# Patient Record
Sex: Female | Born: 1945 | Race: White | Hispanic: No | Marital: Married | Smoking: Former smoker
Health system: Southern US, Community
[De-identification: ages and names within clinical notes are randomized; demographics above are authoritative.]

## PROBLEM LIST (undated history)

## (undated) DIAGNOSIS — F419 Anxiety disorder, unspecified: Secondary | ICD-10-CM

## (undated) DIAGNOSIS — N289 Disorder of kidney and ureter, unspecified: Secondary | ICD-10-CM

## (undated) DIAGNOSIS — M5137 Other intervertebral disc degeneration, lumbosacral region: Secondary | ICD-10-CM

## (undated) DIAGNOSIS — I10 Essential (primary) hypertension: Secondary | ICD-10-CM

## (undated) DIAGNOSIS — K219 Gastro-esophageal reflux disease without esophagitis: Secondary | ICD-10-CM

## (undated) DIAGNOSIS — Z961 Presence of intraocular lens: Secondary | ICD-10-CM

## (undated) DIAGNOSIS — F17200 Nicotine dependence, unspecified, uncomplicated: Secondary | ICD-10-CM

## (undated) DIAGNOSIS — E669 Obesity, unspecified: Secondary | ICD-10-CM

## (undated) DIAGNOSIS — E785 Hyperlipidemia, unspecified: Secondary | ICD-10-CM

## (undated) DIAGNOSIS — C4491 Basal cell carcinoma of skin, unspecified: Secondary | ICD-10-CM

## (undated) DIAGNOSIS — R5381 Other malaise: Secondary | ICD-10-CM

## (undated) DIAGNOSIS — R7309 Other abnormal glucose: Secondary | ICD-10-CM

## (undated) DIAGNOSIS — K831 Obstruction of bile duct: Secondary | ICD-10-CM

## (undated) DIAGNOSIS — Z860100 Personal history of colon polyps, unspecified: Secondary | ICD-10-CM

## (undated) DIAGNOSIS — I708 Atherosclerosis of other arteries: Secondary | ICD-10-CM

## (undated) DIAGNOSIS — L219 Seborrheic dermatitis, unspecified: Secondary | ICD-10-CM

## (undated) DIAGNOSIS — R0989 Other specified symptoms and signs involving the circulatory and respiratory systems: Secondary | ICD-10-CM

## (undated) DIAGNOSIS — I700 Atherosclerosis of aorta: Secondary | ICD-10-CM

## (undated) DIAGNOSIS — F32A Depression, unspecified: Secondary | ICD-10-CM

## (undated) DIAGNOSIS — F329 Major depressive disorder, single episode, unspecified: Secondary | ICD-10-CM

## (undated) DIAGNOSIS — R197 Diarrhea, unspecified: Secondary | ICD-10-CM

## (undated) DIAGNOSIS — M1990 Unspecified osteoarthritis, unspecified site: Secondary | ICD-10-CM

## (undated) DIAGNOSIS — K8591 Acute pancreatitis with uninfected necrosis, unspecified: Secondary | ICD-10-CM

## (undated) DIAGNOSIS — Z8489 Family history of other specified conditions: Secondary | ICD-10-CM

## (undated) DIAGNOSIS — I2510 Atherosclerotic heart disease of native coronary artery without angina pectoris: Secondary | ICD-10-CM

## (undated) DIAGNOSIS — K859 Acute pancreatitis without necrosis or infection, unspecified: Secondary | ICD-10-CM

## (undated) DIAGNOSIS — K5730 Diverticulosis of large intestine without perforation or abscess without bleeding: Secondary | ICD-10-CM

## (undated) DIAGNOSIS — H3530 Unspecified macular degeneration: Secondary | ICD-10-CM

## (undated) DIAGNOSIS — Z8719 Personal history of other diseases of the digestive system: Secondary | ICD-10-CM

## (undated) DIAGNOSIS — K648 Other hemorrhoids: Secondary | ICD-10-CM

## (undated) DIAGNOSIS — N1831 Chronic kidney disease, stage 3a: Secondary | ICD-10-CM

## (undated) DIAGNOSIS — R0609 Other forms of dyspnea: Secondary | ICD-10-CM

## (undated) DIAGNOSIS — D126 Benign neoplasm of colon, unspecified: Secondary | ICD-10-CM

## (undated) DIAGNOSIS — R5383 Other fatigue: Secondary | ICD-10-CM

## (undated) DIAGNOSIS — M51379 Other intervertebral disc degeneration, lumbosacral region without mention of lumbar back pain or lower extremity pain: Secondary | ICD-10-CM

## (undated) DIAGNOSIS — E119 Type 2 diabetes mellitus without complications: Secondary | ICD-10-CM

## (undated) HISTORY — DX: Anxiety disorder, unspecified: F41.9

## (undated) HISTORY — PX: EYE SURGERY: SHX253

## (undated) HISTORY — DX: Unspecified macular degeneration: H35.30

## (undated) HISTORY — DX: Nicotine dependence, unspecified, uncomplicated: F17.200

## (undated) HISTORY — DX: Basal cell carcinoma of skin, unspecified: C44.91

## (undated) HISTORY — PX: KNEE SURGERY: SHX244

## (undated) HISTORY — DX: Other intervertebral disc degeneration, lumbosacral region without mention of lumbar back pain or lower extremity pain: M51.379

## (undated) HISTORY — DX: Essential (primary) hypertension: I10

## (undated) HISTORY — DX: Other intervertebral disc degeneration, lumbosacral region: M51.37

## (undated) HISTORY — DX: Other hemorrhoids: K64.8

## (undated) HISTORY — PX: DILATION AND CURETTAGE OF UTERUS: SHX78

## (undated) HISTORY — PX: CHOLECYSTECTOMY: SHX55

## (undated) HISTORY — DX: Hyperlipidemia, unspecified: E78.5

## (undated) HISTORY — DX: Atherosclerotic heart disease of native coronary artery without angina pectoris: I25.10

## (undated) HISTORY — PX: OTHER SURGICAL HISTORY: SHX169

## (undated) HISTORY — PX: JOINT REPLACEMENT: SHX530

## (undated) HISTORY — DX: Other forms of dyspnea: R06.09

## (undated) HISTORY — DX: Obesity, unspecified: E66.9

## (undated) HISTORY — DX: Diverticulosis of large intestine without perforation or abscess without bleeding: K57.30

## (undated) HISTORY — DX: Other malaise: R53.81

## (undated) HISTORY — DX: Depression, unspecified: F32.A

## (undated) HISTORY — DX: Seborrheic dermatitis, unspecified: L21.9

## (undated) HISTORY — DX: Benign neoplasm of colon, unspecified: D12.6

## (undated) HISTORY — DX: Obstruction of bile duct: K83.1

## (undated) HISTORY — DX: Disorder of kidney and ureter, unspecified: N28.9

## (undated) HISTORY — DX: Other fatigue: R53.83

## (undated) HISTORY — DX: Other specified symptoms and signs involving the circulatory and respiratory systems: R09.89

## (undated) HISTORY — DX: Other abnormal glucose: R73.09

## (undated) HISTORY — DX: Acute pancreatitis without necrosis or infection, unspecified: K85.90

## (undated) HISTORY — DX: Gastro-esophageal reflux disease without esophagitis: K21.9

## (undated) HISTORY — DX: Major depressive disorder, single episode, unspecified: F32.9

---

## 1980-04-07 HISTORY — PX: TUBAL LIGATION: SHX77

## 2004-04-07 HISTORY — PX: CORONARY ANGIOPLASTY: SHX604

## 2004-04-07 HISTORY — PX: CARDIAC CATHETERIZATION: SHX172

## 2006-03-04 ENCOUNTER — Other Ambulatory Visit: Payer: Self-pay

## 2006-03-06 ENCOUNTER — Ambulatory Visit: Payer: Self-pay | Admitting: Specialist

## 2006-07-17 ENCOUNTER — Other Ambulatory Visit: Payer: Self-pay

## 2006-07-18 ENCOUNTER — Inpatient Hospital Stay: Payer: Self-pay | Admitting: Internal Medicine

## 2006-12-29 ENCOUNTER — Ambulatory Visit: Payer: Self-pay | Admitting: Specialist

## 2008-04-07 HISTORY — PX: KNEE SURGERY: SHX244

## 2009-01-15 ENCOUNTER — Ambulatory Visit: Payer: Self-pay | Admitting: Gastroenterology

## 2009-01-15 LAB — HM COLONOSCOPY

## 2009-07-26 ENCOUNTER — Ambulatory Visit: Payer: Self-pay | Admitting: Family Medicine

## 2010-04-07 HISTORY — PX: OTHER SURGICAL HISTORY: SHX169

## 2011-02-06 ENCOUNTER — Ambulatory Visit: Payer: Self-pay | Admitting: Family Medicine

## 2011-02-06 LAB — HM MAMMOGRAPHY: HM Mammogram: NEGATIVE

## 2011-05-01 DIAGNOSIS — L219 Seborrheic dermatitis, unspecified: Secondary | ICD-10-CM | POA: Diagnosis not present

## 2011-05-01 DIAGNOSIS — L819 Disorder of pigmentation, unspecified: Secondary | ICD-10-CM | POA: Diagnosis not present

## 2011-05-01 DIAGNOSIS — L570 Actinic keratosis: Secondary | ICD-10-CM | POA: Diagnosis not present

## 2011-05-30 DIAGNOSIS — H353290 Exudative age-related macular degeneration, unspecified eye, stage unspecified: Secondary | ICD-10-CM | POA: Diagnosis not present

## 2011-06-16 DIAGNOSIS — H353290 Exudative age-related macular degeneration, unspecified eye, stage unspecified: Secondary | ICD-10-CM | POA: Diagnosis not present

## 2011-07-07 DIAGNOSIS — I209 Angina pectoris, unspecified: Secondary | ICD-10-CM | POA: Diagnosis not present

## 2011-07-07 DIAGNOSIS — E782 Mixed hyperlipidemia: Secondary | ICD-10-CM | POA: Diagnosis not present

## 2011-07-07 DIAGNOSIS — I119 Hypertensive heart disease without heart failure: Secondary | ICD-10-CM | POA: Diagnosis not present

## 2011-07-07 DIAGNOSIS — I495 Sick sinus syndrome: Secondary | ICD-10-CM | POA: Diagnosis not present

## 2011-07-16 DIAGNOSIS — E782 Mixed hyperlipidemia: Secondary | ICD-10-CM | POA: Diagnosis not present

## 2011-07-16 DIAGNOSIS — I209 Angina pectoris, unspecified: Secondary | ICD-10-CM | POA: Diagnosis not present

## 2011-07-16 DIAGNOSIS — R0602 Shortness of breath: Secondary | ICD-10-CM | POA: Diagnosis not present

## 2011-07-16 DIAGNOSIS — I119 Hypertensive heart disease without heart failure: Secondary | ICD-10-CM | POA: Diagnosis not present

## 2011-07-16 DIAGNOSIS — I495 Sick sinus syndrome: Secondary | ICD-10-CM | POA: Diagnosis not present

## 2011-07-21 DIAGNOSIS — H35019 Changes in retinal vascular appearance, unspecified eye: Secondary | ICD-10-CM | POA: Diagnosis not present

## 2011-07-21 DIAGNOSIS — H353290 Exudative age-related macular degeneration, unspecified eye, stage unspecified: Secondary | ICD-10-CM | POA: Diagnosis not present

## 2011-08-05 DIAGNOSIS — F329 Major depressive disorder, single episode, unspecified: Secondary | ICD-10-CM | POA: Diagnosis not present

## 2011-08-05 DIAGNOSIS — R7309 Other abnormal glucose: Secondary | ICD-10-CM | POA: Diagnosis not present

## 2011-08-05 DIAGNOSIS — I10 Essential (primary) hypertension: Secondary | ICD-10-CM | POA: Diagnosis not present

## 2011-08-05 DIAGNOSIS — E7800 Pure hypercholesterolemia, unspecified: Secondary | ICD-10-CM | POA: Diagnosis not present

## 2011-08-05 DIAGNOSIS — F3289 Other specified depressive episodes: Secondary | ICD-10-CM | POA: Diagnosis not present

## 2011-08-20 DIAGNOSIS — E782 Mixed hyperlipidemia: Secondary | ICD-10-CM | POA: Diagnosis not present

## 2011-08-20 DIAGNOSIS — R7309 Other abnormal glucose: Secondary | ICD-10-CM | POA: Diagnosis not present

## 2011-09-08 DIAGNOSIS — H353290 Exudative age-related macular degeneration, unspecified eye, stage unspecified: Secondary | ICD-10-CM | POA: Diagnosis not present

## 2011-09-23 DIAGNOSIS — I10 Essential (primary) hypertension: Secondary | ICD-10-CM | POA: Diagnosis not present

## 2011-09-23 DIAGNOSIS — I059 Rheumatic mitral valve disease, unspecified: Secondary | ICD-10-CM | POA: Diagnosis not present

## 2011-09-23 DIAGNOSIS — E782 Mixed hyperlipidemia: Secondary | ICD-10-CM | POA: Diagnosis not present

## 2011-09-23 DIAGNOSIS — K3189 Other diseases of stomach and duodenum: Secondary | ICD-10-CM | POA: Diagnosis not present

## 2011-09-23 DIAGNOSIS — R1013 Epigastric pain: Secondary | ICD-10-CM | POA: Diagnosis not present

## 2011-10-13 DIAGNOSIS — R7309 Other abnormal glucose: Secondary | ICD-10-CM | POA: Diagnosis not present

## 2011-10-13 DIAGNOSIS — F3289 Other specified depressive episodes: Secondary | ICD-10-CM | POA: Diagnosis not present

## 2011-10-13 DIAGNOSIS — M5430 Sciatica, unspecified side: Secondary | ICD-10-CM | POA: Diagnosis not present

## 2011-10-13 DIAGNOSIS — M5137 Other intervertebral disc degeneration, lumbosacral region: Secondary | ICD-10-CM | POA: Diagnosis not present

## 2011-10-13 DIAGNOSIS — H353290 Exudative age-related macular degeneration, unspecified eye, stage unspecified: Secondary | ICD-10-CM | POA: Diagnosis not present

## 2011-10-13 DIAGNOSIS — F329 Major depressive disorder, single episode, unspecified: Secondary | ICD-10-CM | POA: Diagnosis not present

## 2011-10-20 ENCOUNTER — Ambulatory Visit: Payer: Self-pay | Admitting: Family Medicine

## 2011-10-20 DIAGNOSIS — M5137 Other intervertebral disc degeneration, lumbosacral region: Secondary | ICD-10-CM | POA: Diagnosis not present

## 2011-10-20 DIAGNOSIS — M5430 Sciatica, unspecified side: Secondary | ICD-10-CM | POA: Diagnosis not present

## 2011-11-24 DIAGNOSIS — H353290 Exudative age-related macular degeneration, unspecified eye, stage unspecified: Secondary | ICD-10-CM | POA: Diagnosis not present

## 2012-01-02 ENCOUNTER — Telehealth: Payer: Self-pay

## 2012-01-02 NOTE — Telephone Encounter (Signed)
Call --- the next step is to see an orthopedist.  Does she have an orthopedist or has she recently seen an orthopedist for her symptoms?  KMS

## 2012-01-02 NOTE — Telephone Encounter (Signed)
Dr. Katrinka Blazing: The last time she saw you in [place], she was having a sciatic nerve problem with foot tingling and it has not gone away. She may need an MRI on her back. Please call her at [phone]. She is not a patient of [facility] at this time.

## 2012-01-03 NOTE — Telephone Encounter (Signed)
Pt saw Dr Jeral Fruit in Nov of 2011.

## 2012-01-05 NOTE — Telephone Encounter (Signed)
Dr Katrinka Blazing, Dr Jeral Fruit is a NS, do we need to refer her to an ortho specialist?

## 2012-01-06 NOTE — Telephone Encounter (Signed)
Call ---patient will need referral to ortho to start with or she can try to contact [facility]'s office for an appointment. I cannot make referral because I do not have her records at [facility] and she has not established as a patient at this office yet.   She can either: 1.  call [facility] for referral to ortho for persistent sciatica pain.  2. See me for appointment or at Urgent Care in upcoming 1-2 weeks.  KMS

## 2012-01-08 NOTE — Telephone Encounter (Signed)
Spoke w/pt and gave her options from Dr Katrinka Blazing. Pt stated she will prob start by calling Dr Cassandria Santee office and see if she can get an appt there. She will CB for Dr Michaelle Copas hours/appts if needed.

## 2012-01-09 DIAGNOSIS — H353290 Exudative age-related macular degeneration, unspecified eye, stage unspecified: Secondary | ICD-10-CM | POA: Diagnosis not present

## 2012-01-28 DIAGNOSIS — L821 Other seborrheic keratosis: Secondary | ICD-10-CM | POA: Diagnosis not present

## 2012-01-28 DIAGNOSIS — L570 Actinic keratosis: Secondary | ICD-10-CM | POA: Diagnosis not present

## 2012-01-28 DIAGNOSIS — L219 Seborrheic dermatitis, unspecified: Secondary | ICD-10-CM | POA: Diagnosis not present

## 2012-02-09 DIAGNOSIS — M545 Low back pain, unspecified: Secondary | ICD-10-CM | POA: Diagnosis not present

## 2012-02-09 DIAGNOSIS — IMO0002 Reserved for concepts with insufficient information to code with codable children: Secondary | ICD-10-CM | POA: Diagnosis not present

## 2012-02-11 ENCOUNTER — Other Ambulatory Visit: Payer: Self-pay | Admitting: Neurosurgery

## 2012-02-11 DIAGNOSIS — M5410 Radiculopathy, site unspecified: Secondary | ICD-10-CM

## 2012-02-11 DIAGNOSIS — M549 Dorsalgia, unspecified: Secondary | ICD-10-CM

## 2012-02-17 ENCOUNTER — Ambulatory Visit
Admission: RE | Admit: 2012-02-17 | Discharge: 2012-02-17 | Disposition: A | Discharge status: Home / Self Care | Payer: Medicare Other | Source: Ambulatory Visit | Attending: Neurosurgery | Admitting: Neurosurgery

## 2012-02-17 DIAGNOSIS — M5410 Radiculopathy, site unspecified: Secondary | ICD-10-CM

## 2012-02-17 DIAGNOSIS — M549 Dorsalgia, unspecified: Secondary | ICD-10-CM

## 2012-02-19 ENCOUNTER — Ambulatory Visit
Admission: RE | Admit: 2012-02-19 | Discharge: 2012-02-19 | Disposition: A | Discharge status: Home / Self Care | Payer: Medicare Other | Source: Ambulatory Visit | Attending: Neurosurgery | Admitting: Neurosurgery

## 2012-02-19 VITALS — BP 117/54 | HR 58 | Ht 68.0 in | Wt 240.0 lb

## 2012-02-19 DIAGNOSIS — M545 Low back pain, unspecified: Secondary | ICD-10-CM | POA: Diagnosis not present

## 2012-02-19 DIAGNOSIS — M5126 Other intervertebral disc displacement, lumbar region: Secondary | ICD-10-CM | POA: Diagnosis not present

## 2012-02-19 DIAGNOSIS — M549 Dorsalgia, unspecified: Secondary | ICD-10-CM

## 2012-02-19 MED ORDER — IOHEXOL 180 MG/ML  SOLN
15.0000 mL | Freq: Once | INTRAMUSCULAR | Status: AC | PRN
  Administered 2012-02-19: 15 mL via INTRATHECAL

## 2012-02-19 MED ORDER — DIAZEPAM 5 MG PO TABS
10.0000 mg | ORAL_TABLET | Freq: Once | ORAL | Status: AC
  Administered 2012-02-19: 5 mg via ORAL

## 2012-02-19 NOTE — Progress Notes (Signed)
Pt states she has been off of lexapro for the past 2 days. dd

## 2012-02-23 DIAGNOSIS — H353290 Exudative age-related macular degeneration, unspecified eye, stage unspecified: Secondary | ICD-10-CM | POA: Diagnosis not present

## 2012-03-10 DIAGNOSIS — K2100 Gastro-esophageal reflux disease with esophagitis, without bleeding: Secondary | ICD-10-CM | POA: Diagnosis not present

## 2012-03-10 DIAGNOSIS — I10 Essential (primary) hypertension: Secondary | ICD-10-CM | POA: Diagnosis not present

## 2012-03-10 DIAGNOSIS — E785 Hyperlipidemia, unspecified: Secondary | ICD-10-CM | POA: Diagnosis not present

## 2012-03-10 DIAGNOSIS — I2510 Atherosclerotic heart disease of native coronary artery without angina pectoris: Secondary | ICD-10-CM | POA: Diagnosis not present

## 2012-03-15 DIAGNOSIS — M545 Low back pain, unspecified: Secondary | ICD-10-CM | POA: Diagnosis not present

## 2012-03-15 DIAGNOSIS — M47817 Spondylosis without myelopathy or radiculopathy, lumbosacral region: Secondary | ICD-10-CM | POA: Diagnosis not present

## 2012-03-15 DIAGNOSIS — IMO0002 Reserved for concepts with insufficient information to code with codable children: Secondary | ICD-10-CM | POA: Diagnosis not present

## 2012-03-16 ENCOUNTER — Encounter: Payer: Self-pay | Admitting: Family Medicine

## 2012-03-16 ENCOUNTER — Ambulatory Visit (INDEPENDENT_AMBULATORY_CARE_PROVIDER_SITE_OTHER): Payer: Medicare Other | Admitting: Family Medicine

## 2012-03-16 VITALS — BP 140/84 | HR 62 | Temp 99.1°F | Resp 18 | Ht 68.5 in | Wt 240.0 lb

## 2012-03-16 DIAGNOSIS — Z23 Encounter for immunization: Secondary | ICD-10-CM | POA: Diagnosis not present

## 2012-03-16 DIAGNOSIS — R7309 Other abnormal glucose: Secondary | ICD-10-CM

## 2012-03-16 DIAGNOSIS — F329 Major depressive disorder, single episode, unspecified: Secondary | ICD-10-CM | POA: Diagnosis not present

## 2012-03-16 DIAGNOSIS — F3289 Other specified depressive episodes: Secondary | ICD-10-CM | POA: Diagnosis not present

## 2012-03-16 DIAGNOSIS — E7800 Pure hypercholesterolemia, unspecified: Secondary | ICD-10-CM | POA: Diagnosis not present

## 2012-03-16 DIAGNOSIS — Z1239 Encounter for other screening for malignant neoplasm of breast: Secondary | ICD-10-CM

## 2012-03-16 DIAGNOSIS — F32A Depression, unspecified: Secondary | ICD-10-CM

## 2012-03-16 DIAGNOSIS — I2510 Atherosclerotic heart disease of native coronary artery without angina pectoris: Secondary | ICD-10-CM | POA: Insufficient documentation

## 2012-03-16 DIAGNOSIS — I10 Essential (primary) hypertension: Secondary | ICD-10-CM

## 2012-03-16 LAB — CBC WITH DIFFERENTIAL/PLATELET
Eosinophils Relative: 3 % (ref 0–5)
HCT: 37.6 % (ref 36.0–46.0)
Lymphocytes Relative: 26 % (ref 12–46)
Lymphs Abs: 1.7 10*3/uL (ref 0.7–4.0)
MCV: 88.1 fL (ref 78.0–100.0)
Monocytes Absolute: 0.6 10*3/uL (ref 0.1–1.0)
Monocytes Relative: 10 % (ref 3–12)
RBC: 4.27 MIL/uL (ref 3.87–5.11)
WBC: 6.4 10*3/uL (ref 4.0–10.5)

## 2012-03-16 LAB — LIPID PANEL
Cholesterol: 209 mg/dL — ABNORMAL HIGH (ref 0–200)
HDL: 51 mg/dL (ref >39)
Total CHOL/HDL Ratio: 4.1 Ratio
Triglycerides: 138 mg/dL (ref <150)
VLDL: 28 mg/dL (ref 0–40)

## 2012-03-16 LAB — COMPREHENSIVE METABOLIC PANEL
Albumin: 4.5 g/dL (ref 3.5–5.2)
Alkaline Phosphatase: 61 U/L (ref 39–117)
CO2: 28 mEq/L (ref 19–32)
Chloride: 100 mEq/L (ref 96–112)
Glucose, Bld: 102 mg/dL — ABNORMAL HIGH (ref 70–99)
Potassium: 5.4 mEq/L — ABNORMAL HIGH (ref 3.5–5.3)
Sodium: 136 mEq/L (ref 135–145)
Total Protein: 6.9 g/dL (ref 6.0–8.3)

## 2012-03-16 NOTE — Assessment & Plan Note (Signed)
Administered in office.

## 2012-03-16 NOTE — Assessment & Plan Note (Signed)
Stable; s/p recent cardiology follow-up last week.  Asymptomatic.

## 2012-03-16 NOTE — Assessment & Plan Note (Signed)
Stable despite recent marital stressors; counseling provided in office; no changes in medication; good family support from children.  Consider further psychotherapy if emotionally declines.

## 2012-03-16 NOTE — Assessment & Plan Note (Signed)
Moderately controlled; Gail Burns added HCTZ this week; obtain labs.  Continue current medications.

## 2012-03-16 NOTE — Assessment & Plan Note (Signed)
Controlled; no changes to management; obtain labs.

## 2012-03-16 NOTE — Assessment & Plan Note (Signed)
Refer for mammogram.  

## 2012-03-16 NOTE — Patient Instructions (Addendum)
1. Pure hypercholesterolemia  CBC with Differential, Comprehensive metabolic panel, Lipid panel, CK  2. Other abnormal glucose  Hemoglobin A1c, Comprehensive metabolic panel  3. Essential hypertension, benign    4. Coronary artery disease    5. Depression    6. Need for prophylactic vaccination and inoculation against influenza  Flu vaccine greater than or equal to 68yo preservative free IM  7. Screening for breast cancer  MM Digital Screening

## 2012-03-16 NOTE — Progress Notes (Signed)
[address]   [place], [place]  [address]   [phone]  Subjective:    Patient ID: Gail Burns, female    DOB: [birth year], 66 y.o.   MRN: [id number]  HPIThis 66 y.o. female presents to establish care and for six month follow-up:  1. Sciatica:  Saw Dr. Phineas Semen; ordered myelogram and CT scan; follow-up yesterday.  +DDD lumbar; recommended surgery.  Discussed steroid injections x 3; has undergone two injections in past; RLE; R calf feels really tight.  Considering surgery.  Recommended weight loss.    2.  HTN:  Six month follow-up; no changes to management made at last visit ;recent BP at cardiology/[facility]alski's office of 145/80s.  Started diuretic today for elevated blood pressure per Dr. Gwen Pounds.  Denies CP/palp/SOB/leg swelling. Denies HA/dizziness/focal weakness/paresthesias.  3. Hyperlipidemia:  Six month follow-up; no changes to management made at last visit. Reports compliance with medication; good tolerance to medication; good symptom control.   4.  Glucose Intolerance: continues with attempts of dietary modification; no formal exercise for past two months.  Denies polydipsia or polyphagia or polyuria.  5. Depression:  Stable despite major marital stressors.  Husband charged for two attempts of picking up prostitute in past six months.  Husband told patient of charges.  Does not trust husband at all now. Not sexually active with husband due to atrophic vaginitis.  Very angry with husband.  Kicked husband out with first charges but had him return home after a few weeks.  Second charge, kicked him out of the bed for one month.  Married x 43+years; cannot live without him; loves him. Has not told siblings.  Has told children.    6 CAD:  S/p cardiology consultation last week; started diuretic.  Gwen Pounds.  No cardiovascular studies.  7. Breast cancer screening: due for mammogram.  8.  Flu vaccine: agreeable.   Review of Systems  Constitutional: Negative for fever, chills, diaphoresis  and fatigue.  Respiratory: Positive for shortness of breath.        No worsening DOE.  Cardiovascular: Negative for chest pain, palpitations and leg swelling.  Gastrointestinal: Negative for vomiting and diarrhea.  Musculoskeletal: Positive for back pain.  Neurological: Positive for numbness. Negative for dizziness, tremors, seizures, syncope, facial asymmetry, speech difficulty, weakness, light-headedness and headaches.  Psychiatric/Behavioral: Positive for dysphoric mood. Negative for suicidal ideas, sleep disturbance and self-injury. The patient is nervous/anxious.         Past Medical History  Diagnosis Date  . Anxiety   . Depression   . Hyperlipidemia   . Hypertension     Past Surgical History  Procedure Date  . Cardiac catheterization 2006    80% blockage    Prior to Admission medications   Medication Sig Start Date End Date Taking? Authorizing Provider  aspirin 81 MG tablet Take 81 mg by mouth daily.   Yes Historical Provider, MD  calcium carbonate (OS-CAL) 600 MG TABS Take 600 mg by mouth 2 (two) times daily with a meal.   Yes Historical Provider, MD  chlorthalidone (HYGROTON) 25 MG tablet Take 25 mg by mouth daily.   Yes Historical Provider, MD  escitalopram (LEXAPRO) 20 MG tablet Take 20 mg by mouth daily.   Yes Historical Provider, MD  esomeprazole (NEXIUM) 40 MG capsule Take 40 mg by mouth daily before breakfast.   Yes Historical Provider, MD  fenofibrate (TRICOR) 145 MG tablet Take 145 mg by mouth daily.   Yes Historical Provider, MD  KRILL OIL 1000 MG CAPS Take  by mouth.   Yes Historical Provider, MD  lisinopril (PRINIVIL,ZESTRIL) 20 MG tablet Take 20 mg by mouth daily.   Yes Historical Provider, MD  meloxicam (MOBIC) 15 MG tablet Take 15 mg by mouth daily.   Yes Historical Provider, MD  rosuvastatin (CRESTOR) 40 MG tablet Take 40 mg by mouth daily.   Yes Historical Provider, MD    Allergies  Allergen Reactions  . Augmentin (Amoxicillin-Pot Clavulanate) Hives      History   Social History  . Marital Status: Married    Spouse Name: N/A    Number of Children: N/A  . Years of Education: N/A   Occupational History  . Not on file.   Social History Main Topics  . Smoking status: Former Smoker -- 0.5 packs/day for 30 years    Types: Cigarettes    Quit date: 02/18/1990  . Smokeless tobacco: Not on file  . Alcohol Use: Yes  . Drug Use: No  . Sexually Active: Not Currently     Comment: due to atrophic vaginitis   Other Topics Concern  . Not on file   Social History Narrative   Marital status: married   Children: live in [place]       Family History  Problem Relation Age of Onset  . Cancer Mother   . Heart disease Mother   . Heart disease Father   . Heart disease Sister   . Heart disease Brother   . Heart disease Maternal Grandfather     Objective:   Physical Exam  Nursing note and vitals reviewed. Constitutional: She is oriented to person, place, and time. She appears well-developed and well-nourished. No distress.  Eyes: Conjunctivae normal are normal. Pupils are equal, round, and reactive to light.  Neck: Normal range of motion. Neck supple. No thyromegaly present.  Cardiovascular: Normal rate, regular rhythm, normal heart sounds and intact distal pulses.  Exam reveals no gallop and no friction rub.   No murmur heard. Pulmonary/Chest: Effort normal and breath sounds normal. She has no wheezes. She has no rales.  Abdominal: Soft. Bowel sounds are normal.  Lymphadenopathy:    She has no cervical adenopathy.  Neurological: She is alert and oriented to person, place, and time. No cranial nerve deficit. She exhibits normal muscle tone. Coordination normal.  Skin: She is not diaphoretic.  Psychiatric: She has a normal mood and affect. Her behavior is normal. Thought content normal.     INFLUENZA VACCINE ADMINISTERED.     Assessment & Plan:   1. Pure hypercholesterolemia  CBC with Differential, Comprehensive metabolic panel,  Lipid panel, CK  2. Other abnormal glucose  Hemoglobin A1c, Comprehensive metabolic panel  3. Essential hypertension, benign    4. Coronary artery disease    5. Depression    6. Need for prophylactic vaccination and inoculation against influenza  Flu vaccine greater than or equal to 3yo preservative free IM  7. Screening for breast cancer  MM Digital Screening

## 2012-03-16 NOTE — Assessment & Plan Note (Signed)
Stable; obtain labs; continue with dietary modification.

## 2012-03-21 ENCOUNTER — Encounter: Payer: Self-pay | Admitting: Radiology

## 2012-03-24 DIAGNOSIS — L819 Disorder of pigmentation, unspecified: Secondary | ICD-10-CM | POA: Diagnosis not present

## 2012-03-24 DIAGNOSIS — L570 Actinic keratosis: Secondary | ICD-10-CM | POA: Diagnosis not present

## 2012-03-24 DIAGNOSIS — L988 Other specified disorders of the skin and subcutaneous tissue: Secondary | ICD-10-CM | POA: Diagnosis not present

## 2012-04-05 ENCOUNTER — Inpatient Hospital Stay: Payer: Self-pay | Admitting: Internal Medicine

## 2012-04-05 DIAGNOSIS — F3289 Other specified depressive episodes: Secondary | ICD-10-CM | POA: Diagnosis present

## 2012-04-05 DIAGNOSIS — H3530 Unspecified macular degeneration: Secondary | ICD-10-CM | POA: Diagnosis present

## 2012-04-05 DIAGNOSIS — Z7982 Long term (current) use of aspirin: Secondary | ICD-10-CM | POA: Diagnosis not present

## 2012-04-05 DIAGNOSIS — K8020 Calculus of gallbladder without cholecystitis without obstruction: Secondary | ICD-10-CM | POA: Diagnosis not present

## 2012-04-05 DIAGNOSIS — Z881 Allergy status to other antibiotic agents status: Secondary | ICD-10-CM | POA: Diagnosis not present

## 2012-04-05 DIAGNOSIS — Z87891 Personal history of nicotine dependence: Secondary | ICD-10-CM | POA: Diagnosis not present

## 2012-04-05 DIAGNOSIS — Z79899 Other long term (current) drug therapy: Secondary | ICD-10-CM | POA: Diagnosis not present

## 2012-04-05 DIAGNOSIS — F329 Major depressive disorder, single episode, unspecified: Secondary | ICD-10-CM | POA: Diagnosis present

## 2012-04-05 DIAGNOSIS — I2510 Atherosclerotic heart disease of native coronary artery without angina pectoris: Secondary | ICD-10-CM | POA: Diagnosis not present

## 2012-04-05 DIAGNOSIS — M549 Dorsalgia, unspecified: Secondary | ICD-10-CM | POA: Diagnosis present

## 2012-04-05 DIAGNOSIS — R9431 Abnormal electrocardiogram [ECG] [EKG]: Secondary | ICD-10-CM | POA: Diagnosis not present

## 2012-04-05 DIAGNOSIS — R112 Nausea with vomiting, unspecified: Secondary | ICD-10-CM | POA: Diagnosis not present

## 2012-04-05 DIAGNOSIS — E781 Pure hyperglyceridemia: Secondary | ICD-10-CM | POA: Diagnosis present

## 2012-04-05 DIAGNOSIS — Z9861 Coronary angioplasty status: Secondary | ICD-10-CM | POA: Diagnosis not present

## 2012-04-05 DIAGNOSIS — E669 Obesity, unspecified: Secondary | ICD-10-CM | POA: Diagnosis present

## 2012-04-05 DIAGNOSIS — M129 Arthropathy, unspecified: Secondary | ICD-10-CM | POA: Diagnosis present

## 2012-04-05 DIAGNOSIS — K5730 Diverticulosis of large intestine without perforation or abscess without bleeding: Secondary | ICD-10-CM | POA: Diagnosis present

## 2012-04-05 DIAGNOSIS — K859 Acute pancreatitis without necrosis or infection, unspecified: Secondary | ICD-10-CM | POA: Diagnosis not present

## 2012-04-05 DIAGNOSIS — I10 Essential (primary) hypertension: Secondary | ICD-10-CM | POA: Diagnosis not present

## 2012-04-05 DIAGNOSIS — R1013 Epigastric pain: Secondary | ICD-10-CM | POA: Diagnosis not present

## 2012-04-05 DIAGNOSIS — Z880 Allergy status to penicillin: Secondary | ICD-10-CM | POA: Diagnosis not present

## 2012-04-05 DIAGNOSIS — F411 Generalized anxiety disorder: Secondary | ICD-10-CM | POA: Diagnosis present

## 2012-04-05 DIAGNOSIS — Z8249 Family history of ischemic heart disease and other diseases of the circulatory system: Secondary | ICD-10-CM | POA: Diagnosis not present

## 2012-04-05 DIAGNOSIS — K648 Other hemorrhoids: Secondary | ICD-10-CM | POA: Diagnosis present

## 2012-04-05 DIAGNOSIS — K8689 Other specified diseases of pancreas: Secondary | ICD-10-CM | POA: Diagnosis not present

## 2012-04-05 DIAGNOSIS — E785 Hyperlipidemia, unspecified: Secondary | ICD-10-CM | POA: Diagnosis not present

## 2012-04-05 LAB — CBC WITH DIFFERENTIAL/PLATELET
Basophil #: 0.1 10*3/uL (ref 0.0–0.1)
Eosinophil #: 0.1 10*3/uL (ref 0.0–0.7)
HGB: 12.7 g/dL (ref 12.0–16.0)
Lymphocyte #: 1 10*3/uL (ref 1.0–3.6)
Lymphocyte %: 7.1 %
MCHC: 33.1 g/dL (ref 32.0–36.0)
Monocyte #: 1.2 x10 3/mm — ABNORMAL HIGH (ref 0.2–0.9)
Neutrophil %: 83.5 %
RDW: 13.3 % (ref 11.5–14.5)

## 2012-04-05 LAB — COMPREHENSIVE METABOLIC PANEL
Anion Gap: 9 (ref 7–16)
BUN: 33 mg/dL — ABNORMAL HIGH (ref 7–18)
Bilirubin,Total: 0.5 mg/dL (ref 0.2–1.0)
Chloride: 105 mmol/L (ref 98–107)
Co2: 26 mmol/L (ref 21–32)
EGFR (African American): 45 — ABNORMAL LOW
EGFR (Non-African Amer.): 39 — ABNORMAL LOW
Glucose: 118 mg/dL — ABNORMAL HIGH (ref 65–99)
Osmolality: 288 (ref 275–301)
Potassium: 4 mmol/L (ref 3.5–5.1)
SGOT(AST): 342 U/L — ABNORMAL HIGH (ref 15–37)
Sodium: 140 mmol/L (ref 136–145)
Total Protein: 7.6 g/dL (ref 6.4–8.2)

## 2012-04-05 LAB — LIPASE, BLOOD: Lipase: >3000 U/L (ref 73–393)

## 2012-04-05 LAB — CK-MB: CK-MB: <0.5 ng/mL — ABNORMAL LOW (ref 0.5–3.6)

## 2012-04-06 DIAGNOSIS — E785 Hyperlipidemia, unspecified: Secondary | ICD-10-CM | POA: Diagnosis not present

## 2012-04-06 DIAGNOSIS — K859 Acute pancreatitis without necrosis or infection, unspecified: Secondary | ICD-10-CM | POA: Diagnosis not present

## 2012-04-06 DIAGNOSIS — I2510 Atherosclerotic heart disease of native coronary artery without angina pectoris: Secondary | ICD-10-CM | POA: Diagnosis not present

## 2012-04-06 DIAGNOSIS — I10 Essential (primary) hypertension: Secondary | ICD-10-CM | POA: Diagnosis not present

## 2012-04-06 LAB — CBC WITH DIFFERENTIAL/PLATELET
Basophil %: 0.4 %
HCT: 34.4 % — ABNORMAL LOW (ref 35.0–47.0)
Lymphocyte %: 27.1 %
MCHC: 33.1 g/dL (ref 32.0–36.0)
MCV: 89 fL (ref 80–100)
Monocyte #: 0.7 x10 3/mm (ref 0.2–0.9)
Monocyte %: 10.1 %
Neutrophil #: 4.2 10*3/uL (ref 1.4–6.5)
Neutrophil %: 60.9 %
RBC: 3.85 10*6/uL (ref 3.80–5.20)
RDW: 13.5 % (ref 11.5–14.5)
WBC: 7 10*3/uL (ref 3.6–11.0)

## 2012-04-06 LAB — COMPREHENSIVE METABOLIC PANEL
Alkaline Phosphatase: 78 U/L (ref 50–136)
Anion Gap: 5 — ABNORMAL LOW (ref 7–16)
Calcium, Total: 8.7 mg/dL (ref 8.5–10.1)
Co2: 28 mmol/L (ref 21–32)
EGFR (African American): 54 — ABNORMAL LOW
Glucose: 93 mg/dL (ref 65–99)
Potassium: 4.6 mmol/L (ref 3.5–5.1)
SGOT(AST): 105 U/L — ABNORMAL HIGH (ref 15–37)
SGPT (ALT): 124 U/L — ABNORMAL HIGH (ref 12–78)
Sodium: 144 mmol/L (ref 136–145)

## 2012-04-06 LAB — LIPID PANEL
Cholesterol: 157 mg/dL (ref 0–200)
HDL Cholesterol: 44 mg/dL (ref 40–60)
Ldl Cholesterol, Calc: 75 mg/dL (ref 0–100)
Triglycerides: 192 mg/dL (ref 0–200)
VLDL Cholesterol, Calc: 38 mg/dL (ref 5–40)

## 2012-04-06 LAB — TROPONIN I: Troponin-I: <0.02 ng/mL

## 2012-04-07 DIAGNOSIS — E785 Hyperlipidemia, unspecified: Secondary | ICD-10-CM | POA: Diagnosis not present

## 2012-04-07 DIAGNOSIS — K8020 Calculus of gallbladder without cholecystitis without obstruction: Secondary | ICD-10-CM | POA: Diagnosis not present

## 2012-04-07 DIAGNOSIS — K859 Acute pancreatitis without necrosis or infection, unspecified: Secondary | ICD-10-CM | POA: Diagnosis not present

## 2012-04-07 DIAGNOSIS — I2510 Atherosclerotic heart disease of native coronary artery without angina pectoris: Secondary | ICD-10-CM | POA: Diagnosis not present

## 2012-04-07 DIAGNOSIS — I10 Essential (primary) hypertension: Secondary | ICD-10-CM | POA: Diagnosis not present

## 2012-04-07 HISTORY — PX: CHOLECYSTECTOMY: SHX55

## 2012-04-07 LAB — CBC WITH DIFFERENTIAL/PLATELET
Basophil #: 0 10*3/uL (ref 0.0–0.1)
Basophil %: 0.6 %
Eosinophil #: 0.2 10*3/uL (ref 0.0–0.7)
Eosinophil %: 2.8 %
Lymphocyte #: 1.6 10*3/uL (ref 1.0–3.6)
MCH: 30.1 pg (ref 26.0–34.0)
MCV: 89 fL (ref 80–100)
Monocyte #: 0.7 x10 3/mm (ref 0.2–0.9)
Neutrophil %: 57.2 %
Platelet: 267 10*3/uL (ref 150–440)
RBC: 3.62 10*6/uL — ABNORMAL LOW (ref 3.80–5.20)

## 2012-04-07 LAB — BASIC METABOLIC PANEL
BUN: 17 mg/dL (ref 7–18)
Calcium, Total: 8.4 mg/dL — ABNORMAL LOW (ref 8.5–10.1)
Chloride: 111 mmol/L — ABNORMAL HIGH (ref 98–107)
Co2: 24 mmol/L (ref 21–32)
Creatinine: 0.93 mg/dL (ref 0.60–1.30)
Glucose: 85 mg/dL (ref 65–99)
Sodium: 143 mmol/L (ref 136–145)

## 2012-04-08 DIAGNOSIS — I2510 Atherosclerotic heart disease of native coronary artery without angina pectoris: Secondary | ICD-10-CM | POA: Diagnosis not present

## 2012-04-08 DIAGNOSIS — I10 Essential (primary) hypertension: Secondary | ICD-10-CM | POA: Diagnosis not present

## 2012-04-08 DIAGNOSIS — E785 Hyperlipidemia, unspecified: Secondary | ICD-10-CM | POA: Diagnosis not present

## 2012-04-08 DIAGNOSIS — K8020 Calculus of gallbladder without cholecystitis without obstruction: Secondary | ICD-10-CM | POA: Diagnosis not present

## 2012-04-08 DIAGNOSIS — K859 Acute pancreatitis without necrosis or infection, unspecified: Secondary | ICD-10-CM | POA: Diagnosis not present

## 2012-04-09 DIAGNOSIS — E785 Hyperlipidemia, unspecified: Secondary | ICD-10-CM | POA: Diagnosis not present

## 2012-04-09 DIAGNOSIS — I2510 Atherosclerotic heart disease of native coronary artery without angina pectoris: Secondary | ICD-10-CM | POA: Diagnosis not present

## 2012-04-09 DIAGNOSIS — K859 Acute pancreatitis without necrosis or infection, unspecified: Secondary | ICD-10-CM | POA: Diagnosis not present

## 2012-04-09 DIAGNOSIS — I10 Essential (primary) hypertension: Secondary | ICD-10-CM | POA: Diagnosis not present

## 2012-04-09 LAB — CBC WITH DIFFERENTIAL/PLATELET
Basophil #: 0 10*3/uL (ref 0.0–0.1)
Eosinophil #: 0.2 10*3/uL (ref 0.0–0.7)
Eosinophil %: 3.7 %
HCT: 33.5 % — ABNORMAL LOW (ref 35.0–47.0)
HGB: 11.6 g/dL — ABNORMAL LOW (ref 12.0–16.0)
Lymphocyte %: 23 %
MCH: 30.5 pg (ref 26.0–34.0)
MCV: 89 fL (ref 80–100)
Monocyte #: 0.8 x10 3/mm (ref 0.2–0.9)
Monocyte %: 12.4 %
Platelet: 296 10*3/uL (ref 150–440)
RDW: 13.2 % (ref 11.5–14.5)
WBC: 6.6 10*3/uL (ref 3.6–11.0)

## 2012-04-09 LAB — BASIC METABOLIC PANEL
Anion Gap: 6 — ABNORMAL LOW (ref 7–16)
BUN: 14 mg/dL (ref 7–18)
Calcium, Total: 9.2 mg/dL (ref 8.5–10.1)
Chloride: 111 mmol/L — ABNORMAL HIGH (ref 98–107)
Co2: 25 mmol/L (ref 21–32)
Creatinine: 1 mg/dL (ref 0.60–1.30)
EGFR (African American): >60
EGFR (Non-African Amer.): 58 — ABNORMAL LOW
Osmolality: 284 (ref 275–301)

## 2012-04-12 DIAGNOSIS — H269 Unspecified cataract: Secondary | ICD-10-CM | POA: Diagnosis not present

## 2012-04-12 DIAGNOSIS — H2510 Age-related nuclear cataract, unspecified eye: Secondary | ICD-10-CM | POA: Diagnosis not present

## 2012-04-12 DIAGNOSIS — H353290 Exudative age-related macular degeneration, unspecified eye, stage unspecified: Secondary | ICD-10-CM | POA: Diagnosis not present

## 2012-04-15 ENCOUNTER — Encounter: Payer: Self-pay | Admitting: *Deleted

## 2012-04-16 DIAGNOSIS — K8020 Calculus of gallbladder without cholecystitis without obstruction: Secondary | ICD-10-CM | POA: Diagnosis not present

## 2012-04-16 DIAGNOSIS — K859 Acute pancreatitis without necrosis or infection, unspecified: Secondary | ICD-10-CM | POA: Diagnosis not present

## 2012-04-19 ENCOUNTER — Ambulatory Visit: Payer: Self-pay | Admitting: Surgery

## 2012-04-19 DIAGNOSIS — K5730 Diverticulosis of large intestine without perforation or abscess without bleeding: Secondary | ICD-10-CM | POA: Diagnosis not present

## 2012-04-19 DIAGNOSIS — K8020 Calculus of gallbladder without cholecystitis without obstruction: Secondary | ICD-10-CM | POA: Diagnosis not present

## 2012-04-19 DIAGNOSIS — E785 Hyperlipidemia, unspecified: Secondary | ICD-10-CM | POA: Diagnosis not present

## 2012-04-19 DIAGNOSIS — Z01812 Encounter for preprocedural laboratory examination: Secondary | ICD-10-CM | POA: Diagnosis not present

## 2012-04-19 DIAGNOSIS — K859 Acute pancreatitis without necrosis or infection, unspecified: Secondary | ICD-10-CM | POA: Diagnosis not present

## 2012-04-19 LAB — HEPATIC FUNCTION PANEL A (ARMC)
Albumin: 3.9 g/dL (ref 3.4–5.0)
Alkaline Phosphatase: 79 U/L (ref 50–136)
Bilirubin, Direct: 0.1 mg/dL (ref 0.00–0.20)
Bilirubin,Total: 0.4 mg/dL (ref 0.2–1.0)
SGPT (ALT): 22 U/L (ref 12–78)

## 2012-04-23 ENCOUNTER — Ambulatory Visit: Payer: Self-pay | Admitting: Surgery

## 2012-04-23 DIAGNOSIS — Z881 Allergy status to other antibiotic agents status: Secondary | ICD-10-CM | POA: Diagnosis not present

## 2012-04-23 DIAGNOSIS — E785 Hyperlipidemia, unspecified: Secondary | ICD-10-CM | POA: Diagnosis not present

## 2012-04-23 DIAGNOSIS — I2510 Atherosclerotic heart disease of native coronary artery without angina pectoris: Secondary | ICD-10-CM | POA: Diagnosis not present

## 2012-04-23 DIAGNOSIS — K8010 Calculus of gallbladder with chronic cholecystitis without obstruction: Secondary | ICD-10-CM | POA: Diagnosis not present

## 2012-04-23 DIAGNOSIS — I10 Essential (primary) hypertension: Secondary | ICD-10-CM | POA: Diagnosis not present

## 2012-04-23 DIAGNOSIS — Z87891 Personal history of nicotine dependence: Secondary | ICD-10-CM | POA: Diagnosis not present

## 2012-04-23 DIAGNOSIS — M129 Arthropathy, unspecified: Secondary | ICD-10-CM | POA: Diagnosis not present

## 2012-04-23 DIAGNOSIS — H3530 Unspecified macular degeneration: Secondary | ICD-10-CM | POA: Diagnosis not present

## 2012-04-23 DIAGNOSIS — IMO0002 Reserved for concepts with insufficient information to code with codable children: Secondary | ICD-10-CM | POA: Diagnosis not present

## 2012-04-23 DIAGNOSIS — R002 Palpitations: Secondary | ICD-10-CM | POA: Diagnosis not present

## 2012-04-23 DIAGNOSIS — K8020 Calculus of gallbladder without cholecystitis without obstruction: Secondary | ICD-10-CM | POA: Diagnosis not present

## 2012-04-23 DIAGNOSIS — K859 Acute pancreatitis without necrosis or infection, unspecified: Secondary | ICD-10-CM | POA: Diagnosis not present

## 2012-04-23 DIAGNOSIS — Z7982 Long term (current) use of aspirin: Secondary | ICD-10-CM | POA: Diagnosis not present

## 2012-04-23 DIAGNOSIS — Z79899 Other long term (current) drug therapy: Secondary | ICD-10-CM | POA: Diagnosis not present

## 2012-04-26 LAB — PATHOLOGY REPORT

## 2012-04-27 ENCOUNTER — Telehealth: Payer: Self-pay

## 2012-04-27 MED ORDER — LISINOPRIL 20 MG PO TABS: 20.0000 mg | ORAL_TABLET | Freq: Every day | ORAL | Status: DC

## 2012-04-27 NOTE — Telephone Encounter (Signed)
Patient advised this is called in.

## 2012-04-27 NOTE — Telephone Encounter (Signed)
PT STATES SHE USES [facility] AND IT WILL BE ANOTHER WEEK BEFORE THEY SEND HER LISINOPRIL. WOULD LIKE TO HAVE ENOUGH CALLED IN FOR AT LEAST A WEEK PLEASE CALL [phone]   [facility] ON [address] IN [place]

## 2012-05-18 ENCOUNTER — Encounter: Payer: Self-pay | Admitting: Family Medicine

## 2012-05-18 DIAGNOSIS — E785 Hyperlipidemia, unspecified: Secondary | ICD-10-CM | POA: Diagnosis not present

## 2012-05-18 DIAGNOSIS — I10 Essential (primary) hypertension: Secondary | ICD-10-CM | POA: Diagnosis not present

## 2012-05-18 DIAGNOSIS — K2100 Gastro-esophageal reflux disease with esophagitis, without bleeding: Secondary | ICD-10-CM | POA: Diagnosis not present

## 2012-05-18 DIAGNOSIS — I2510 Atherosclerotic heart disease of native coronary artery without angina pectoris: Secondary | ICD-10-CM | POA: Diagnosis not present

## 2012-05-24 DIAGNOSIS — H353290 Exudative age-related macular degeneration, unspecified eye, stage unspecified: Secondary | ICD-10-CM | POA: Diagnosis not present

## 2012-07-06 ENCOUNTER — Telehealth: Payer: Self-pay

## 2012-07-06 MED ORDER — MELOXICAM 15 MG PO TABS: 15.0000 mg | ORAL_TABLET | Freq: Every day | ORAL | Status: DC

## 2012-07-06 MED ORDER — ROSUVASTATIN CALCIUM 40 MG PO TABS: 40.0000 mg | ORAL_TABLET | Freq: Every day | ORAL | Status: DC

## 2012-07-06 MED ORDER — FENOFIBRATE 145 MG PO TABS: 145.0000 mg | ORAL_TABLET | Freq: Every day | ORAL | Status: DC

## 2012-07-06 MED ORDER — ESOMEPRAZOLE MAGNESIUM 40 MG PO CPDR: 40.0000 mg | DELAYED_RELEASE_CAPSULE | Freq: Every day | ORAL | Status: DC

## 2012-07-06 MED ORDER — ESCITALOPRAM OXALATE 20 MG PO TABS: 20.0000 mg | ORAL_TABLET | Freq: Every day | ORAL | Status: DC

## 2012-07-06 NOTE — Telephone Encounter (Signed)
[facility].

## 2012-07-06 NOTE — Telephone Encounter (Signed)
[facility] requesting a refill on Fenofibrate 145mg , Escitalopram 20mg , Meloxicam 15mg , Nexium 40mg , and Crestor 40mg . She needs 90 day supply.

## 2012-07-12 DIAGNOSIS — H353290 Exudative age-related macular degeneration, unspecified eye, stage unspecified: Secondary | ICD-10-CM | POA: Diagnosis not present

## 2012-07-12 DIAGNOSIS — H269 Unspecified cataract: Secondary | ICD-10-CM | POA: Diagnosis not present

## 2012-08-05 DIAGNOSIS — K859 Acute pancreatitis without necrosis or infection, unspecified: Secondary | ICD-10-CM

## 2012-08-05 DIAGNOSIS — K831 Obstruction of bile duct: Secondary | ICD-10-CM

## 2012-08-05 HISTORY — PX: OTHER SURGICAL HISTORY: SHX169

## 2012-08-05 HISTORY — DX: Obstruction of bile duct: K83.1

## 2012-08-05 HISTORY — DX: Obstruction of bile duct: K85.90

## 2012-08-08 DIAGNOSIS — R918 Other nonspecific abnormal finding of lung field: Secondary | ICD-10-CM | POA: Diagnosis not present

## 2012-08-08 DIAGNOSIS — Z9889 Other specified postprocedural states: Secondary | ICD-10-CM | POA: Diagnosis not present

## 2012-08-08 DIAGNOSIS — I959 Hypotension, unspecified: Secondary | ICD-10-CM | POA: Diagnosis not present

## 2012-08-08 DIAGNOSIS — E8809 Other disorders of plasma-protein metabolism, not elsewhere classified: Secondary | ICD-10-CM | POA: Diagnosis present

## 2012-08-08 DIAGNOSIS — K859 Acute pancreatitis without necrosis or infection, unspecified: Secondary | ICD-10-CM | POA: Diagnosis not present

## 2012-08-08 DIAGNOSIS — R945 Abnormal results of liver function studies: Secondary | ICD-10-CM | POA: Diagnosis not present

## 2012-08-08 DIAGNOSIS — J9819 Other pulmonary collapse: Secondary | ICD-10-CM | POA: Diagnosis not present

## 2012-08-08 DIAGNOSIS — R7989 Other specified abnormal findings of blood chemistry: Secondary | ICD-10-CM | POA: Diagnosis not present

## 2012-08-08 DIAGNOSIS — R1011 Right upper quadrant pain: Secondary | ICD-10-CM | POA: Diagnosis not present

## 2012-08-08 DIAGNOSIS — I2510 Atherosclerotic heart disease of native coronary artery without angina pectoris: Secondary | ICD-10-CM | POA: Diagnosis not present

## 2012-08-08 DIAGNOSIS — R0602 Shortness of breath: Secondary | ICD-10-CM | POA: Diagnosis not present

## 2012-08-08 DIAGNOSIS — R112 Nausea with vomiting, unspecified: Secondary | ICD-10-CM | POA: Diagnosis not present

## 2012-08-08 DIAGNOSIS — K560 Paralytic ileus: Secondary | ICD-10-CM | POA: Diagnosis not present

## 2012-08-08 DIAGNOSIS — R82998 Other abnormal findings in urine: Secondary | ICD-10-CM | POA: Diagnosis present

## 2012-08-08 DIAGNOSIS — E871 Hypo-osmolality and hyponatremia: Secondary | ICD-10-CM | POA: Diagnosis not present

## 2012-08-08 DIAGNOSIS — R9389 Abnormal findings on diagnostic imaging of other specified body structures: Secondary | ICD-10-CM | POA: Diagnosis not present

## 2012-08-08 DIAGNOSIS — K838 Other specified diseases of biliary tract: Secondary | ICD-10-CM | POA: Diagnosis present

## 2012-08-08 DIAGNOSIS — F329 Major depressive disorder, single episode, unspecified: Secondary | ICD-10-CM | POA: Diagnosis present

## 2012-08-08 DIAGNOSIS — N179 Acute kidney failure, unspecified: Secondary | ICD-10-CM | POA: Diagnosis not present

## 2012-08-08 DIAGNOSIS — H3530 Unspecified macular degeneration: Secondary | ICD-10-CM | POA: Diagnosis present

## 2012-08-08 DIAGNOSIS — Z452 Encounter for adjustment and management of vascular access device: Secondary | ICD-10-CM | POA: Diagnosis not present

## 2012-08-08 DIAGNOSIS — Z9049 Acquired absence of other specified parts of digestive tract: Secondary | ICD-10-CM | POA: Insufficient documentation

## 2012-08-08 DIAGNOSIS — K219 Gastro-esophageal reflux disease without esophagitis: Secondary | ICD-10-CM | POA: Diagnosis not present

## 2012-08-08 DIAGNOSIS — Z4682 Encounter for fitting and adjustment of non-vascular catheter: Secondary | ICD-10-CM | POA: Diagnosis not present

## 2012-08-08 DIAGNOSIS — E876 Hypokalemia: Secondary | ICD-10-CM | POA: Diagnosis present

## 2012-08-08 DIAGNOSIS — D638 Anemia in other chronic diseases classified elsewhere: Secondary | ICD-10-CM | POA: Diagnosis present

## 2012-08-08 DIAGNOSIS — I10 Essential (primary) hypertension: Secondary | ICD-10-CM | POA: Diagnosis not present

## 2012-08-08 DIAGNOSIS — E860 Dehydration: Secondary | ICD-10-CM | POA: Diagnosis not present

## 2012-08-08 DIAGNOSIS — E785 Hyperlipidemia, unspecified: Secondary | ICD-10-CM | POA: Diagnosis present

## 2012-08-08 DIAGNOSIS — F3289 Other specified depressive episodes: Secondary | ICD-10-CM | POA: Diagnosis present

## 2012-08-08 DIAGNOSIS — I498 Other specified cardiac arrhythmias: Secondary | ICD-10-CM | POA: Diagnosis not present

## 2012-08-08 DIAGNOSIS — D72829 Elevated white blood cell count, unspecified: Secondary | ICD-10-CM | POA: Diagnosis present

## 2012-08-08 DIAGNOSIS — K9089 Other intestinal malabsorption: Secondary | ICD-10-CM | POA: Diagnosis not present

## 2012-08-08 DIAGNOSIS — R1013 Epigastric pain: Secondary | ICD-10-CM | POA: Diagnosis not present

## 2012-08-08 DIAGNOSIS — I509 Heart failure, unspecified: Secondary | ICD-10-CM | POA: Diagnosis present

## 2012-08-08 DIAGNOSIS — I5032 Chronic diastolic (congestive) heart failure: Secondary | ICD-10-CM | POA: Diagnosis not present

## 2012-08-09 DIAGNOSIS — Z8719 Personal history of other diseases of the digestive system: Secondary | ICD-10-CM | POA: Insufficient documentation

## 2012-08-09 DIAGNOSIS — E876 Hypokalemia: Secondary | ICD-10-CM | POA: Insufficient documentation

## 2012-08-09 DIAGNOSIS — K219 Gastro-esophageal reflux disease without esophagitis: Secondary | ICD-10-CM | POA: Insufficient documentation

## 2012-08-09 DIAGNOSIS — N289 Disorder of kidney and ureter, unspecified: Secondary | ICD-10-CM | POA: Insufficient documentation

## 2012-08-09 DIAGNOSIS — I10 Essential (primary) hypertension: Secondary | ICD-10-CM | POA: Insufficient documentation

## 2012-08-09 DIAGNOSIS — D72829 Elevated white blood cell count, unspecified: Secondary | ICD-10-CM | POA: Insufficient documentation

## 2012-08-09 DIAGNOSIS — H3530 Unspecified macular degeneration: Secondary | ICD-10-CM | POA: Insufficient documentation

## 2012-08-16 DIAGNOSIS — D649 Anemia, unspecified: Secondary | ICD-10-CM | POA: Insufficient documentation

## 2012-08-17 DIAGNOSIS — R0902 Hypoxemia: Secondary | ICD-10-CM | POA: Insufficient documentation

## 2012-08-18 DIAGNOSIS — E871 Hypo-osmolality and hyponatremia: Secondary | ICD-10-CM | POA: Insufficient documentation

## 2012-08-26 DIAGNOSIS — K859 Acute pancreatitis without necrosis or infection, unspecified: Secondary | ICD-10-CM | POA: Diagnosis not present

## 2012-08-26 DIAGNOSIS — K219 Gastro-esophageal reflux disease without esophagitis: Secondary | ICD-10-CM | POA: Diagnosis not present

## 2012-08-26 DIAGNOSIS — R197 Diarrhea, unspecified: Secondary | ICD-10-CM | POA: Diagnosis not present

## 2012-09-06 DIAGNOSIS — H353290 Exudative age-related macular degeneration, unspecified eye, stage unspecified: Secondary | ICD-10-CM | POA: Diagnosis not present

## 2012-09-20 ENCOUNTER — Other Ambulatory Visit: Payer: Self-pay | Admitting: Family Medicine

## 2012-09-20 ENCOUNTER — Ambulatory Visit (INDEPENDENT_AMBULATORY_CARE_PROVIDER_SITE_OTHER): Payer: Medicare Other | Admitting: Family Medicine

## 2012-09-20 ENCOUNTER — Encounter: Payer: Self-pay | Admitting: Family Medicine

## 2012-09-20 VITALS — BP 120/56 | HR 69 | Temp 98.5°F | Resp 16 | Ht 67.0 in | Wt 228.0 lb

## 2012-09-20 DIAGNOSIS — Z01419 Encounter for gynecological examination (general) (routine) without abnormal findings: Secondary | ICD-10-CM | POA: Diagnosis not present

## 2012-09-20 DIAGNOSIS — I10 Essential (primary) hypertension: Secondary | ICD-10-CM

## 2012-09-20 DIAGNOSIS — R7309 Other abnormal glucose: Secondary | ICD-10-CM | POA: Diagnosis not present

## 2012-09-20 DIAGNOSIS — R0600 Dyspnea, unspecified: Secondary | ICD-10-CM | POA: Insufficient documentation

## 2012-09-20 DIAGNOSIS — K859 Acute pancreatitis without necrosis or infection, unspecified: Secondary | ICD-10-CM | POA: Insufficient documentation

## 2012-09-20 DIAGNOSIS — R0989 Other specified symptoms and signs involving the circulatory and respiratory systems: Secondary | ICD-10-CM | POA: Diagnosis not present

## 2012-09-20 DIAGNOSIS — E7800 Pure hypercholesterolemia, unspecified: Secondary | ICD-10-CM | POA: Diagnosis not present

## 2012-09-20 DIAGNOSIS — R0609 Other forms of dyspnea: Secondary | ICD-10-CM | POA: Diagnosis not present

## 2012-09-20 DIAGNOSIS — Z0000 Encounter for general adult medical examination without abnormal findings: Secondary | ICD-10-CM | POA: Diagnosis not present

## 2012-09-20 LAB — POCT UA - MICROSCOPIC ONLY: Crystals, Ur, HPF, POC: NEGATIVE

## 2012-09-20 LAB — CBC WITH DIFFERENTIAL/PLATELET
Basophils Relative: 1 % (ref 0–1)
Eosinophils Absolute: 0.7 10*3/uL (ref 0.0–0.7)
MCH: 27.3 pg (ref 26.0–34.0)
MCHC: 32.3 g/dL (ref 30.0–36.0)
Neutrophils Relative %: 64 % (ref 43–77)
Platelets: 468 10*3/uL — ABNORMAL HIGH (ref 150–400)
RBC: 3.92 MIL/uL (ref 3.87–5.11)

## 2012-09-20 LAB — HEMOGLOBIN A1C: Hgb A1c MFr Bld: 6 % — ABNORMAL HIGH (ref <5.7)

## 2012-09-20 LAB — POCT URINALYSIS DIPSTICK
Bilirubin, UA: NEGATIVE
Nitrite, UA: POSITIVE
pH, UA: 5.5

## 2012-09-20 LAB — COMPREHENSIVE METABOLIC PANEL
ALT: 8 U/L (ref 0–35)
AST: 15 U/L (ref 0–37)
Creat: 1.13 mg/dL — ABNORMAL HIGH (ref 0.50–1.10)
Total Bilirubin: 0.4 mg/dL (ref 0.3–1.2)

## 2012-09-20 LAB — LIPID PANEL
HDL: 33 mg/dL — ABNORMAL LOW (ref >39)
LDL Cholesterol: 63 mg/dL (ref 0–99)
Triglycerides: 221 mg/dL — ABNORMAL HIGH (ref <150)
VLDL: 44 mg/dL — ABNORMAL HIGH (ref 0–40)

## 2012-09-20 LAB — CK: Total CK: 19 U/L (ref 7–177)

## 2012-09-20 MED ORDER — CHOLESTYRAMINE 4 G PO PACK: 1.0000 | PACK | Freq: Every day | ORAL | Status: DC

## 2012-09-20 MED ORDER — METOPROLOL TARTRATE 25 MG PO TABS: 25.0000 mg | ORAL_TABLET | Freq: Two times a day (BID) | ORAL | Status: DC

## 2012-09-20 NOTE — Progress Notes (Signed)
[address]   [place], [place]  [address]   [phone]  Subjective:    Patient ID: Gail Burns, female    DOB: 22-Aug-1945, 67 y.o.   MRN: [id number]  HPI This 67 y.o. female presents for Annual Wellness Exam.  Last physical 01/2011. Pap smear . Mammogram 2012.  [place].   Colonoscopy 2010.   Pneumovax 2011. TDAP 2009. Flu vaccine 03/16/12. Zostavax 2012.  BFP. Eye exam macular degeneration; +cataract.  Cousins/DUMF. Dental exam Dr. Hipolito Bayley.  6 months ago; due again. Bone density scan 2006.    Pancreatitis:  Admitted for two weeks; family thought patient was going to die. Stepdown unit.  Admitted 04/2012 for pancreatitis; s/p cholecystectomy by Dr. Colette Ribas.  Must have a procedure on ducts from pancreas; needs surgical procedure.  Could not have procedure performed during admission because of pancreatitis.  Needs GI.  To call GI in [place] for name of doctor at [facility].  Has lost 12 pounds since last visit. Cannot eat much due to early satiety.  Last night, could hear a little wheezing.   Was maintained on Lovenox injections during admission.     SOB:     Review of Systems  Constitutional: Positive for appetite change and fatigue. Negative for fever, chills, diaphoresis, activity change and unexpected weight change.  HENT: Negative for hearing loss, ear pain, nosebleeds, congestion, sore throat, facial swelling, rhinorrhea, sneezing, drooling, mouth sores, trouble swallowing, neck pain, neck stiffness, dental problem, voice change, postnasal drip, sinus pressure, tinnitus and ear discharge.   Eyes: Positive for visual disturbance. Negative for photophobia, pain, discharge, redness and itching.  Respiratory: Negative for apnea, cough, choking, chest tightness, shortness of breath, wheezing and stridor.   Cardiovascular: Negative for chest pain, palpitations and leg swelling.  Gastrointestinal: Positive for nausea and abdominal pain. Negative for vomiting, diarrhea, constipation, blood  in stool, abdominal distention, anal bleeding and rectal pain.  Endocrine: Negative for cold intolerance, heat intolerance, polydipsia, polyphagia and polyuria.  Genitourinary: Negative for dysuria, urgency, frequency, hematuria, flank pain, decreased urine volume, vaginal bleeding, vaginal discharge, enuresis, difficulty urinating, genital sores, pelvic pain and dyspareunia.  Musculoskeletal: Positive for back pain. Negative for myalgias, joint swelling, arthralgias and gait problem.  Skin: Negative for color change, pallor and rash.  Allergic/Immunologic: Negative for environmental allergies, food allergies and immunocompromised state.  Neurological: Negative for dizziness, tremors, seizures, syncope, facial asymmetry, speech difficulty, weakness, light-headedness, numbness and headaches.  Hematological: Negative for adenopathy. Does not bruise/bleed easily.  Psychiatric/Behavioral: Positive for sleep disturbance. Negative for suicidal ideas, hallucinations, behavioral problems, confusion, self-injury, dysphoric mood, decreased concentration and agitation. The patient is not nervous/anxious and is not hyperactive.     Past Medical History  Diagnosis Date  . Anxiety   . Depression   . Hyperlipidemia   . Hypertension   . Degeneration of lumbar or lumbosacral intervertebral disc   . Other dyspnea and respiratory abnormality   . Other malaise and fatigue   . Benign neoplasm of colon   . Basal cell carcinoma   . Seborrheic dermatitis, unspecified   . Coronary atherosclerosis of native coronary artery   . Obesity, unspecified   . Tobacco use disorder   . Esophageal reflux   . Impaired glucose tolerance test   . Internal hemorrhoids without mention of complication   . Diverticulosis of colon (without mention of hemorrhage)   . Unspecified disorder of kidney and ureter   . Macular degeneration   . Pancreatitis due to biliary obstruction 08/05/2012  two week admission in [place].  Step  down unit.    Past Surgical History  Procedure Laterality Date  . Cardiac catheterization  2006    80% blockage  . Dilatation and curettage  x 2  . Tubal ligation  1982  . Knee surgery      Right  . Eye lid surgery  2012  . Laser therapy and injections for macular degeneration    . Cholecystectomy    . Admission  08/05/2012    Acute pancreatitis. [place]. Two week admission; step down unit.    Prior to Admission medications   Medication Sig Start Date End Date Taking? Authorizing Provider  aspirin 81 MG tablet Take 81 mg by mouth daily.   Yes Historical Provider, MD  calcium carbonate (OS-CAL) 600 MG TABS Take 600 mg by mouth 2 (two) times daily with a meal.   Yes Historical Provider, MD  chlorthalidone (HYGROTON) 25 MG tablet Take 25 mg by mouth daily.   Yes Historical Provider, MD  escitalopram (LEXAPRO) 20 MG tablet Take 1 tablet (20 mg total) by mouth daily. 07/06/12  Yes Godfrey Pick, PA-C  esomeprazole (NEXIUM) 40 MG capsule Take 1 capsule (40 mg total) by mouth daily before breakfast. 07/06/12  Yes Eleanore E Egan, PA-C  fenofibrate (TRICOR) 145 MG tablet Take 1 tablet (145 mg total) by mouth daily. 07/06/12  Yes Eleanore E Debbra Riding, PA-C  ibuprofen (ADVIL,MOTRIN) 800 MG tablet Take 800 mg by mouth every 8 (eight) hours as needed.   Yes Historical Provider, MD  KRILL OIL 1000 MG CAPS Take by mouth.   Yes Historical Provider, MD  lisinopril (PRINIVIL,ZESTRIL) 20 MG tablet Take 1 tablet (20 mg total) by mouth daily. 04/27/12  Yes Heather M Marte, PA-C  meloxicam (MOBIC) 15 MG tablet Take 1 tablet (15 mg total) by mouth daily. 07/06/12  Yes Godfrey Pick, PA-C  Misc Natural Products (LUTEIN VISION BLEND PO) Take by mouth daily.   Yes Historical Provider, MD  Multiple Vitamin (MULTIVITAMIN) tablet Take 1 tablet by mouth daily.   Yes Historical Provider, MD  Multiple Vitamins-Minerals (ULTRA ANTIOXIDANT FORMULA PO) Take by mouth.   Yes Historical Provider, MD  PRESCRIPTION MEDICATION  Metoprolol tartrate 25 mg 1 po bid   Yes Historical Provider, MD  PRESCRIPTION MEDICATION Cholestyramine take one Packet by Oral route every day at bedtime   Yes Historical Provider, MD  rosuvastatin (CRESTOR) 40 MG tablet Take 1 tablet (40 mg total) by mouth daily. 07/06/12  Yes Eleanore Delia Chimes, PA-C  cholestyramine Lanetta Inch) 4 G packet Take 1 packet by mouth at bedtime. 09/20/12   Ethelda Chick, MD  metoprolol tartrate (LOPRESSOR) 25 MG tablet Take 1 tablet (25 mg total) by mouth 2 (two) times daily. 09/20/12   Ethelda Chick, MD    Allergies  Allergen Reactions  . Augmentin (Amoxicillin-Pot Clavulanate) Hives    History   Social History  . Marital Status: Married    Spouse Name: N/A    Number of Children: 2  . Years of Education: 12   Occupational History  . substitute school teacher    Social History Main Topics  . Smoking status: Former Smoker -- 0.50 packs/day for 40 years    Types: Cigarettes    Quit date: 02/18/1990  . Smokeless tobacco: Not on file     Comment: quit 1991  . Alcohol Use: No     Comment: occasional twice a month;  per patient health survery form - No  .  Drug Use: No  . Sexually Active: Not Currently     Comment: due to atrophic vaginitis   Other Topics Concern  . Not on file   Social History Narrative   Marital status: married x 47 yrs, moderately happily married, no abuse, moved from [place] in 2007.  Husband with infidelity in past and in 2013.      Children: live in [place]      Lives: with husband.      Employment: unemployed.      Tobacco: none       Alcohol:  None       Drugs: none      Exercise: Minimal      Caffeine use: coffee 1 serving/day.      Always uses seat belts, smoke alarm in the home, no guns in the home.    Family History  Problem Relation Age of Onset  . Cancer Mother   . Heart disease Mother       CAD     has pacemaker  . Dementia Mother   . Diabetes Mother   . Macular degeneration Mother   . Heart disease Father     . Rheumatic fever Father   . Heart disease Sister   . Hyperlipidemia Sister   . Stroke Sister     TIA's  . Heart disease Brother     CABG age 92  . Heart disease Maternal Grandfather   . Hyperlipidemia Sister   . Hypertension Sister   . Parkinson's disease Maternal Grandmother   . Heart disease Paternal Grandmother        Objective:   Physical Exam  Nursing note and vitals reviewed. Constitutional: She is oriented to person, place, and time. She appears well-developed and well-nourished. No distress.  HENT:  Head: Normocephalic and atraumatic.  Right Ear: External ear normal.  Left Ear: External ear normal.  Nose: Nose normal.  Mouth/Throat: Oropharynx is clear and moist.  Eyes: Conjunctivae and EOM are normal. Pupils are equal, round, and reactive to light.  Neck: Normal range of motion. Neck supple. No JVD present. No thyromegaly present.  Cardiovascular: Normal rate, regular rhythm, normal heart sounds and intact distal pulses.  Exam reveals no gallop and no friction rub.   No murmur heard. Pulmonary/Chest: Effort normal and breath sounds normal. She has no wheezes. She has no rales.  Abdominal: Soft. Bowel sounds are normal. She exhibits no distension and no mass. There is no tenderness. There is no rebound and no guarding. Hernia confirmed negative in the right inguinal area and confirmed negative in the left inguinal area.  Genitourinary: Vagina normal and uterus normal. No breast swelling, tenderness, discharge or bleeding. There is no rash, tenderness or lesion on the right labia. There is no rash, tenderness or lesion on the left labia. Cervix exhibits no motion tenderness, no discharge and no friability. Right adnexum displays no mass, no tenderness and no fullness. Left adnexum displays no mass, no tenderness and no fullness. No erythema or bleeding around the vagina. No foreign body around the vagina. No signs of injury around the vagina. No vaginal discharge found.   Musculoskeletal: Normal range of motion.  Lymphadenopathy:    She has no cervical adenopathy.       Right: No inguinal adenopathy present.       Left: No inguinal adenopathy present.  Neurological: She is alert and oriented to person, place, and time. She has normal reflexes. No cranial nerve deficit. She exhibits normal muscle tone.  Coordination normal.  Skin: Skin is warm and dry. No rash noted. She is not diaphoretic. No erythema. No pallor.  Psychiatric: She has a normal mood and affect. Her behavior is normal. Judgment and thought content normal.   Results for orders placed in visit on 09/20/12  POCT URINALYSIS DIPSTICK      Result Value Range   Color, UA yellow     Clarity, UA clear     Glucose, UA neg     Bilirubin, UA neg     Ketones, UA neg     Spec Grav, UA 1.020     Blood, UA trace     pH, UA 5.5     Protein, UA trace     Urobilinogen, UA 0.2     Nitrite, UA positive     Leukocytes, UA large (3+)    POCT UA - MICROSCOPIC ONLY      Result Value Range   WBC, Ur, HPF, POC tntc     RBC, urine, microscopic 0-4     Bacteria, U Microscopic 2+     Mucus, UA trace     Epithelial cells, urine per micros 0-6     Crystals, Ur, HPF, POC neg     Casts, Ur, LPF, POC neg     Yeast, UA neg     EKG: NSR    Assessment & Plan:

## 2012-09-20 NOTE — Assessment & Plan Note (Signed)
Stable; weight down 12 pounds in six months; continue low fat diet.  Obtain labs.

## 2012-09-20 NOTE — Assessment & Plan Note (Signed)
Uncontrolled; weight down 12 pounds in six months; obtain labs.

## 2012-09-20 NOTE — Assessment & Plan Note (Addendum)
New.  Recent prolonged hospitalization yet received Lovenox.  Normal exam in office; no distress.  Likely secondary to deconditioning and abdominal pathology. If persists, to call office for VQ scan.

## 2012-09-20 NOTE — Addendum Note (Signed)
Addended by: Ethelda Chick on: 09/20/2012 02:41 PM   Modules accepted: Orders

## 2012-09-20 NOTE — Assessment & Plan Note (Signed)
Controlled; obtain labs, u/a, EKG.

## 2012-09-20 NOTE — Addendum Note (Signed)
Addended by: Mervin Kung on: 09/20/2012 02:47 PM   Modules accepted: Orders

## 2012-09-20 NOTE — Progress Notes (Signed)
  Subjective:    Patient ID: Gail Burns, female    DOB: [birth year], 67 y.o.   MRN: [id number]  HPI    Review of Systems  Constitutional: Positive for activity change.  HENT: Negative.   Eyes: Positive for visual disturbance.       Macular degeneration  Respiratory: Negative.   Cardiovascular: Negative.   Gastrointestinal: Negative.   Endocrine: Negative.   Genitourinary: Negative.   Musculoskeletal: Positive for back pain.  Skin: Negative.   Allergic/Immunologic: Negative.   Neurological: Negative.   Hematological: Negative.   Psychiatric/Behavioral: Negative.        Objective:   Physical Exam        Assessment & Plan:

## 2012-09-20 NOTE — Assessment & Plan Note (Signed)
Anticipatory guidance --- weight loss, exercise.  Pap smear obtained; refer for mammogram. Colonoscopy UTD.  Immunizations UTD.  Low fall risk.  Independent with ADLs. Undergoing treatment for anxiety/depression.  Normal hearing.  FULL CODE.

## 2012-09-20 NOTE — Assessment & Plan Note (Signed)
Pap smear obtained; refer for mammogram and bone density scan.

## 2012-09-20 NOTE — Assessment & Plan Note (Signed)
New.  Obtain records.  To follow-up with GI at [facility] for ductal procedure.  Clinically improved.

## 2012-09-21 LAB — IRON AND TIBC
Iron: 38 ug/dL — ABNORMAL LOW (ref 42–145)
TIBC: 491 ug/dL — ABNORMAL HIGH (ref 250–470)
UIBC: 453 ug/dL — ABNORMAL HIGH (ref 125–400)

## 2012-09-21 LAB — FERRITIN: Ferritin: 120 ng/mL (ref 10–291)

## 2012-09-21 LAB — PAP IG (IMAGE GUIDED)

## 2012-09-29 DIAGNOSIS — K859 Acute pancreatitis without necrosis or infection, unspecified: Secondary | ICD-10-CM | POA: Diagnosis not present

## 2012-10-13 DIAGNOSIS — L819 Disorder of pigmentation, unspecified: Secondary | ICD-10-CM | POA: Diagnosis not present

## 2012-10-13 DIAGNOSIS — L570 Actinic keratosis: Secondary | ICD-10-CM | POA: Diagnosis not present

## 2012-10-27 DIAGNOSIS — K861 Other chronic pancreatitis: Secondary | ICD-10-CM | POA: Diagnosis not present

## 2012-10-27 DIAGNOSIS — K862 Cyst of pancreas: Secondary | ICD-10-CM | POA: Diagnosis not present

## 2012-10-27 DIAGNOSIS — K8689 Other specified diseases of pancreas: Secondary | ICD-10-CM | POA: Diagnosis not present

## 2012-10-27 DIAGNOSIS — K859 Acute pancreatitis without necrosis or infection, unspecified: Secondary | ICD-10-CM | POA: Diagnosis not present

## 2012-10-27 DIAGNOSIS — K863 Pseudocyst of pancreas: Secondary | ICD-10-CM | POA: Diagnosis not present

## 2012-11-01 DIAGNOSIS — H353290 Exudative age-related macular degeneration, unspecified eye, stage unspecified: Secondary | ICD-10-CM | POA: Diagnosis not present

## 2012-11-01 DIAGNOSIS — H353190 Nonexudative age-related macular degeneration, unspecified eye, stage unspecified: Secondary | ICD-10-CM | POA: Diagnosis not present

## 2012-12-15 DIAGNOSIS — I10 Essential (primary) hypertension: Secondary | ICD-10-CM | POA: Diagnosis not present

## 2012-12-15 DIAGNOSIS — K2100 Gastro-esophageal reflux disease with esophagitis, without bleeding: Secondary | ICD-10-CM | POA: Diagnosis not present

## 2012-12-15 DIAGNOSIS — E785 Hyperlipidemia, unspecified: Secondary | ICD-10-CM | POA: Diagnosis not present

## 2012-12-15 DIAGNOSIS — I2510 Atherosclerotic heart disease of native coronary artery without angina pectoris: Secondary | ICD-10-CM | POA: Diagnosis not present

## 2012-12-21 ENCOUNTER — Encounter: Payer: Self-pay | Admitting: Family Medicine

## 2012-12-21 ENCOUNTER — Ambulatory Visit (INDEPENDENT_AMBULATORY_CARE_PROVIDER_SITE_OTHER): Payer: Medicare Other | Admitting: Family Medicine

## 2012-12-21 VITALS — BP 133/70 | HR 60 | Temp 98.4°F | Resp 16 | Ht 67.0 in | Wt 222.0 lb

## 2012-12-21 DIAGNOSIS — I10 Essential (primary) hypertension: Secondary | ICD-10-CM

## 2012-12-21 DIAGNOSIS — E7800 Pure hypercholesterolemia, unspecified: Secondary | ICD-10-CM

## 2012-12-21 DIAGNOSIS — Z23 Encounter for immunization: Secondary | ICD-10-CM | POA: Diagnosis not present

## 2012-12-21 DIAGNOSIS — R7309 Other abnormal glucose: Secondary | ICD-10-CM | POA: Diagnosis not present

## 2012-12-21 DIAGNOSIS — D649 Anemia, unspecified: Secondary | ICD-10-CM

## 2012-12-21 DIAGNOSIS — K859 Acute pancreatitis without necrosis or infection, unspecified: Secondary | ICD-10-CM

## 2012-12-21 LAB — COMPREHENSIVE METABOLIC PANEL
BUN: 23 mg/dL (ref 6–23)
CO2: 28 mEq/L (ref 19–32)
Calcium: 9.9 mg/dL (ref 8.4–10.5)
Chloride: 104 mEq/L (ref 96–112)
Creat: 1.28 mg/dL — ABNORMAL HIGH (ref 0.50–1.10)
Glucose, Bld: 91 mg/dL (ref 70–99)

## 2012-12-21 LAB — CBC
HCT: 36.9 % (ref 36.0–46.0)
MCV: 84.2 fL (ref 78.0–100.0)
RBC: 4.38 MIL/uL (ref 3.87–5.11)
WBC: 6.5 10*3/uL (ref 4.0–10.5)

## 2012-12-21 LAB — IRON AND TIBC
%SAT: 14 % — ABNORMAL LOW (ref 20–55)
Iron: 70 ug/dL (ref 42–145)
UIBC: 416 ug/dL — ABNORMAL HIGH (ref 125–400)

## 2012-12-21 LAB — LIPID PANEL
Cholesterol: 173 mg/dL (ref 0–200)
VLDL: 21 mg/dL (ref 0–40)

## 2012-12-21 LAB — HEMOGLOBIN A1C: Mean Plasma Glucose: 128 mg/dL — ABNORMAL HIGH (ref <117)

## 2012-12-21 LAB — CK: Total CK: 38 U/L (ref 7–177)

## 2012-12-21 NOTE — Patient Instructions (Addendum)
1. Please call [facility] at [phone] for mammogram and bone density scan.

## 2012-12-21 NOTE — Progress Notes (Signed)
[address]   [place], [place]  [address]   [phone]  Subjective:    Patient ID: Gail Burns, female    DOB: Feb 01, 1946, 67 y.o.   MRN: [id number]  HPI This 67 y.o. female presents for three month follow-up of the following:  1. Pancreatitis:  S/p admission 04/2012.   "I am good." Went to [facility], had MRI. 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 Was told cysts push on stomach and make her feel full. Follow up next month. "Feel wonderful." Visited sister in [place] for 1 month long vacation. Didn't gain any weight. Not eating like she used to. No problems with diarrhea, but poops a lot. This has occurred since gallbladder removed. Taking Questran 1/2 packet every day. Wondering if she can decrease this to every other day.  2. Anemia: present at visit three months ago; denies n/v/d/c; denies melena or bloody stools; intermittent abdominal pain.   3. CAD: s/p cardiology follow-up last week at [facility] office; no change in medications.  4. Anxiety:  Stable on medication; relationship with husband is stable; no concerns today.  Review of Systems Saw Dr. Jarvis Newcomer NP last week. No changes in meds. No CP/SOB. No coughing. No swelling. Daughter lives near by. Encourages her to exercise and eat well. Patient not exercising as regularly. Not checking BP at home.   Did not have mammogram or bone density test. Doing "OK" emotionally; "I think I'm doing good. My husband and I argue quite a bit. This isn't new." Just celebrated 49th wedding anniversary last month.   Past Medical History  Diagnosis Date  . Anxiety   . Depression   . Hyperlipidemia   . Hypertension   . Degeneration of lumbar or lumbosacral intervertebral disc   . Other dyspnea and respiratory abnormality   . Other malaise and fatigue   . Benign neoplasm of colon   . Basal cell carcinoma   .  Seborrheic dermatitis, unspecified   . Coronary atherosclerosis of native coronary artery   . Obesity, unspecified   . Tobacco use disorder   . Esophageal reflux   . Impaired glucose tolerance test   . Internal hemorrhoids without mention of complication   . Diverticulosis of colon (without mention of hemorrhage)   . Unspecified disorder of kidney and ureter   . Macular degeneration   . Pancreatitis due to biliary obstruction 08/05/2012    two week admission in [place].  Step down unit.   Past Surgical History  Procedure Laterality Date  . Cardiac catheterization  2006    80% blockage  . Dilatation and curettage  x 2  . Tubal ligation  1982  . Knee surgery      Right  . Eye lid surgery  2012  . Laser therapy and injections for macular degeneration    . Cholecystectomy    . Admission  08/05/2012    Acute pancreatitis. [place]. Two week admission; step down unit.   Allergies  Allergen Reactions  . Augmentin [Amoxicillin-Pot Clavulanate] Hives   Current Outpatient Prescriptions on File Prior to Visit  Medication Sig Dispense Refill  . aspirin 81 MG tablet Take 81 mg by mouth daily.      . calcium carbonate (OS-CAL) 600 MG TABS Take 600 mg by mouth 2 (two) times daily with a meal.      . cholestyramine (QUESTRAN) 4  G packet Take 1 packet by mouth at bedtime.  90 each  3  . metoprolol tartrate (LOPRESSOR) 25 MG tablet Take 1 tablet (25 mg total) by mouth 2 (two) times daily.  180 tablet  3  . Misc Natural Products (LUTEIN VISION BLEND PO) Take by mouth daily.      . Multiple Vitamin (MULTIVITAMIN) tablet Take 1 tablet by mouth daily.      . Multiple Vitamins-Minerals (ULTRA ANTIOXIDANT FORMULA PO) Take by mouth.      . chlorthalidone (HYGROTON) 25 MG tablet Take 25 mg by mouth daily.       No current facility-administered medications on file prior to visit.   History   Social History  . Marital Status: Married    Spouse Name: N/A    Number of Children: 2  . Years of  Education: 12   Occupational History  . substitute school teacher    Social History Main Topics  . Smoking status: Former Smoker -- 0.50 packs/day for 40 years    Types: Cigarettes    Quit date: 02/18/1990  . Smokeless tobacco: Not on file     Comment: quit 1991  . Alcohol Use: No     Comment: occasional twice a month;  per patient health survery form - No  . Drug Use: No  . Sexual Activity: Not Currently     Comment: due to atrophic vaginitis   Other Topics Concern  . Not on file   Social History Narrative   Marital status: married x 47 yrs, moderately happily married, no abuse, moved from [place] in 2007.  Husband with infidelity in past and in 2013.      Children: live in [place]      Lives: with husband.      Employment: unemployed.      Tobacco: none       Alcohol:  None       Drugs: none      Exercise: Minimal      Caffeine use: coffee 1 serving/day.      Always uses seat belts, smoke alarm in the home, no guns in the home.    Objective:   Physical Exam  Nursing note and vitals reviewed. Constitutional: She is oriented to person, place, and time. She appears well-developed and well-nourished. No distress.  HENT:  Head: Normocephalic and atraumatic.  Eyes: Conjunctivae and EOM are normal. Pupils are equal, round, and reactive to light.  Neck: Normal range of motion. Neck supple. No thyromegaly present.  Cardiovascular: Normal rate, regular rhythm and normal heart sounds.  Exam reveals no gallop and no friction rub.   No murmur heard. Pulmonary/Chest: Effort normal and breath sounds normal. She has no wheezes. She has no rales.  Abdominal: Soft. Bowel sounds are normal. She exhibits no distension and no mass. There is no tenderness. There is no rebound and no guarding.  Lymphadenopathy:    She has no cervical adenopathy.  Neurological: She is alert and oriented to person, place, and time.  Skin: Skin is warm and dry. No rash noted. She is not diaphoretic.   Psychiatric: She has a normal mood and affect. Her behavior is normal.   INFLUENZA VACCINE ADMINISTERED.    Assessment & Plan:  Essential hypertension, benign - Plan: CBC, Comprehensive metabolic panel, CK  Pancreatitis, acute  Pure hypercholesterolemia - Plan: Lipid panel  Other abnormal glucose - Plan: Hemoglobin A1c  Anemia, unspecified - Plan: CBC, Iron, Ferritin, Iron and TIBC  Need for prophylactic  vaccination and inoculation against influenza - Plan: Flu Vaccine QUAD 36+ mos IM  1. HTN: controlled; obtain labs; continue current medications. 2.  Hypercholesterolemia: moderately controlled; obtain labs; continue medications. 3.  Glucose intolerance: controlled; obtain labs; continue dietary modification. 4.  Anemia:  New onset. Repeat today; likely related to recent acute illness with hospitalization. 5.  Pancreatitis: stable; s/p follow-up with GI at [facility]; no intervention warranted at this time; will follow-up in two months. 6. S/p influenza vaccine in office. No orders of the defined types were placed in this encounter.

## 2012-12-23 ENCOUNTER — Telehealth: Payer: Self-pay

## 2012-12-23 DIAGNOSIS — Z780 Asymptomatic menopausal state: Secondary | ICD-10-CM

## 2012-12-23 DIAGNOSIS — Z13820 Encounter for screening for osteoporosis: Secondary | ICD-10-CM

## 2012-12-23 DIAGNOSIS — E2839 Other primary ovarian failure: Secondary | ICD-10-CM

## 2012-12-23 NOTE — Telephone Encounter (Signed)
Pended order, please advise.  

## 2012-12-23 NOTE — Telephone Encounter (Signed)
Pt states an order for her bone density needs to be sent to [facility] center   Best number [phone]

## 2012-12-29 ENCOUNTER — Ambulatory Visit: Payer: Self-pay | Admitting: Family Medicine

## 2012-12-29 DIAGNOSIS — Z1231 Encounter for screening mammogram for malignant neoplasm of breast: Secondary | ICD-10-CM | POA: Diagnosis not present

## 2012-12-29 LAB — HM MAMMOGRAPHY: HM Mammogram: NEGATIVE

## 2013-01-10 DIAGNOSIS — H353290 Exudative age-related macular degeneration, unspecified eye, stage unspecified: Secondary | ICD-10-CM | POA: Diagnosis not present

## 2013-01-17 ENCOUNTER — Other Ambulatory Visit: Payer: Self-pay | Admitting: Physician Assistant

## 2013-01-26 DIAGNOSIS — K8591 Acute pancreatitis with uninfected necrosis, unspecified: Secondary | ICD-10-CM | POA: Insufficient documentation

## 2013-01-26 DIAGNOSIS — K862 Cyst of pancreas: Secondary | ICD-10-CM | POA: Diagnosis not present

## 2013-01-26 DIAGNOSIS — K8689 Other specified diseases of pancreas: Secondary | ICD-10-CM | POA: Insufficient documentation

## 2013-01-26 DIAGNOSIS — K859 Acute pancreatitis without necrosis or infection, unspecified: Secondary | ICD-10-CM | POA: Diagnosis not present

## 2013-02-18 DIAGNOSIS — H353290 Exudative age-related macular degeneration, unspecified eye, stage unspecified: Secondary | ICD-10-CM | POA: Diagnosis not present

## 2013-03-01 ENCOUNTER — Encounter: Payer: Self-pay | Admitting: Family Medicine

## 2013-03-11 DIAGNOSIS — R21 Rash and other nonspecific skin eruption: Secondary | ICD-10-CM | POA: Diagnosis not present

## 2013-03-11 DIAGNOSIS — L219 Seborrheic dermatitis, unspecified: Secondary | ICD-10-CM | POA: Diagnosis not present

## 2013-03-11 DIAGNOSIS — D485 Neoplasm of uncertain behavior of skin: Secondary | ICD-10-CM | POA: Diagnosis not present

## 2013-03-11 DIAGNOSIS — L570 Actinic keratosis: Secondary | ICD-10-CM | POA: Diagnosis not present

## 2013-03-21 DIAGNOSIS — H353290 Exudative age-related macular degeneration, unspecified eye, stage unspecified: Secondary | ICD-10-CM | POA: Diagnosis not present

## 2013-04-05 DIAGNOSIS — H2510 Age-related nuclear cataract, unspecified eye: Secondary | ICD-10-CM | POA: Diagnosis not present

## 2013-04-05 DIAGNOSIS — H269 Unspecified cataract: Secondary | ICD-10-CM | POA: Diagnosis not present

## 2013-04-17 ENCOUNTER — Other Ambulatory Visit: Payer: Self-pay | Admitting: Family Medicine

## 2013-04-25 ENCOUNTER — Ambulatory Visit (INDEPENDENT_AMBULATORY_CARE_PROVIDER_SITE_OTHER): Payer: Medicare Other | Admitting: Family Medicine

## 2013-04-25 ENCOUNTER — Encounter: Payer: Self-pay | Admitting: Family Medicine

## 2013-04-25 VITALS — BP 148/70 | HR 63 | Temp 98.4°F | Resp 16 | Ht 67.0 in | Wt 229.6 lb

## 2013-04-25 DIAGNOSIS — I10 Essential (primary) hypertension: Secondary | ICD-10-CM

## 2013-04-25 DIAGNOSIS — I2510 Atherosclerotic heart disease of native coronary artery without angina pectoris: Secondary | ICD-10-CM | POA: Diagnosis not present

## 2013-04-25 DIAGNOSIS — F329 Major depressive disorder, single episode, unspecified: Secondary | ICD-10-CM | POA: Diagnosis not present

## 2013-04-25 DIAGNOSIS — E782 Mixed hyperlipidemia: Secondary | ICD-10-CM | POA: Diagnosis not present

## 2013-04-25 DIAGNOSIS — F32A Depression, unspecified: Secondary | ICD-10-CM

## 2013-04-25 DIAGNOSIS — E7800 Pure hypercholesterolemia, unspecified: Secondary | ICD-10-CM

## 2013-04-25 DIAGNOSIS — F3289 Other specified depressive episodes: Secondary | ICD-10-CM | POA: Diagnosis not present

## 2013-04-25 DIAGNOSIS — K859 Acute pancreatitis without necrosis or infection, unspecified: Secondary | ICD-10-CM

## 2013-04-25 DIAGNOSIS — R7309 Other abnormal glucose: Secondary | ICD-10-CM | POA: Diagnosis not present

## 2013-04-25 LAB — COMPLETE METABOLIC PANEL WITH GFR
ALK PHOS: 46 U/L (ref 39–117)
ALT: 11 U/L (ref 0–35)
AST: 18 U/L (ref 0–37)
Albumin: 4.2 g/dL (ref 3.5–5.2)
BILIRUBIN TOTAL: 0.7 mg/dL (ref 0.3–1.2)
BUN: 26 mg/dL — ABNORMAL HIGH (ref 6–23)
CO2: 26 meq/L (ref 19–32)
Calcium: 9.7 mg/dL (ref 8.4–10.5)
Chloride: 104 mEq/L (ref 96–112)
Creat: 1.18 mg/dL — ABNORMAL HIGH (ref 0.50–1.10)
GFR, EST AFRICAN AMERICAN: 54 mL/min — AB
GFR, EST NON AFRICAN AMERICAN: 47 mL/min — AB
GLUCOSE: 92 mg/dL (ref 70–99)
Potassium: 4.6 mEq/L (ref 3.5–5.3)
Sodium: 140 mEq/L (ref 135–145)
Total Protein: 6.8 g/dL (ref 6.0–8.3)

## 2013-04-25 LAB — LIPID PANEL
CHOLESTEROL: 200 mg/dL (ref 0–200)
HDL: 64 mg/dL (ref >39)
LDL Cholesterol: 113 mg/dL — ABNORMAL HIGH (ref 0–99)
TRIGLYCERIDES: 113 mg/dL (ref <150)
Total CHOL/HDL Ratio: 3.1 Ratio
VLDL: 23 mg/dL (ref 0–40)

## 2013-04-25 LAB — CBC WITH DIFFERENTIAL/PLATELET
BASOS PCT: 1 % (ref 0–1)
Basophils Absolute: 0 10*3/uL (ref 0.0–0.1)
EOS ABS: 0.3 10*3/uL (ref 0.0–0.7)
Eosinophils Relative: 5 % (ref 0–5)
HEMATOCRIT: 38.6 % (ref 36.0–46.0)
HEMOGLOBIN: 13 g/dL (ref 12.0–15.0)
Lymphocytes Relative: 26 % (ref 12–46)
Lymphs Abs: 1.5 10*3/uL (ref 0.7–4.0)
MCH: 29.7 pg (ref 26.0–34.0)
MCHC: 33.7 g/dL (ref 30.0–36.0)
MCV: 88.1 fL (ref 78.0–100.0)
MONO ABS: 0.5 10*3/uL (ref 0.1–1.0)
MONOS PCT: 9 % (ref 3–12)
Neutro Abs: 3.6 10*3/uL (ref 1.7–7.7)
Neutrophils Relative %: 59 % (ref 43–77)
Platelets: 290 10*3/uL (ref 150–400)
RBC: 4.38 MIL/uL (ref 3.87–5.11)
RDW: 14 % (ref 11.5–15.5)
WBC: 6 10*3/uL (ref 4.0–10.5)

## 2013-04-25 LAB — HEMOGLOBIN A1C
HEMOGLOBIN A1C: 6 % — AB (ref <5.7)
Mean Plasma Glucose: 126 mg/dL — ABNORMAL HIGH (ref <117)

## 2013-04-25 MED ORDER — ROSUVASTATIN CALCIUM 40 MG PO TABS: 40.0000 mg | ORAL_TABLET | Freq: Every day | ORAL | Status: DC

## 2013-04-25 MED ORDER — ESOMEPRAZOLE MAGNESIUM 40 MG PO CPDR: 40.0000 mg | DELAYED_RELEASE_CAPSULE | Freq: Every day | ORAL | Status: DC

## 2013-04-25 MED ORDER — ESCITALOPRAM OXALATE 20 MG PO TABS: 20.0000 mg | ORAL_TABLET | Freq: Every day | ORAL | Status: DC

## 2013-04-25 MED ORDER — MELOXICAM 15 MG PO TABS: 15.0000 mg | ORAL_TABLET | Freq: Every day | ORAL | Status: DC

## 2013-04-25 MED ORDER — FENOFIBRATE 145 MG PO TABS: 145.0000 mg | ORAL_TABLET | Freq: Every day | ORAL | Status: DC

## 2013-04-25 MED ORDER — METOPROLOL TARTRATE 25 MG PO TABS: 25.0000 mg | ORAL_TABLET | Freq: Two times a day (BID) | ORAL | Status: DC

## 2013-04-25 NOTE — Progress Notes (Signed)
Subjective:    Patient ID: Gail Burns, female    DOB: Feb 18, 1946, 68 y.o.   MRN: [id number]  HPI This 68 y.o. female presents for four month follow-up:  1.  Depression and anxiety:  Stable; no changes in therapy made at last visit four months ago; compliance with Lexapro; good tolerance to medication; good symptom control.  Overall very happy; happy to have children and grandchildren close by. Husband is tolerable; pt and husband argue all the time.  Husband has a hard time showing affection towards patient but has been that way for 50 years.  Comfortable with relationship with husband.    2.  Pancreatitis:  Stable; no abdominal pain; no n/v; appetite has returned; weight up seven pounds.  Follow-up with GI six months after last visit.  GI specialist states that all severe pancreatitis never suffers with recurrent pancreatitis.  Only difference is excessive sweating; bladder is not leaking.  Has been using powder in genital region.  Having three stools per day.  Dr. 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  Not on low fat diet.    3.  CAD: last follow-up with [facility]hem[facility] Massed six months ago; followed every six months.  No longer taking Lisinopril or HCTZ; taking Metoprolol.  Not checking BP at home.    4.  Hyperlipidemia:  Excellent control at last visit.  Fasting today; reports good compliance with medication; good tolerance to medication; good symptom control.  Non-compliant with low-fat diet.  5.  Anemia: resolved after last visit.  Feeling well. Never started iron supplement.   Review of Systems  Constitutional: Negative for chills, diaphoresis and fatigue.  Respiratory: Negative for cough, shortness of breath, wheezing and stridor.   Cardiovascular: Negative for chest pain, palpitations and leg swelling.  Gastrointestinal: Negative for nausea, vomiting, abdominal pain, diarrhea, constipation, blood in stool, abdominal distention and anal  bleeding.  Endocrine: Positive for heat intolerance. Negative for cold intolerance, polydipsia, polyphagia and polyuria.  Skin: Negative for rash.  Neurological: Negative for dizziness, tremors, syncope, speech difficulty, weakness, light-headedness, numbness and headaches.  Psychiatric/Behavioral: Negative for suicidal ideas, sleep disturbance, self-injury and dysphoric mood. The patient is not nervous/anxious.    Past Medical History  Diagnosis Date  . Anxiety   . Depression   . Hyperlipidemia   . Hypertension   . Degeneration of lumbar or lumbosacral intervertebral disc   . Other dyspnea and respiratory abnormality   . Other malaise and fatigue   . Benign neoplasm of colon   . Basal cell carcinoma   . Seborrheic dermatitis, unspecified   . Coronary atherosclerosis of native coronary artery   . Obesity, unspecified   . Tobacco use disorder   . Esophageal reflux   . Impaired glucose tolerance test   . Internal hemorrhoids without mention of complication   . Diverticulosis of colon (without mention of hemorrhage)   . Unspecified disorder of kidney and ureter   . Macular degeneration   . Pancreatitis due to biliary obstruction 08/05/2012    two week admission in [place].  Step down unit.   Past Surgical History  Procedure Laterality Date  . Cardiac catheterization  2006    80% blockage  . Dilatation and curettage  x 2  . Tubal ligation  1982  . Knee surgery      Right  . Eye lid surgery  2012  . Laser therapy and injections for macular degeneration    . Cholecystectomy    .  Admission  08/05/2012    Acute pancreatitis. [place]. Two week admission; step down unit.   Allergies  Allergen Reactions  . Augmentin [Amoxicillin-Pot Clavulanate] Hives   Current Outpatient Prescriptions on File Prior to Visit  Medication Sig Dispense Refill  . aspirin 81 MG tablet Take 81 mg by mouth daily.      . calcium carbonate (OS-CAL) 600 MG TABS Take 600 mg by mouth 2 (two) times daily  with a meal.      . Misc Natural Products (LUTEIN VISION BLEND PO) Take by mouth daily.      . Multiple Vitamin (MULTIVITAMIN) tablet Take 1 tablet by mouth daily.      . Multiple Vitamins-Minerals (ULTRA ANTIOXIDANT FORMULA PO) Take by mouth.       No current facility-administered medications on file prior to visit.   History   Social History  . Marital Status: Married    Spouse Name: N/A    Number of Children: 2  . Years of Education: 12   Occupational History  . substitute school teacher    Social History Main Topics  . Smoking status: Former Smoker -- 0.50 packs/day for 40 years    Types: Cigarettes    Quit date: 02/18/1990  . Smokeless tobacco: Not on file     Comment: quit 1991  . Alcohol Use: No     Comment: occasional twice a month;  per patient health survery form - No  . Drug Use: No  . Sexual Activity: Not Currently     Comment: due to atrophic vaginitis   Other Topics Concern  . Not on file   Social History Narrative   Marital status: married x 47 yrs, moderately happily married, no abuse, moved from [place] in 2007.  Husband with infidelity in past and in 2013.      Children: live in [place]      Lives: with husband.      Employment: unemployed.      Tobacco: none       Alcohol:  None       Drugs: none      Exercise: Minimal      Caffeine use: coffee 1 serving/day.      Always uses seat belts, smoke alarm in the home, no guns in the home.       Objective:   Physical Exam  Nursing note and vitals reviewed. Constitutional: She is oriented to person, place, and time. She appears well-developed and well-nourished. No distress.  HENT:  Head: Normocephalic and atraumatic.  Eyes: Conjunctivae are normal. Pupils are equal, round, and reactive to light.  Neck: Normal range of motion. Neck supple. No JVD present. Carotid bruit is not present. No thyromegaly present.  Cardiovascular: Normal rate, regular rhythm and normal heart sounds.  Exam reveals no gallop  and no friction rub.   No murmur heard. Pulmonary/Chest: Effort normal and breath sounds normal. She has no wheezes. She has no rales.  Abdominal: Soft. Bowel sounds are normal. She exhibits no distension and no mass. There is no tenderness. There is no rebound and no guarding.  Lymphadenopathy:    She has no cervical adenopathy.  Neurological: She is alert and oriented to person, place, and time.  Skin: She is not diaphoretic.  Psychiatric: She has a normal mood and affect. Her behavior is normal. Judgment and thought content normal.       Assessment & Plan:  Essential hypertension, benign - Plan: CBC with Differential, COMPLETE METABOLIC PANEL WITH GFR  Pure hypercholesterolemia - Plan: Lipid panel  Other abnormal glucose - Plan: Hemoglobin A1c  Coronary artery disease  Depression  Pancreatitis, acute  1. HTN: Controlled; obtain labs; refill of Metoprolol provided. 2.  Dyslipidemia: controlled; obtain labs; refills provided.  Recommend weight loss, low-fat and low-cholesterol food choices. 3. Glucose Intolerance: stable; obtain labs; recommend dietary modification. 4.  CAD: stable; asymptomatic currently; followed by [facility] Massed every six months. 5.  Depression with anxiety: controlled; no change in therapy; refill provided. 6.  Pancreatitis: resolved; follow-up appointment this month with GI.   7. Anemia: resolved.  Meds ordered this encounter  Medications  . rosuvastatin (CRESTOR) 40 MG tablet    Sig: Take 1 tablet (40 mg total) by mouth daily.    Dispense:  90 tablet    Refill:  3  . escitalopram (LEXAPRO) 20 MG tablet    Sig: Take 1 tablet (20 mg total) by mouth daily.    Dispense:  90 tablet    Refill:  3  . fenofibrate (TRICOR) 145 MG tablet    Sig: Take 1 tablet (145 mg total) by mouth daily.    Dispense:  90 tablet    Refill:  3  . esomeprazole (NEXIUM) 40 MG capsule    Sig: Take 1 capsule (40 mg total) by mouth daily at 12 noon.    Dispense:  90 capsule     Refill:  3  . meloxicam (MOBIC) 15 MG tablet    Sig: Take 1 tablet (15 mg total) by mouth daily.    Dispense:  90 tablet    Refill:  3  . metoprolol tartrate (LOPRESSOR) 25 MG tablet    Sig: Take 1 tablet (25 mg total) by mouth 2 (two) times daily.    Dispense:  180 tablet    Refill:  3   Reginia Forts, M.D.  [facility] [address] [place], [place]  [address] [phone] phone [phone] fax

## 2013-04-28 DIAGNOSIS — L219 Seborrheic dermatitis, unspecified: Secondary | ICD-10-CM | POA: Diagnosis not present

## 2013-04-28 DIAGNOSIS — L819 Disorder of pigmentation, unspecified: Secondary | ICD-10-CM | POA: Diagnosis not present

## 2013-04-28 DIAGNOSIS — L608 Other nail disorders: Secondary | ICD-10-CM | POA: Diagnosis not present

## 2013-04-28 DIAGNOSIS — L570 Actinic keratosis: Secondary | ICD-10-CM | POA: Diagnosis not present

## 2013-04-28 DIAGNOSIS — B359 Dermatophytosis, unspecified: Secondary | ICD-10-CM | POA: Diagnosis not present

## 2013-05-04 ENCOUNTER — Encounter: Payer: Self-pay | Admitting: Family Medicine

## 2013-05-05 NOTE — Telephone Encounter (Signed)
EPIC will not support post-menopausal state, estrogen deficiency, osteoporosis screening; order canceled.

## 2013-05-30 DIAGNOSIS — H353290 Exudative age-related macular degeneration, unspecified eye, stage unspecified: Secondary | ICD-10-CM | POA: Diagnosis not present

## 2013-06-23 DIAGNOSIS — I119 Hypertensive heart disease without heart failure: Secondary | ICD-10-CM | POA: Diagnosis not present

## 2013-06-23 DIAGNOSIS — I2510 Atherosclerotic heart disease of native coronary artery without angina pectoris: Secondary | ICD-10-CM | POA: Diagnosis not present

## 2013-06-23 DIAGNOSIS — E782 Mixed hyperlipidemia: Secondary | ICD-10-CM | POA: Diagnosis not present

## 2013-08-10 DIAGNOSIS — M4120 Other idiopathic scoliosis, site unspecified: Secondary | ICD-10-CM | POA: Diagnosis not present

## 2013-08-10 DIAGNOSIS — K859 Acute pancreatitis without necrosis or infection, unspecified: Secondary | ICD-10-CM | POA: Diagnosis not present

## 2013-08-10 DIAGNOSIS — K8689 Other specified diseases of pancreas: Secondary | ICD-10-CM | POA: Diagnosis not present

## 2013-08-18 DIAGNOSIS — L821 Other seborrheic keratosis: Secondary | ICD-10-CM | POA: Diagnosis not present

## 2013-08-18 DIAGNOSIS — L570 Actinic keratosis: Secondary | ICD-10-CM | POA: Diagnosis not present

## 2013-08-18 DIAGNOSIS — L723 Sebaceous cyst: Secondary | ICD-10-CM | POA: Diagnosis not present

## 2013-08-22 DIAGNOSIS — H353290 Exudative age-related macular degeneration, unspecified eye, stage unspecified: Secondary | ICD-10-CM | POA: Diagnosis not present

## 2013-09-07 DIAGNOSIS — H353190 Nonexudative age-related macular degeneration, unspecified eye, stage unspecified: Secondary | ICD-10-CM | POA: Diagnosis not present

## 2013-10-25 ENCOUNTER — Encounter: Payer: Medicare Other | Admitting: Family Medicine

## 2013-10-26 ENCOUNTER — Ambulatory Visit (INDEPENDENT_AMBULATORY_CARE_PROVIDER_SITE_OTHER): Payer: Medicare Other | Admitting: Family Medicine

## 2013-10-26 ENCOUNTER — Encounter: Payer: Self-pay | Admitting: Family Medicine

## 2013-10-26 VITALS — BP 140/60 | HR 58 | Temp 98.0°F | Resp 16 | Ht 66.75 in | Wt 242.4 lb

## 2013-10-26 DIAGNOSIS — Z0000 Encounter for general adult medical examination without abnormal findings: Secondary | ICD-10-CM

## 2013-10-26 DIAGNOSIS — Z13820 Encounter for screening for osteoporosis: Secondary | ICD-10-CM

## 2013-10-26 DIAGNOSIS — N959 Unspecified menopausal and perimenopausal disorder: Secondary | ICD-10-CM

## 2013-10-26 DIAGNOSIS — E7800 Pure hypercholesterolemia, unspecified: Secondary | ICD-10-CM | POA: Diagnosis not present

## 2013-10-26 DIAGNOSIS — I2510 Atherosclerotic heart disease of native coronary artery without angina pectoris: Secondary | ICD-10-CM | POA: Diagnosis not present

## 2013-10-26 DIAGNOSIS — Z780 Asymptomatic menopausal state: Secondary | ICD-10-CM

## 2013-10-26 DIAGNOSIS — I10 Essential (primary) hypertension: Secondary | ICD-10-CM | POA: Diagnosis not present

## 2013-10-26 DIAGNOSIS — N952 Postmenopausal atrophic vaginitis: Secondary | ICD-10-CM

## 2013-10-26 DIAGNOSIS — R7309 Other abnormal glucose: Secondary | ICD-10-CM

## 2013-10-26 DIAGNOSIS — Z23 Encounter for immunization: Secondary | ICD-10-CM

## 2013-10-26 DIAGNOSIS — Z86010 Personal history of colonic polyps: Secondary | ICD-10-CM

## 2013-10-26 LAB — CBC
HCT: 37.7 % (ref 36.0–46.0)
Hemoglobin: 12.8 g/dL (ref 12.0–15.0)
MCH: 29.6 pg (ref 26.0–34.0)
MCHC: 34 g/dL (ref 30.0–36.0)
MCV: 87.1 fL (ref 78.0–100.0)
PLATELETS: 294 10*3/uL (ref 150–400)
RBC: 4.33 MIL/uL (ref 3.87–5.11)
RDW: 13.8 % (ref 11.5–15.5)
WBC: 4.7 10*3/uL (ref 4.0–10.5)

## 2013-10-26 LAB — COMPREHENSIVE METABOLIC PANEL
ALBUMIN: 4.4 g/dL (ref 3.5–5.2)
ALT: 16 U/L (ref 0–35)
AST: 15 U/L (ref 0–37)
Alkaline Phosphatase: 50 U/L (ref 39–117)
BUN: 25 mg/dL — ABNORMAL HIGH (ref 6–23)
CALCIUM: 9.6 mg/dL (ref 8.4–10.5)
CHLORIDE: 106 meq/L (ref 96–112)
CO2: 25 mEq/L (ref 19–32)
Creat: 1.33 mg/dL — ABNORMAL HIGH (ref 0.50–1.10)
Glucose, Bld: 107 mg/dL — ABNORMAL HIGH (ref 70–99)
POTASSIUM: 4.8 meq/L (ref 3.5–5.3)
Sodium: 140 mEq/L (ref 135–145)
Total Bilirubin: 0.6 mg/dL (ref 0.2–1.2)
Total Protein: 6.7 g/dL (ref 6.0–8.3)

## 2013-10-26 LAB — HEMOGLOBIN A1C
Hgb A1c MFr Bld: 6.5 % — ABNORMAL HIGH (ref <5.7)
Mean Plasma Glucose: 140 mg/dL — ABNORMAL HIGH (ref <117)

## 2013-10-26 LAB — LIPID PANEL
CHOL/HDL RATIO: 3.2 ratio
Cholesterol: 165 mg/dL (ref 0–200)
HDL: 51 mg/dL (ref >39)
LDL Cholesterol: 89 mg/dL (ref 0–99)
TRIGLYCERIDES: 124 mg/dL (ref <150)
VLDL: 25 mg/dL (ref 0–40)

## 2013-10-26 LAB — POCT URINALYSIS DIPSTICK
Bilirubin, UA: NEGATIVE
Glucose, UA: NEGATIVE
Ketones, UA: NEGATIVE
NITRITE UA: NEGATIVE
PROTEIN UA: NEGATIVE
SPEC GRAV UA: 1.025
UROBILINOGEN UA: 0.2
pH, UA: 6

## 2013-10-26 MED ORDER — ESTRADIOL 0.1 MG/GM VA CREA: 1.0000 | TOPICAL_CREAM | VAGINAL | Status: DC

## 2013-10-26 NOTE — Progress Notes (Signed)
Subjective:  This chart was scribed for Gail Forts, MD by Roxan Diesel, Scribe.  This patient was seen in [facility] and the patient's care was started at 9:57 AM.   Patient ID: Vanetta Shawl, female    DOB: 08-31-1945, 68 y.o.   MRN: [id number]  10/26/2013  Annual Exam, Hyperlipidemia, Hypertension and Depression   HPI  HPI Comments: Anupama Piehl is a 68 y.o. female who presents to [facility] for an annual physical.  Last physical was 09/20/12.  Last visit with me was January 2015;  no changes to management were made at last visit.  Overall she has been doing well over the past 6 months.  She has had no pancreas issues in that time but is concerned her pancreatitis may recur if she cannot stop overeating.  She feels better than she did at her last visit and states "I just don't look good" due to being overweight.  Health maintenance: Pap smear was 6/14 as well.  Mammogram was 12/29/12.  Colonoscopy was 2010 (showed hemorrhoids and diverticulosis Iftikhar, advised to repeat in 5 years, she had had polyps previously).  Pneumonia shot was 2011.  Tetanus was 2009.  Shingles vaccine was 2012.  Flu shot was September 2014.  Last seen by ophthalmologist (Dr. Garwin Brothers at [facility]) about 1 month ago and is scheduled to receive an injection for macular degeneration soon.  She is also developing a cataract but they are unsure whether to proceed with surgery on this due to her comorbid macular degeneration.  She saw her dentist about 2 weeks ago.  She has not had a bone density scan recently.  She saw her cardiologist Dr. Nehemiah Massed recently.  She sees him 2x/year.  She has h/o precancerous skin spots and sees a dermatologist (Dr. Tyler Deis) more than once per year.  She has never had a sleep study.  She does snore and sometimes wakes herself up from snoring.  However she wakes up feeling rested.  She does take afternoon naps ever since she had pancreatitis.  She has not been told that she stops breathing at night.  She  notes she has been sweating more than usual recently.  Even with light exertion around the house with the air conditioner on she can break out in a heavy sweat.  She denies any falls in the past year.  She lives independently and is able to perform ADLs.  Hypertension and CAD: On Lopressor.  Patient reports good compliance with medication, good tolerance to medication, and good symptom control.    High cholesterol: On Tricor and Crestor. Patient reports good compliance with medication, good tolerance to medication, and good symptom control.    Prediabetes: Diet-controlled right now.  She is having trouble with this (see below).  Depression: On Lexapro.  Patient reports good compliance with medication, good tolerance to medication, and good symptom control.    Weight: Pt states she continues to have trouble with her weight.  She states "it's getting worse, I can't stop eating."  At the beginning of every day she feels "today's a new day," but she then overeats.  She feels that she may be "addicted" to food.  She is not exercising very much and has not been to a gym in about 2 months.  She feels unhappy about her weight and states "if I pass a mirror in the mall I just say, what's wrong with me?"    Family history: Her sister has HTN and poorly-controlled high cholesterol and recently had  back surgery.  She has had no heart issues.  Other sister had a stroke and had open-heart surgery at about age 55 (no known heart attack per pt).  She is now 44.  Pt's brother had open-heart surgery and has COPD.  He is very overweight.  He is 75.  Social: She and her husband are celebrating their 50th anniversary in August.  She has 2 children and they both live here.  She has 4 grandchildren.    She has a living will.  She states she would want CPR and ventilator placement in the event of cardiac arrest if there were a chance of survival without life support, but she would not want her life to be prolonged if there  were no chance of her coming off of the ventilator.  She states her husband and children are aware of these wishes.    Review of Systems  Constitutional:       Sweating more than usual  HENT: Negative for mouth sores.   Respiratory: Positive for shortness of breath (when walking). Negative for cough.   Cardiovascular: Negative for chest pain and palpitations.  Neurological: Negative for dizziness and headaches.     Past Medical History  Diagnosis Date  . Anxiety   . Depression   . Hyperlipidemia   . Hypertension   . Degeneration of lumbar or lumbosacral intervertebral disc   . Other dyspnea and respiratory abnormality   . Other malaise and fatigue   . Benign neoplasm of colon   . Basal cell carcinoma   . Seborrheic dermatitis, unspecified   . Coronary atherosclerosis of native coronary artery   . Obesity, unspecified   . Tobacco use disorder   . Esophageal reflux   . Impaired glucose tolerance test   . Internal hemorrhoids without mention of complication   . Diverticulosis of colon (without mention of hemorrhage)   . Unspecified disorder of kidney and ureter   . Macular degeneration   . Pancreatitis due to biliary obstruction 08/05/2012    two week admission in [place].  Step down unit.   Past Surgical History  Procedure Laterality Date  . Cardiac catheterization  2006    80% blockage  . Dilatation and curettage  x 2  . Tubal ligation  1982  . Knee surgery      Right  . Eye lid surgery  2012  . Laser therapy and injections for macular degeneration    . Cholecystectomy    . Admission  08/05/2012    Acute pancreatitis. [place]. Two week admission; step down unit.  . Eye surgery      per patient's health survey - [facility]   Allergies  Allergen Reactions  . Augmentin [Amoxicillin-Pot Clavulanate] Hives   Current Outpatient Prescriptions  Medication Sig Dispense Refill  . aspirin 81 MG tablet Take 81 mg by mouth daily.      . calcium carbonate (OS-CAL) 600 MG TABS  Take 600 mg by mouth 2 (two) times daily with a meal.      . escitalopram (LEXAPRO) 20 MG tablet Take 1 tablet (20 mg total) by mouth daily.  90 tablet  3  . esomeprazole (NEXIUM) 40 MG capsule Take 1 capsule (40 mg total) by mouth daily at 12 noon.  90 capsule  3  . fenofibrate (TRICOR) 145 MG tablet Take 1 tablet (145 mg total) by mouth daily.  90 tablet  3  . meloxicam (MOBIC) 15 MG tablet Take 1 tablet (15 mg total) by mouth  daily.  90 tablet  3  . metoprolol tartrate (LOPRESSOR) 25 MG tablet Take 1 tablet (25 mg total) by mouth 2 (two) times daily.  180 tablet  3  . Misc Natural Products (LUTEIN VISION BLEND PO) Take by mouth daily.      . Multiple Vitamin (MULTIVITAMIN) tablet Take 1 tablet by mouth daily.      . Multiple Vitamins-Minerals (ULTRA ANTIOXIDANT FORMULA PO) Take by mouth.      . rosuvastatin (CRESTOR) 40 MG tablet Take 1 tablet (40 mg total) by mouth daily.  90 tablet  3  . estradiol (ESTRACE) 0.1 MG/GM vaginal cream Place 1 Applicatorful vaginally once a week.  42.5 g  12   No current facility-administered medications for this visit.   History   Social History  . Marital Status: Married    Spouse Name: N/A    Number of Children: 2  . Years of Education: 12   Occupational History  . substitute school teacher    Social History Main Topics  . Smoking status: Former Smoker -- 0.50 packs/day for 40 years    Types: Cigarettes    Quit date: 02/18/1990  . Smokeless tobacco: Not on file     Comment: quit 1991  . Alcohol Use: No     Comment: occasional twice a month;  per patient health survery form - No  . Drug Use: No  . Sexual Activity: Not Currently     Comment: due to atrophic vaginitis   Other Topics Concern  . Not on file   Social History Narrative   Marital status: married x 50 yrs, moderately happily married, no abuse, moved from [place] in 2007.  Husband with infidelity in past and in 2013.      Children:2 children; 4 grandchildren;  live in [place]       Lives: with husband.      Employment: unemployed.      Tobacco: none       Alcohol:  None       Drugs: none      Exercise: Minimal      Caffeine use: coffee 1 serving/day.      Always uses seat belts, smoke alarm in the home, no guns in the home.      ADLs: independent; drives.      Living Will:  FULL CODE; no prolonged measures. Has living will.     Family History  Problem Relation Age of Onset  . Cancer Mother   . Heart disease Mother       CAD     has pacemaker  . Dementia Mother   . Diabetes Mother   . Macular degeneration Mother   . Heart disease Father   . Rheumatic fever Father   . Heart disease Sister 51    CABG  . Hyperlipidemia Sister   . Stroke Sister 1    TIA's  . Heart disease Brother     CABG age 9  . COPD Brother   . Heart disease Maternal Grandfather   . Hyperlipidemia Sister   . Hypertension Sister   . Arthritis Sister     DDD lumbar s/p surgery  . Parkinson's disease Maternal Grandmother   . Heart disease Paternal Grandmother        Objective:    BP 140/60  Pulse 58  Temp(Src) 98 F (36.7 C) (Oral)  Resp 16  Ht 5' 6.75" (1.695 m)  Wt 242 lb 6.4 oz (109.952 kg)  BMI 38.27 kg/m2  SpO2 96%  Physical Exam  Nursing note and vitals reviewed. Constitutional: She is oriented to person, place, and time. She appears well-developed and well-nourished. No distress.  HENT:  Head: Normocephalic and atraumatic.  Right Ear: Tympanic membrane, external ear and ear canal normal.  Left Ear: Tympanic membrane, external ear and ear canal normal.  Nose: Nose normal.  Mouth/Throat: Uvula is midline, oropharynx is clear and moist and mucous membranes are normal. No oropharyngeal exudate, posterior oropharyngeal edema or posterior oropharyngeal erythema.  Eyes: Conjunctivae and EOM are normal. Pupils are equal, round, and reactive to light. Right eye exhibits no discharge. Left eye exhibits no discharge.  Neck: Normal range of motion and full passive range of  motion without pain. Neck supple. No JVD present. Carotid bruit is not present. No tracheal deviation present. No thyromegaly present.  Cardiovascular: Normal rate, regular rhythm and normal heart sounds.  Exam reveals no gallop and no friction rub.   No murmur heard. Pulmonary/Chest: Effort normal and breath sounds normal. No respiratory distress. She has no wheezes. She has no rales. Right breast exhibits no inverted nipple, no mass, no nipple discharge, no skin change and no tenderness. Left breast exhibits no inverted nipple, no mass, no nipple discharge, no skin change and no tenderness. Breasts are symmetrical.  Abdominal: Soft. Bowel sounds are normal. She exhibits no distension and no mass. There is no tenderness. There is no rebound and no guarding.  Genitourinary: Vagina normal and uterus normal. There is no rash, tenderness or lesion on the right labia. There is no rash, tenderness or lesion on the left labia. Cervix exhibits no motion tenderness, no discharge and no friability. Right adnexum displays no mass, no tenderness and no fullness. Left adnexum displays no mass, no tenderness and no fullness.  Musculoskeletal: Normal range of motion.       Right shoulder: Normal.       Left shoulder: Normal.       Cervical back: Normal.  Lymphadenopathy:    She has no cervical adenopathy.  Neurological: She is alert and oriented to person, place, and time. She has normal reflexes. No cranial nerve deficit. She exhibits normal muscle tone. Coordination normal.  Skin: Skin is warm and dry. No rash noted. She is not diaphoretic. No erythema. No pallor.  Diffuse sun related changes of arms, torso.  Psychiatric: She has a normal mood and affect. Her behavior is normal. Judgment and thought content normal.    Results for orders placed in visit on 10/26/13  CBC      Result Value Ref Range   WBC 4.7  4.0 - 10.5 K/uL   RBC 4.33  3.87 - 5.11 MIL/uL   Hemoglobin 12.8  12.0 - 15.0 g/dL   HCT 37.7   36.0 - 46.0 %   MCV 87.1  78.0 - 100.0 fL   MCH 29.6  26.0 - 34.0 pg   MCHC 34.0  30.0 - 36.0 g/dL   RDW 13.8  11.5 - 15.5 %   Platelets 294  150 - 400 K/uL  COMPREHENSIVE METABOLIC PANEL      Result Value Ref Range   Sodium 140  135 - 145 mEq/L   Potassium 4.8  3.5 - 5.3 mEq/L   Chloride 106  96 - 112 mEq/L   CO2 25  19 - 32 mEq/L   Glucose, Bld 107 (*) 70 - 99 mg/dL   BUN 25 (*) 6 - 23 mg/dL   Creat 1.33 (*) 0.50 - 1.10 mg/dL  Total Bilirubin 0.6  0.2 - 1.2 mg/dL   Alkaline Phosphatase 50  39 - 117 U/L   AST 15  0 - 37 U/L   ALT 16  0 - 35 U/L   Total Protein 6.7  6.0 - 8.3 g/dL   Albumin 4.4  3.5 - 5.2 g/dL   Calcium 9.6  8.4 - 10.5 mg/dL  HEMOGLOBIN A1C      Result Value Ref Range   Hemoglobin A1C 6.5 (*) <5.7 %   Mean Plasma Glucose 140 (*) <117 mg/dL  LIPID PANEL      Result Value Ref Range   Cholesterol 165  0 - 200 mg/dL   Triglycerides 124  <150 mg/dL   HDL 51  >39 mg/dL   Total CHOL/HDL Ratio 3.2     VLDL 25  0 - 40 mg/dL   LDL Cholesterol 89  0 - 99 mg/dL  POCT URINALYSIS DIPSTICK      Result Value Ref Range   Color, UA yellow     Clarity, UA cloudy     Glucose, UA neg     Bilirubin, UA neg     Ketones, UA neg     Spec Grav, UA 1.025     Blood, UA small     pH, UA 6.0     Protein, UA neg     Urobilinogen, UA 0.2     Nitrite, UA neg     Leukocytes, UA moderate (2+)     PREVNAR 13 ADMINISTERED DURING VISIT.    Assessment & Plan:   1. Routine general medical examination at a health care facility   2. Essential hypertension, benign   3. Pure hypercholesterolemia   4. Other abnormal glucose   5. Atrophic vaginitis   6. Personal history of colonic polyps   7. Post-menopausal   8. Need for prophylactic vaccination against Streptococcus pneumoniae (pneumococcus)   9. Coronary artery disease involving native coronary artery of native heart without angina pectoris    1. Annual Wellness Exam: Anticipatory guidance provided ---- weight loss, exercise;  bring in copy of living will for chart.  Pap smear UTD; will repeat pap smear next year and then will stop.  Mammogram UTD.  Due for colonoscopy and will place referral.  S/p Prevnar in office. Refer for bone density scanning.  No hearing loss; low fall risk.  Being treated for depression.  Independent with ADLs.  Living will; desires full code but no prolonged measures. 2. HTN: controlled; obtain labs; continue current medications. 3.  Hypercholesterolemia: controlled; obtain labs; refills provided. 4.  Glucose intolerance; stable; obtain labs; continues to gain weight; dietary modification encouraged. 5.  Atrophic Vaginitis: worsening; rx for Estrace vaginal cream once weekly prescribed. 6.  Personal history of colon polyps: due for repeat colonoscopy; last colonoscopy 2010. 7.  CAD: stable; followed by cardiology every six months; asymptomatic. 8.  S/p Prevnar 13 in office.   Meds ordered this encounter  Medications  . estradiol (ESTRACE) 0.1 MG/GM vaginal cream    Sig: Place 1 Applicatorful vaginally once a week.    Dispense:  42.5 g    Refill:  12    Return in about 6 months (around 04/28/2014) for recheck.    I personally performed the services described in this documentation, which was scribed in my presence.  The recorded information has been reviewed and is accurate.  Gail Burns, M.D.  [facility] [address] [place], [place]  [address] [phone]  [phone] phone [phone] fax

## 2013-10-26 NOTE — Patient Instructions (Signed)
1. Bring copy of living will to your next appointment.

## 2013-10-26 NOTE — Progress Notes (Signed)
   Subjective:    Patient ID: Gail Burns, female    DOB: [birth year], 68 y.o.   MRN: [id number]  HPI    Review of Systems  Constitutional: Negative.   HENT: Negative.   Eyes: Negative.   Respiratory: Negative.   Cardiovascular: Negative.   Gastrointestinal: Negative.   Endocrine: Negative.   Genitourinary: Negative.   Musculoskeletal: Negative.   Skin: Negative.   Allergic/Immunologic: Negative.   Neurological: Negative.   Hematological: Negative.   Psychiatric/Behavioral: Negative.        Objective:   Physical Exam        Assessment & Plan:

## 2013-10-27 ENCOUNTER — Encounter: Payer: Self-pay | Admitting: Family Medicine

## 2013-10-27 DIAGNOSIS — N959 Unspecified menopausal and perimenopausal disorder: Secondary | ICD-10-CM | POA: Insufficient documentation

## 2013-10-31 DIAGNOSIS — H353190 Nonexudative age-related macular degeneration, unspecified eye, stage unspecified: Secondary | ICD-10-CM | POA: Diagnosis not present

## 2013-10-31 DIAGNOSIS — H353290 Exudative age-related macular degeneration, unspecified eye, stage unspecified: Secondary | ICD-10-CM | POA: Diagnosis not present

## 2013-11-08 ENCOUNTER — Encounter: Payer: Self-pay | Admitting: *Deleted

## 2013-11-15 ENCOUNTER — Telehealth: Payer: Self-pay

## 2013-11-15 DIAGNOSIS — K219 Gastro-esophageal reflux disease without esophagitis: Secondary | ICD-10-CM

## 2013-11-15 MED ORDER — ESOMEPRAZOLE MAGNESIUM 40 MG PO CPDR: 40.0000 mg | DELAYED_RELEASE_CAPSULE | Freq: Every day | ORAL | Status: DC

## 2013-11-15 NOTE — Telephone Encounter (Signed)
PT STATES SHE USUALLY GET HER MEDICATION THROUGH [facility] BUT FOR SOME REASON SHE THINK THEY MAY HAVE DROPPED HER. WOULD LIKE TO HAVE A WEEK'S SUPPLY OF HER NEXIUM 40MG S, TRICOR 145 MGS, CRESTOR 30MG S, LEXAPRO 20MG S AND MOBIC 15MG S. PLEASE CALL PT AT [phone]    [facility] ON [address] IN [place] AND SHE DOESN'T KNOW THE NUMBER

## 2013-11-15 NOTE — Telephone Encounter (Signed)
Spoke to pt, she was accidentally taken off mail order, it was their mistake, her refill will be at her residence by the 17th , but she is out of nexium, i have sent refilled this for 1 week.

## 2013-11-28 DIAGNOSIS — H353190 Nonexudative age-related macular degeneration, unspecified eye, stage unspecified: Secondary | ICD-10-CM | POA: Diagnosis not present

## 2013-11-28 DIAGNOSIS — H353290 Exudative age-related macular degeneration, unspecified eye, stage unspecified: Secondary | ICD-10-CM | POA: Diagnosis not present

## 2013-12-02 ENCOUNTER — Encounter: Payer: Self-pay | Admitting: Radiology

## 2013-12-14 DIAGNOSIS — Z86010 Personal history of colonic polyps: Secondary | ICD-10-CM | POA: Diagnosis not present

## 2013-12-22 ENCOUNTER — Ambulatory Visit: Payer: Self-pay | Admitting: Unknown Physician Specialty

## 2013-12-22 DIAGNOSIS — Z79899 Other long term (current) drug therapy: Secondary | ICD-10-CM | POA: Diagnosis not present

## 2013-12-22 DIAGNOSIS — Z881 Allergy status to other antibiotic agents status: Secondary | ICD-10-CM | POA: Diagnosis not present

## 2013-12-22 DIAGNOSIS — K648 Other hemorrhoids: Secondary | ICD-10-CM | POA: Diagnosis not present

## 2013-12-22 DIAGNOSIS — H3530 Unspecified macular degeneration: Secondary | ICD-10-CM | POA: Diagnosis not present

## 2013-12-22 DIAGNOSIS — Z7982 Long term (current) use of aspirin: Secondary | ICD-10-CM | POA: Diagnosis not present

## 2013-12-22 DIAGNOSIS — D126 Benign neoplasm of colon, unspecified: Secondary | ICD-10-CM | POA: Diagnosis not present

## 2013-12-22 DIAGNOSIS — Z85828 Personal history of other malignant neoplasm of skin: Secondary | ICD-10-CM | POA: Diagnosis not present

## 2013-12-22 DIAGNOSIS — I2510 Atherosclerotic heart disease of native coronary artery without angina pectoris: Secondary | ICD-10-CM | POA: Diagnosis not present

## 2013-12-22 DIAGNOSIS — E7800 Pure hypercholesterolemia, unspecified: Secondary | ICD-10-CM | POA: Diagnosis not present

## 2013-12-22 DIAGNOSIS — Z86010 Personal history of colonic polyps: Secondary | ICD-10-CM | POA: Diagnosis not present

## 2013-12-22 DIAGNOSIS — I10 Essential (primary) hypertension: Secondary | ICD-10-CM | POA: Diagnosis not present

## 2013-12-22 DIAGNOSIS — Z09 Encounter for follow-up examination after completed treatment for conditions other than malignant neoplasm: Secondary | ICD-10-CM | POA: Diagnosis not present

## 2013-12-22 DIAGNOSIS — Z1211 Encounter for screening for malignant neoplasm of colon: Secondary | ICD-10-CM | POA: Diagnosis not present

## 2013-12-22 DIAGNOSIS — K5730 Diverticulosis of large intestine without perforation or abscess without bleeding: Secondary | ICD-10-CM | POA: Diagnosis not present

## 2013-12-22 LAB — HM COLONOSCOPY

## 2013-12-23 LAB — PATHOLOGY REPORT

## 2013-12-30 ENCOUNTER — Encounter: Payer: Self-pay | Admitting: *Deleted

## 2013-12-30 DIAGNOSIS — Z860100 Personal history of colon polyps, unspecified: Secondary | ICD-10-CM | POA: Insufficient documentation

## 2014-01-05 ENCOUNTER — Encounter: Payer: Self-pay | Admitting: Family Medicine

## 2014-01-09 DIAGNOSIS — Z006 Encounter for examination for normal comparison and control in clinical research program: Secondary | ICD-10-CM | POA: Diagnosis not present

## 2014-01-09 DIAGNOSIS — H3532 Exudative age-related macular degeneration: Secondary | ICD-10-CM | POA: Diagnosis not present

## 2014-01-09 DIAGNOSIS — H3531 Nonexudative age-related macular degeneration: Secondary | ICD-10-CM | POA: Diagnosis not present

## 2014-01-23 ENCOUNTER — Encounter: Payer: Self-pay | Admitting: *Deleted

## 2014-01-23 DIAGNOSIS — R933 Abnormal findings on diagnostic imaging of other parts of digestive tract: Secondary | ICD-10-CM

## 2014-01-31 DIAGNOSIS — E782 Mixed hyperlipidemia: Secondary | ICD-10-CM | POA: Insufficient documentation

## 2014-01-31 DIAGNOSIS — I10 Essential (primary) hypertension: Secondary | ICD-10-CM | POA: Diagnosis not present

## 2014-01-31 DIAGNOSIS — I2510 Atherosclerotic heart disease of native coronary artery without angina pectoris: Secondary | ICD-10-CM | POA: Diagnosis not present

## 2014-01-31 DIAGNOSIS — K219 Gastro-esophageal reflux disease without esophagitis: Secondary | ICD-10-CM | POA: Diagnosis not present

## 2014-02-22 DIAGNOSIS — L570 Actinic keratosis: Secondary | ICD-10-CM | POA: Diagnosis not present

## 2014-02-22 DIAGNOSIS — L820 Inflamed seborrheic keratosis: Secondary | ICD-10-CM | POA: Diagnosis not present

## 2014-02-23 ENCOUNTER — Ambulatory Visit (INDEPENDENT_AMBULATORY_CARE_PROVIDER_SITE_OTHER): Payer: Medicare Other | Admitting: Family Medicine

## 2014-02-23 VITALS — BP 138/70 | HR 110 | Temp 97.9°F | Resp 18 | Ht 67.0 in | Wt 246.6 lb

## 2014-02-23 DIAGNOSIS — J0100 Acute maxillary sinusitis, unspecified: Secondary | ICD-10-CM | POA: Diagnosis not present

## 2014-02-23 DIAGNOSIS — I2510 Atherosclerotic heart disease of native coronary artery without angina pectoris: Secondary | ICD-10-CM

## 2014-02-23 MED ORDER — IPRATROPIUM BROMIDE 0.03 % NA SOLN: 2.0000 | Freq: Two times a day (BID) | NASAL | Status: DC

## 2014-02-23 MED ORDER — GUAIFENESIN-CODEINE 100-10 MG/5ML PO SOLN: 5.0000 mL | ORAL | Status: DC | PRN

## 2014-02-23 MED ORDER — DOXYCYCLINE HYCLATE 100 MG PO CAPS
100.0000 mg | ORAL_CAPSULE | Freq: Two times a day (BID) | ORAL | Status: DC
Start: 2014-02-23 — End: 2014-04-26

## 2014-02-23 NOTE — Patient Instructions (Signed)
1. Continue Mucinex DM twice daily.   Sinusitis Sinusitis is redness, soreness, and inflammation of the paranasal sinuses. Paranasal sinuses are air pockets within the bones of your face (beneath the eyes, the middle of the forehead, or above the eyes). In healthy paranasal sinuses, mucus is able to drain out, and air is able to circulate through them by way of your nose. However, when your paranasal sinuses are inflamed, mucus and air can become trapped. This can allow bacteria and other germs to grow and cause infection. Sinusitis can develop quickly and last only a short time (acute) or continue over a long period (chronic). Sinusitis that lasts for more than 12 weeks is considered chronic.  CAUSES  Causes of sinusitis include:  Allergies.  Structural abnormalities, such as displacement of the cartilage that separates your nostrils (deviated septum), which can decrease the air flow through your nose and sinuses and affect sinus drainage.  Functional abnormalities, such as when the small hairs (cilia) that line your sinuses and help remove mucus do not work properly or are not present. SIGNS AND SYMPTOMS  Symptoms of acute and chronic sinusitis are the same. The primary symptoms are pain and pressure around the affected sinuses. Other symptoms include:  Upper toothache.  Earache.  Headache.  Bad breath.  Decreased sense of smell and taste.  A cough, which worsens when you are lying flat.  Fatigue.  Fever.  Thick drainage from your nose, which often is green and may contain pus (purulent).  Swelling and warmth over the affected sinuses. DIAGNOSIS  Your health care provider will perform a physical exam. During the exam, your health care provider may:  Look in your nose for signs of abnormal growths in your nostrils (nasal polyps).  Tap over the affected sinus to check for signs of infection.  View the inside of your sinuses (endoscopy) using an imaging device that has a  light attached (endoscope). If your health care provider suspects that you have chronic sinusitis, one or more of the following tests may be recommended:  Allergy tests.  Nasal culture. A sample of mucus is taken from your nose, sent to a lab, and screened for bacteria.  Nasal cytology. A sample of mucus is taken from your nose and examined by your health care provider to determine if your sinusitis is related to an allergy. TREATMENT  Most cases of acute sinusitis are related to a viral infection and will resolve on their own within 10 days. Sometimes medicines are prescribed to help relieve symptoms (pain medicine, decongestants, nasal steroid sprays, or saline sprays).  However, for sinusitis related to a bacterial infection, your health care provider will prescribe antibiotic medicines. These are medicines that will help kill the bacteria causing the infection.  Rarely, sinusitis is caused by a fungal infection. In theses cases, your health care provider will prescribe antifungal medicine. For some cases of chronic sinusitis, surgery is needed. Generally, these are cases in which sinusitis recurs more than 3 times per year, despite other treatments. HOME CARE INSTRUCTIONS   Drink plenty of water. Water helps thin the mucus so your sinuses can drain more easily.  Use a humidifier.  Inhale steam 3 to 4 times a day (for example, sit in the bathroom with the shower running).  Apply a warm, moist washcloth to your face 3 to 4 times a day, or as directed by your health care provider.  Use saline nasal sprays to help moisten and clean your sinuses.  Take medicines only  as directed by your health care provider.  If you were prescribed either an antibiotic or antifungal medicine, finish it all even if you start to feel better. SEEK IMMEDIATE MEDICAL CARE IF:  You have increasing pain or severe headaches.  You have nausea, vomiting, or drowsiness.  You have swelling around your  face.  You have vision problems.  You have a stiff neck.  You have difficulty breathing. MAKE SURE YOU:   Understand these instructions.  Will watch your condition.  Will get help right away if you are not doing well or get worse. Document Released: 03/24/2005 Document Revised: 08/08/2013 Document Reviewed: 04/08/2011 [facility] Patient Information 2015 [place], [place]. This information is not intended to replace advice given to you by your health care provider. Make sure you discuss any questions you have with your health care provider.

## 2014-02-23 NOTE — Progress Notes (Signed)
Subjective:  This chart was scribed for Reginia Forts, MD by Randa Evens, ED Scribe. This Patient was seen in room 12 and the patients care was started at 5:20 PM   Patient ID: Gail Burns, female    DOB: 04-May-1945, 68 y.o.   MRN: [id number]  02/23/2014  Cough and Sinus Congestion  HPI HPI Comments: Gail Burns is a 68 y.o. female who presents to the [facility] complaining of productive cough with clear and green sputum onset 1 week ago. She state she has associated congestion, sore throat, headache, rib pain brought on from coughing. She states she has tried OTC medications Mucinex and delsym with no relief. She states that her congestion is worse when laying down. Denies ear pain, new SOB, vomiting, diarrhea, chest pain, or leg swelling.      Review of Systems  Constitutional: Negative for fever, chills and diaphoresis.  HENT: Positive for congestion, postnasal drip, rhinorrhea, sinus pressure, sore throat and voice change. Negative for ear pain and trouble swallowing.   Respiratory: Positive for cough. Negative for shortness of breath.   Cardiovascular: Positive for chest pain. Negative for leg swelling.  Gastrointestinal: Negative for nausea, vomiting and diarrhea.  Skin: Negative for rash.  Neurological: Positive for headaches.    Past Medical History  Diagnosis Date  . Anxiety   . Depression   . Hyperlipidemia   . Hypertension   . Degeneration of lumbar or lumbosacral intervertebral disc   . Other dyspnea and respiratory abnormality   . Other malaise and fatigue   . Benign neoplasm of colon   . Basal cell carcinoma   . Seborrheic dermatitis, unspecified   . Coronary atherosclerosis of native coronary artery   . Obesity, unspecified   . Tobacco use disorder   . Esophageal reflux   . Impaired glucose tolerance test   . Internal hemorrhoids without mention of complication   . Diverticulosis of colon (without mention of hemorrhage)   .  Unspecified disorder of kidney and ureter   . Macular degeneration   . Pancreatitis due to biliary obstruction 08/05/2012    two week admission in [place].  Step down unit.   Past Surgical History  Procedure Laterality Date  . Cardiac catheterization  2006    80% blockage  . Dilatation and curettage  x 2  . Tubal ligation  1982  . Knee surgery      Right  . Eye lid surgery  2012  . Laser therapy and injections for macular degeneration    . Cholecystectomy    . Admission  08/05/2012    Acute pancreatitis. [place]. Two week admission; step down unit.  . Eye surgery      per patient's health survey - [facility]   Allergies  Allergen Reactions  . Augmentin [Amoxicillin-Pot Clavulanate] Hives   Current Outpatient Prescriptions  Medication Sig Dispense Refill  . aspirin 81 MG tablet Take 81 mg by mouth daily.    . calcium carbonate (OS-CAL) 600 MG TABS Take 600 mg by mouth 2 (two) times daily with a meal.    . escitalopram (LEXAPRO) 20 MG tablet Take 1 tablet (20 mg total) by mouth daily. 90 tablet 3  . esomeprazole (NEXIUM) 40 MG capsule Take 1 capsule (40 mg total) by mouth daily at 12 noon. 90 capsule 3  . esomeprazole (NEXIUM) 40 MG capsule Take 1 capsule (40 mg total) by mouth daily. 7 capsule 0  . estradiol (ESTRACE) 0.1 MG/GM vaginal cream  Place 1 Applicatorful vaginally once a week. 42.5 g 12  . fenofibrate (TRICOR) 145 MG tablet Take 1 tablet (145 mg total) by mouth daily. 90 tablet 3  . meloxicam (MOBIC) 15 MG tablet Take 1 tablet (15 mg total) by mouth daily. 90 tablet 3  . metoprolol tartrate (LOPRESSOR) 25 MG tablet Take 1 tablet (25 mg total) by mouth 2 (two) times daily. 180 tablet 3  . Misc Natural Products (LUTEIN VISION BLEND PO) Take by mouth daily.    . Multiple Vitamin (MULTIVITAMIN) tablet Take 1 tablet by mouth daily.    . Multiple Vitamins-Minerals (ULTRA ANTIOXIDANT FORMULA PO) Take by mouth.    . rosuvastatin (CRESTOR) 40 MG tablet Take 1 tablet (40 mg total) by  mouth daily. 90 tablet 3  . doxycycline (VIBRAMYCIN) 100 MG capsule Take 1 capsule (100 mg total) by mouth 2 (two) times daily. 20 capsule 0  . guaiFENesin-codeine 100-10 MG/5ML syrup Take 5 mLs by mouth every 4 (four) hours as needed for cough. 180 mL 0  . ipratropium (ATROVENT) 0.03 % nasal spray Place 2 sprays into the nose 2 (two) times daily. 30 mL 0   No current facility-administered medications for this visit.       Objective:    BP 138/70 mmHg  Pulse 110  Temp(Src) 97.9 F (36.6 C) (Oral)  Resp 18  Ht 5\' 7"  (1.702 m)  Wt 246 lb 9.6 oz (111.857 kg)  BMI 38.61 kg/m2  SpO2 96%   Physical Exam  Constitutional: She is oriented to person, place, and time. She appears well-developed and well-nourished. No distress.  HENT:  Head: Normocephalic and atraumatic.  Right Ear: External ear normal.  Left Ear: External ear normal.  Nose: Mucosal edema and rhinorrhea present. Right sinus exhibits frontal sinus tenderness. Right sinus exhibits no maxillary sinus tenderness. Left sinus exhibits frontal sinus tenderness. Left sinus exhibits no maxillary sinus tenderness.  Mouth/Throat: Uvula is midline and mucous membranes are normal. Posterior oropharyngeal erythema present.  Postnasal drainage   Eyes: Conjunctivae and EOM are normal. Pupils are equal, round, and reactive to light.  Neck: Normal range of motion. Neck supple.  Cardiovascular: Normal rate, regular rhythm and normal heart sounds.  Exam reveals no gallop and no friction rub.   No murmur heard. Pulmonary/Chest: Effort normal and breath sounds normal. No respiratory distress. She has no wheezes. She has no rales.  Musculoskeletal: Normal range of motion.  Lymphadenopathy:    She has cervical adenopathy.  Neurological: She is alert and oriented to person, place, and time.  Skin: Skin is warm and dry. No rash noted. She is not diaphoretic.  Psychiatric: She has a normal mood and affect. Her behavior is normal.  Nursing note and  vitals reviewed.    Results for orders placed or performed in visit on 01/23/14  HM COLONOSCOPY  Result Value Ref Range   HM Colonoscopy      A polyp was found in the ascending colon.  It was diminutive in size.  It was removed.  The exam was otherwise without abnormality.         Assessment & Plan:   1. Acute maxillary sinusitis, recurrence not specified       1. Acute maxillary sinusitis: New. Rx for Doxycycline, Atrovent nasal spray, Robitussin with codeine provided.  Continue Mucinex bid.  RTC for acute worsening.    Meds ordered this encounter  Medications  . ipratropium (ATROVENT) 0.03 % nasal spray    Sig: Place 2 sprays  into the nose 2 (two) times daily.    Dispense:  30 mL    Refill:  0  . doxycycline (VIBRAMYCIN) 100 MG capsule    Sig: Take 1 capsule (100 mg total) by mouth 2 (two) times daily.    Dispense:  20 capsule    Refill:  0  . guaiFENesin-codeine 100-10 MG/5ML syrup    Sig: Take 5 mLs by mouth every 4 (four) hours as needed for cough.    Dispense:  180 mL    Refill:  0    No Follow-up on file.    I personally performed the services described in this documentation, which was scribed in my presence. The recorded information has been reviewed and considered.   Reginia Forts, M.D.  [facility] [address] [place], [place]  [address] [phone] phone [phone] fax

## 2014-03-14 ENCOUNTER — Ambulatory Visit: Payer: Self-pay | Admitting: Family Medicine

## 2014-03-14 DIAGNOSIS — Z1231 Encounter for screening mammogram for malignant neoplasm of breast: Secondary | ICD-10-CM | POA: Diagnosis not present

## 2014-03-20 ENCOUNTER — Telehealth: Payer: Self-pay

## 2014-03-20 NOTE — Telephone Encounter (Signed)
LMVM reminding patient to have her flu shot.

## 2014-04-04 ENCOUNTER — Other Ambulatory Visit: Payer: Self-pay | Admitting: Family Medicine

## 2014-04-10 DIAGNOSIS — H3531 Nonexudative age-related macular degeneration: Secondary | ICD-10-CM | POA: Diagnosis not present

## 2014-04-10 DIAGNOSIS — H3532 Exudative age-related macular degeneration: Secondary | ICD-10-CM | POA: Diagnosis not present

## 2014-04-14 ENCOUNTER — Encounter: Payer: Self-pay | Admitting: *Deleted

## 2014-04-14 DIAGNOSIS — Z1231 Encounter for screening mammogram for malignant neoplasm of breast: Secondary | ICD-10-CM

## 2014-04-26 ENCOUNTER — Ambulatory Visit (INDEPENDENT_AMBULATORY_CARE_PROVIDER_SITE_OTHER): Payer: Medicare Other | Admitting: Family Medicine

## 2014-04-26 ENCOUNTER — Encounter: Payer: Self-pay | Admitting: Family Medicine

## 2014-04-26 VITALS — BP 138/75 | HR 70 | Temp 98.0°F | Resp 16 | Ht 67.0 in | Wt 251.0 lb

## 2014-04-26 DIAGNOSIS — Z23 Encounter for immunization: Secondary | ICD-10-CM

## 2014-04-26 DIAGNOSIS — E875 Hyperkalemia: Secondary | ICD-10-CM | POA: Diagnosis not present

## 2014-04-26 DIAGNOSIS — I10 Essential (primary) hypertension: Secondary | ICD-10-CM

## 2014-04-26 DIAGNOSIS — R635 Abnormal weight gain: Secondary | ICD-10-CM

## 2014-04-26 DIAGNOSIS — E119 Type 2 diabetes mellitus without complications: Secondary | ICD-10-CM | POA: Diagnosis not present

## 2014-04-26 DIAGNOSIS — F32A Depression, unspecified: Secondary | ICD-10-CM

## 2014-04-26 DIAGNOSIS — F329 Major depressive disorder, single episode, unspecified: Secondary | ICD-10-CM

## 2014-04-26 DIAGNOSIS — E785 Hyperlipidemia, unspecified: Secondary | ICD-10-CM | POA: Diagnosis not present

## 2014-04-26 DIAGNOSIS — N952 Postmenopausal atrophic vaginitis: Secondary | ICD-10-CM | POA: Diagnosis not present

## 2014-04-26 LAB — CBC WITH DIFFERENTIAL/PLATELET
BASOS ABS: 0.1 10*3/uL (ref 0.0–0.1)
BASOS PCT: 1 % (ref 0–1)
EOS ABS: 0.2 10*3/uL (ref 0.0–0.7)
EOS PCT: 3 % (ref 0–5)
HCT: 38.9 % (ref 36.0–46.0)
Hemoglobin: 13 g/dL (ref 12.0–15.0)
Lymphocytes Relative: 29 % (ref 12–46)
Lymphs Abs: 1.7 10*3/uL (ref 0.7–4.0)
MCH: 30 pg (ref 26.0–34.0)
MCHC: 33.4 g/dL (ref 30.0–36.0)
MCV: 89.6 fL (ref 78.0–100.0)
MONO ABS: 0.6 10*3/uL (ref 0.1–1.0)
MPV: 8.6 fL (ref 8.6–12.4)
Monocytes Relative: 11 % (ref 3–12)
Neutro Abs: 3.3 10*3/uL (ref 1.7–7.7)
Neutrophils Relative %: 56 % (ref 43–77)
PLATELETS: 348 10*3/uL (ref 150–400)
RBC: 4.34 MIL/uL (ref 3.87–5.11)
RDW: 13.8 % (ref 11.5–15.5)
WBC: 5.9 10*3/uL (ref 4.0–10.5)

## 2014-04-26 LAB — COMPREHENSIVE METABOLIC PANEL
ALBUMIN: 4.3 g/dL (ref 3.5–5.2)
ALT: 12 U/L (ref 0–35)
AST: 18 U/L (ref 0–37)
Alkaline Phosphatase: 61 U/L (ref 39–117)
BUN: 27 mg/dL — ABNORMAL HIGH (ref 6–23)
CALCIUM: 9.6 mg/dL (ref 8.4–10.5)
CO2: 27 mEq/L (ref 19–32)
CREATININE: 1.33 mg/dL — AB (ref 0.50–1.10)
Chloride: 106 mEq/L (ref 96–112)
GLUCOSE: 103 mg/dL — AB (ref 70–99)
Potassium: 5.9 mEq/L — ABNORMAL HIGH (ref 3.5–5.3)
Sodium: 140 mEq/L (ref 135–145)
Total Bilirubin: 0.4 mg/dL (ref 0.2–1.2)
Total Protein: 7 g/dL (ref 6.0–8.3)

## 2014-04-26 LAB — LIPID PANEL
CHOL/HDL RATIO: 3.6 ratio
Cholesterol: 186 mg/dL (ref 0–200)
HDL: 51 mg/dL (ref >39)
LDL CALC: 113 mg/dL — AB (ref 0–99)
Triglycerides: 108 mg/dL (ref <150)
VLDL: 22 mg/dL (ref 0–40)

## 2014-04-26 LAB — HEMOGLOBIN A1C
Hgb A1c MFr Bld: 6.3 % — ABNORMAL HIGH (ref <5.7)
MEAN PLASMA GLUCOSE: 134 mg/dL — AB (ref <117)

## 2014-04-26 NOTE — Patient Instructions (Signed)
1. CALL INSURANCE TO SEE IF THEY COVER WEIGHT LOSS SURGERY. 2.  ATTEND A SEMINAR AT [employer]. 3.  RECOMMEND GOING BACK TO GYM TO STRENGTHEN CORE MUSCLES FOR FALL PREVENTION.  Hypertension Hypertension, commonly called high blood pressure, is when the force of blood pumping through your arteries is too strong. Your arteries are the blood vessels that carry blood from your heart throughout your body. A blood pressure reading consists of a higher number over a lower number, such as 110/72. The higher number (systolic) is the pressure inside your arteries when your heart pumps. The lower number (diastolic) is the pressure inside your arteries when your heart relaxes. Ideally you want your blood pressure below 120/80. Hypertension forces your heart to work harder to pump blood. Your arteries may become narrow or stiff. Having hypertension puts you at risk for heart disease, stroke, and other problems.  RISK FACTORS Some risk factors for high blood pressure are controllable. Others are not.  Risk factors you cannot control include:   Race. You may be at higher risk if you are African American.  Age. Risk increases with age.  Gender. Men are at higher risk than women before age 36 years. After age 18, women are at higher risk than men. Risk factors you can control include:  Not getting enough exercise or physical activity.  Being overweight.  Getting too much fat, sugar, calories, or salt in your diet.  Drinking too much alcohol. SIGNS AND SYMPTOMS Hypertension does not usually cause signs or symptoms. Extremely high blood pressure (hypertensive crisis) may cause headache, anxiety, shortness of breath, and nosebleed. DIAGNOSIS  To check if you have hypertension, your health care provider will measure your blood pressure while you are seated, with your arm held at the level of your heart. It should be measured at least twice using the same arm. Certain  conditions can cause a difference in blood pressure between your right and left arms. A blood pressure reading that is higher than normal on one occasion does not mean that you need treatment. If one blood pressure reading is high, ask your health care provider about having it checked again. TREATMENT  Treating high blood pressure includes making lifestyle changes and possibly taking medicine. Living a healthy lifestyle can help lower high blood pressure. You may need to change some of your habits. Lifestyle changes may include:  Following the DASH diet. This diet is high in fruits, vegetables, and whole grains. It is low in salt, red meat, and added sugars.  Getting at least 2 hours of brisk physical activity every week.  Losing weight if necessary.  Not smoking.  Limiting alcoholic beverages.  Learning ways to reduce stress. If lifestyle changes are not enough to get your blood pressure under control, your health care provider may prescribe medicine. You may need to take more than one. Work closely with your health care provider to understand the risks and benefits. HOME CARE INSTRUCTIONS  Have your blood pressure rechecked as directed by your health care provider.   Take medicines only as directed by your health care provider. Follow the directions carefully. Blood pressure medicines must be taken as prescribed. The medicine does not work as well when you skip doses. Skipping doses also puts you at risk for problems.   Do not smoke.   Monitor your blood pressure at home as directed by your health care provider. SEEK MEDICAL CARE IF:   You think you are having a  reaction to medicines taken.  You have recurrent headaches or feel dizzy.  You have swelling in your ankles.  You have trouble with your vision. SEEK IMMEDIATE MEDICAL CARE IF:  You develop a severe headache or confusion.  You have unusual weakness, numbness, or feel faint.  You have severe chest or abdominal  pain.  You vomit repeatedly.  You have trouble breathing. MAKE SURE YOU:   Understand these instructions.  Will watch your condition.  Will get help right away if you are not doing well or get worse. Document Released: 03/24/2005 Document Revised: 08/08/2013 Document Reviewed: 01/14/2013 [facility] Patient Information 2015 [place], [place]. This information is not intended to replace advice given to you by your health care provider. Make sure you discuss any questions you have with your health care provider.

## 2014-04-26 NOTE — Progress Notes (Signed)
Subjective:    Patient ID: Gail Burns, female    DOB: [birth year], 69 y.o.   MRN: [id number]  HPI  This is a 69 year old female with PMH CAD, HLD, HTN, and depression who is presenting for 6 month follow up.  CAD/HLD: on daily aspirin, crestor and fenofibrate. No CP, SOB, LE edema or palpitations. Follows up every 6-12 months with Dr. Nehemiah Massed; no changes to management at last visit with cardiology.    HTN: stable on lopressor. No changes to management made at last visit.  Patient reports good compliance with medication, good tolerance to medication, and good symptom control.  Denies HA/dizziness/focal weakness/paresthesias.  Depression: on lexapro, mood is stable except when it comes to her weight. Her inability to lose weight depresses her. She states "I'm an eater, I love sweets and bread". She has not been to the gym in 3 months. She states "I use every excuse in the book not to go". She states she is perfectly content to read a book and do jigsaw puzzles in her chair at home. She does not like to go to the gym. She does like to walk and dance. She has a treadmill at home that she does not use. She feels since she has stopped exercising her balance is worsening. She feels more unsteady when she gets out of chairs. She has gained 20 pounds in the past 1 year. She has gained 6 pounds since visit 6 months ago. She is wondering about weight loss supplements/gastric bypass surgery.  She continues to struggle with marital strain; husband is not very affectionate.  He also does not appear remorseful regarding his previous infidelity.  Previous marital counseling that was beneficial; husband now refusing to undergo counseling. Pt does not have interest in personal counseling.  "I haven't done anything wrong".  Pt did not leave husband but has not forgiven him for previous infidelity; she thinks of it frequently.  Denies SI/HI.  Vaginal atrophy: was prescribed estradiol cream at last visit but has not  used it because she wasn't sure how. She reports the box with the cream in it did not have clear instructions.  Influenza vaccine today  Mammogram December - normal.  S/p repeat colonoscopy in 12/2013.  Glucose intolerance:  HgbA1c at 6.5 at last visit;has not been watching diet; has not been exercising.  Review of Systems  Constitutional: Positive for activity change. Negative for fever, chills, diaphoresis and fatigue.  HENT: Negative for congestion.   Eyes: Negative for visual disturbance.  Respiratory: Negative for cough and shortness of breath.   Cardiovascular: Negative for chest pain, palpitations and leg swelling.  Gastrointestinal: Negative for nausea, vomiting, abdominal pain, diarrhea and constipation.  Endocrine: Negative for cold intolerance, heat intolerance, polydipsia, polyphagia and polyuria.  Genitourinary: Negative for difficulty urinating.  Skin: Negative for rash.  Neurological: Negative for dizziness, tremors, seizures, syncope, facial asymmetry, speech difficulty, weakness, light-headedness, numbness and headaches.  Psychiatric/Behavioral: Positive for dysphoric mood. Negative for suicidal ideas, sleep disturbance and self-injury. The patient is nervous/anxious.    Patient Active Problem List   Diagnosis Date Noted  . Abnormal colonoscopy 01/23/2014  . Personal history of colonic polyps 12/30/2013  . Unspecified menopausal and postmenopausal disorder 10/27/2013  . Routine general medical examination at a health care facility 09/20/2012  . Pancreatitis, acute 09/20/2012  . Dyspnea 09/20/2012  . Pure hypercholesterolemia 03/16/2012  . Other abnormal glucose 03/16/2012  . Essential hypertension, benign 03/16/2012  . Coronary artery disease 03/16/2012  .  Depression 03/16/2012   Prior to Admission medications   Medication Sig Start Date End Date Taking? Authorizing Provider  aspirin 81 MG tablet Take 81 mg by mouth daily.   Yes Historical Provider, MD  calcium  carbonate (OS-CAL) 600 MG TABS Take 600 mg by mouth 2 (two) times daily with a meal.   Yes Historical Provider, MD  escitalopram (LEXAPRO) 20 MG tablet Take 1 tablet (20 mg total) by mouth daily. 04/25/13  Yes Wardell Honour, MD  esomeprazole (NEXIUM) 40 MG capsule Take 1 capsule (40 mg total) by mouth daily at 12 noon. 04/25/13  Yes Wardell Honour, MD  fenofibrate (TRICOR) 145 MG tablet Take 1 tablet (145 mg total) by mouth daily. 04/25/13  Yes Wardell Honour, MD  meloxicam (MOBIC) 15 MG tablet Take 1 tablet (15 mg total) by mouth daily. 04/25/13  Yes Wardell Honour, MD  metoprolol tartrate (LOPRESSOR) 25 MG tablet TAKE 1 TABLET TWICE A DAY 04/04/14  Yes Mancel Bale, PA-C  Misc Natural Products (LUTEIN VISION BLEND PO) Take by mouth daily.   Yes Historical Provider, MD  Multiple Vitamin (MULTIVITAMIN) tablet Take 1 tablet by mouth daily.   Yes Historical Provider, MD  rosuvastatin (CRESTOR) 40 MG tablet Take 1 tablet (40 mg total) by mouth daily. 04/25/13  Yes Wardell Honour, MD  estradiol (ESTRACE) 0.1 MG/GM vaginal cream Place 1 Applicatorful vaginally once a week. Patient not taking: Reported on 04/26/2014 10/26/13   Wardell Honour, MD   Allergies  Allergen Reactions  . Augmentin [Amoxicillin-Pot Clavulanate] Hives   Patient's social and family history were reviewed.     Objective:   Physical Exam  Constitutional: She is oriented to person, place, and time. She appears well-developed and well-nourished. No distress.  obese  HENT:  Head: Normocephalic and atraumatic.  Right Ear: Hearing, tympanic membrane, external ear and ear canal normal.  Left Ear: Hearing, tympanic membrane, external ear and ear canal normal.  Nose: Nose normal.  Mouth/Throat: Uvula is midline, oropharynx is clear and moist and mucous membranes are normal.  Eyes: Conjunctivae, EOM and lids are normal. Pupils are equal, round, and reactive to light. Right eye exhibits no discharge. Left eye exhibits no discharge. No  scleral icterus.  Neck: Normal range of motion. Neck supple. Carotid bruit is not present. No thyromegaly present.  Cardiovascular: Normal rate, regular rhythm, normal heart sounds, intact distal pulses and normal pulses.  Exam reveals no gallop and no friction rub.   No murmur heard. Pulmonary/Chest: Effort normal and breath sounds normal. No respiratory distress. She has no wheezes. She has no rhonchi. She has no rales.  Abdominal: Soft. Bowel sounds are normal. She exhibits no distension and no mass. There is no tenderness. There is no rebound and no guarding.  Musculoskeletal: Normal range of motion.  Lymphadenopathy:    She has no cervical adenopathy.  Neurological: She is alert and oriented to person, place, and time. No cranial nerve deficit.  Skin: Skin is warm, dry and intact. No lesion and no rash noted. She is not diaphoretic. No erythema. No pallor.  Psychiatric: She has a normal mood and affect. Her speech is normal and behavior is normal. Thought content normal.  Tearful.   BP 138/75 mmHg  Pulse 70  Temp(Src) 98 F (36.7 C)  Resp 16  Ht 5\' 7"  (1.702 m)  Wt 251 lb (113.853 kg)  BMI 39.30 kg/m2  SpO2 95%     Assessment & Plan:  1. Essential  hypertension, benign Stable. Continue on lopressor. - CBC with Differential - Comprehensive metabolic panel  2. Type 2 diabetes mellitus without complication 3. Weight gain Last A1C 6.5. No medications started at this time. We discussed healthy eating and weight loss at length. Goal of 1 pound weight loss per month or 5 pounds by next visit. We discussed using treadmill at home even for just minutes per day or joining a dance class. We discussed not buying sweets so she is not tempted to eat them.  Discussed gastric bypass surgery options. - CBC with Differential - Comprehensive metabolic panel - Hemoglobin A1c  3. Dyslipidemia Stable. Last lipid normal. Continue on crestor and fenofibrate. - Comprehensive metabolic panel -  Lipid panel  4. Need for immunization against influenza - Flu Vaccine QUAD 36+ mos IM (Fluarix)  5. Atrophic vaginitis Pt has estradiol cream at home. Discussed instructions for use. She will use nightly for 2 weeks, then 2-3 nights a week. If able, she will decrease to once a week.   6. Depression Mood would improve if she lost weight, see above. Continue on lexapro at current dose.  Counseled extensively about marital issues and possible counseling; pt to consider counseling.   Gail Burns Gail Burns, M.D. [facility] [address] [place],   [address] [phone] phone [phone] fax   Benjaman Pott. Drenda Freeze, MHS [facility]  04/26/2014

## 2014-04-26 NOTE — Progress Notes (Signed)
Garcinia [place] for belly fat.  [facility] are promoting it.  Facebook and Internet.

## 2014-04-28 NOTE — Addendum Note (Signed)
Addended by: Wardell Honour on: 04/28/2014 11:49 AM   Modules accepted: Orders

## 2014-04-30 NOTE — Addendum Note (Signed)
Addended by: Constance Goltz on: 04/30/2014 01:47 PM   Modules accepted: Orders

## 2014-05-08 ENCOUNTER — Encounter: Payer: Self-pay | Admitting: Family Medicine

## 2014-05-16 ENCOUNTER — Telehealth: Payer: Self-pay

## 2014-05-16 NOTE — Telephone Encounter (Signed)
A future order is placed.

## 2014-05-16 NOTE — Telephone Encounter (Signed)
Pt states Dr.Smith wanted her to have blood work done and she need [place] to call the [employer] and give the OKAY. Please call [phone]

## 2014-05-18 NOTE — Telephone Encounter (Signed)
Spoke with pt and she states [facility] did receive the order. She will be going to get her blood drawn today.

## 2014-05-19 DIAGNOSIS — E875 Hyperkalemia: Secondary | ICD-10-CM | POA: Diagnosis not present

## 2014-06-13 ENCOUNTER — Telehealth: Payer: Self-pay

## 2014-06-13 NOTE — Telephone Encounter (Signed)
Received pt's labs from [facility]. Per Dr. Tamala Julian, call pt and let her know that her potassium level remains slightly elevated. Is she taking and potassium supplement? Is she taking Lisinopril? (Our records have that she is NOT taking Lisinopril)

## 2014-06-13 NOTE — Telephone Encounter (Signed)
Spoke with patient she was on Lisinopril and was taken off now she takes Metoprolol she also order a energy drink off the Internet called ACT it has a lot of potassium in it

## 2014-06-13 NOTE — Telephone Encounter (Signed)
Call --- recommend patient stopping energy drink that contains a lot of potassium in it.

## 2014-06-14 NOTE — Telephone Encounter (Signed)
Pt notified and copy of labs mailed to pt.

## 2014-07-03 ENCOUNTER — Encounter: Payer: Self-pay | Admitting: Family Medicine

## 2014-07-06 ENCOUNTER — Other Ambulatory Visit: Payer: Self-pay | Admitting: Family Medicine

## 2014-07-10 DIAGNOSIS — H3531 Nonexudative age-related macular degeneration: Secondary | ICD-10-CM | POA: Diagnosis not present

## 2014-07-10 DIAGNOSIS — H3532 Exudative age-related macular degeneration: Secondary | ICD-10-CM | POA: Diagnosis not present

## 2014-07-10 DIAGNOSIS — Z006 Encounter for examination for normal comparison and control in clinical research program: Secondary | ICD-10-CM | POA: Diagnosis not present

## 2014-07-11 DIAGNOSIS — I10 Essential (primary) hypertension: Secondary | ICD-10-CM | POA: Diagnosis not present

## 2014-07-11 DIAGNOSIS — I2510 Atherosclerotic heart disease of native coronary artery without angina pectoris: Secondary | ICD-10-CM | POA: Diagnosis not present

## 2014-07-11 DIAGNOSIS — R0602 Shortness of breath: Secondary | ICD-10-CM | POA: Diagnosis not present

## 2014-07-11 DIAGNOSIS — E782 Mixed hyperlipidemia: Secondary | ICD-10-CM | POA: Diagnosis not present

## 2014-07-25 NOTE — Consult Note (Signed)
Chief Complaint:   Subjective/Chief Complaint Overall better with less abdominal pain.   VITAL SIGNS/ANCILLARY NOTES: **Vital Signs.:   31-Dec-13 15:20   Vital Signs Type Routine   Temperature Temperature (F) 97.9   Celsius 36.6   Temperature Source Oral   Pulse Pulse 62   Respirations Respirations 18   Systolic BP Systolic BP 093   Diastolic BP (mmHg) Diastolic BP (mmHg) 71   Mean BP 93   Pulse Ox % Pulse Ox % 96   Pulse Ox Activity Level  At rest   Oxygen Delivery Room Air/ 21 %   Brief Assessment:   Additional Physical Exam Abdomen is soft and benign. Non tender.   Lab Results: Hepatic:  31-Dec-13 06:50    Bilirubin, Total 0.5   Alkaline Phosphatase 78   SGPT (ALT)  124   SGOT (AST)  105   Total Protein, Serum  6.2   Albumin, Serum  3.3  Routine Chem:  31-Dec-13 06:50    Cholesterol, Serum 157   Triglycerides, Serum 192   HDL (INHOUSE) 44   VLDL Cholesterol Calculated 38   LDL Cholesterol Calculated 75 (Result(s) reported on 06 Apr 2012 at 07:32AM.)   Glucose, Serum 93   BUN  32   Creatinine (comp) 1.19   Sodium, Serum 144   Potassium, Serum 4.6   Chloride, Serum  111   CO2, Serum 28   Calcium (Total), Serum 8.7   Osmolality (calc) 293   eGFR (African American)  54   eGFR (Non-African American)  47 (eGFR values <34mL/min/1.73 m2 may be an indication of chronic kidney disease (CKD). Calculated eGFR is useful in patients with stable renal function. The eGFR calculation will not be reliable in acutely ill patients when serum creatinine is changing rapidly. It is not useful in  patients on dialysis. The eGFR calculation may not be applicable to patients at the low and high extremes of body sizes, pregnant women, and vegetarians.)   Anion Gap  5  Cardiac:  31-Dec-13 06:50    Troponin I < 0.02 (0.00-0.05 0.05 ng/mL or less: NEGATIVE  Repeat testing in 3-6 hrs  if clinically indicated. >0.05 ng/mL: POTENTIAL  MYOCARDIAL INJURY. Repeat  testing in 3-6 hrs  if  clinically indicated. NOTE: An increase or decrease  of 30% or more on serial  testing suggests a  clinically important change)   CPK-MB, Serum  < 0.5 (Result(s) reported on 06 Apr 2012 at 07:40AM.)  Routine Hem:  31-Dec-13 06:50    WBC (CBC) 7.0   RBC (CBC) 3.85   Hemoglobin (CBC)  11.4   Hematocrit (CBC)  34.4   Platelet Count (CBC) 297   MCV 89   MCH 29.6   MCHC 33.1   RDW 13.5   Neutrophil % 60.9   Lymphocyte % 27.1   Monocyte % 10.1   Eosinophil % 1.5   Basophil % 0.4   Neutrophil # 4.2   Lymphocyte # 1.9   Monocyte # 0.7   Eosinophil # 0.1   Basophil # 0.0 (Result(s) reported on 06 Apr 2012 at 07:18AM.)   Assessment/Plan:  Assessment/Plan:   Assessment Acute pancreatitis, clinically improving. Probable gallstone related although no evidence of retained CBD stones. LFt's improving. Dilated pancreatic duct.    Plan Clear liquids. Will consider MRCP in next 48 hours or so.  Surgical consultation during this admission for possible cholecystectomy. Will follow.   Electronic Signatures: Jill Side (MD)  (Signed 31-Dec-13 17:49)  Authored: Chief Complaint, VITAL SIGNS/ANCILLARY  NOTES, Brief Assessment, Lab Results, Assessment/Plan   Last Updated: 31-Dec-13 17:49 by Jill Side (MD)

## 2014-07-25 NOTE — Consult Note (Signed)
Brief Consult Note: Diagnosis: Pancreatitis.   Patient was seen by consultant.   Comments: Acute pancreatitis, ? gallstone related.  Recommendations: IV fluids. NPO except ice chips. Repeat labs in am. Further recommendations to follow.  Electronic Signatures: Jill Side (MD)  (Signed 30-Dec-13 18:26)  Authored: Brief Consult Note   Last Updated: 30-Dec-13 18:26 by Jill Side (MD)

## 2014-07-28 NOTE — Consult Note (Signed)
PATIENT NAME:  Gail Burns, Gail Burns MR#:  [id number] DATE OF BIRTH:  1945/04/09  DATE OF CONSULTATION:  04/07/2012  CONSULTING PHYSICIAN:  Vestal Crandall A. Marina Gravel, MD  IMPRESSION:  This is a 69 year old obese white female with a history of hypertension, coronary artery disease and recurrent biliary pancreatitis with question of persistent choledocholithiasis.  RECOMMENDATIONS:  The patient will proceed with an MRCP tomorrow. She is continuing to improve and if the MRCP is negative then would recommend outpatient laparoscopic cholecystectomy with cholangiography. If she continues to have signs of possible common bile duct stone or MRCP demonstrates a stone, she will require an ERCP. I will follow along with you.   HISTORY:  A 69 year old white female with a history of coronary artery disease, hypertension and obesity who presents now to the hospital with her second episode of epigastric abdominal pain radiating directly to her back and biochemical evidence of pancreatitis. Her first episode of this was in [place] approximately 3 years ago when she was visiting a friend. She developed the sudden onset of epigastric and back pain followed by some mild nausea and was told she had pancreatitis. She was admitted briefly at that outside hospital and no ultrasound was obtained. The patient was diagnosed with pancreatitis and sent home. In between, the patient has had no evidence of postprandial abdominal pain or fatty food intolerance. On Monday, the patient had the sudden onset again of epigastric abdominal pain radiating into her back associated with some mild nausea. Again, she was brought to the Emergency Room and diagnosed with pancreatitis. Gastroenterology has become involved. On admission, her lipase was greater than 3000, AST 342, ALT 170, total bilirubin 0.5. Troponins have been negative. White count was elevated at 14.5 and now 5.8. Total calcium was 9.6 on admission. Creatinine was 1.39 and now 0.93. She is also  complaining of some mild loose stool. Of note several years ago, the patient had some throat tightness which ended up resulting in a cardiac catheterization and angioplasty with 80% stenosis. The patient currently is feeling better. She has had no fevers, no chills and no jaundice.   ALLERGIES:  AUGMENTIN.   HOME MEDICATIONS:  Aspirin, calcium carbonate, chlorthalidone, Crestor, Lexapro, lisinopril, meloxicam, multivitamin, Nexium, Ocuvite oral capsule and TriCor.   PAST SURGICAL HISTORY:  None.   PAST MEDICAL HISTORY:  Hypertension, obesity, coronary artery disease, cholelithiasis and pancreatitis.   SOCIAL HISTORY:  She is a rehabilitated cigarette smoker. Does not drink. Married,  retired Oceanographer.   REVIEW OF SYSTEMS:  As described above and as per 10-point review; otherwise unremarkable.   PHYSICAL EXAMINATION: GENERAL: This is alert and oriented white female in no obvious distress. Husband at bedside.  VITAL SIGNS: Temperature 98.3, pulse 62, respiratory rate 18, blood pressure 133/68.  HEENT: There is no scleral icterus.  HEAD AND NECK: Unremarkable. Facies are symmetrical.  LUNGS: Clear bilaterally.  HEART: Regular rate and rhythm.  ABDOMEN: Soft, obese, mildly tender to deep palpation in the epigastrium with no Murphy sign. There are no scars. No hernias. No masses. No signs of peritonitis.  SKIN: Normal.  RECTAL/GENITOURINARY: Examination was deferred.  EXTREMITIES: Warm and well perfused.  MUSCULOSKELETAL: Grossly normal.  NEUROLOGIC: Alert and oriented.  PSYCHIATRY: Appropriate mood, judgment and affect.   DIAGNOSTIC DATA:  Laboratory values are as described above. Review of x-rays: X-rays were personally reviewed on the PACS monitor. An ultrasound of the right upper quadrant dated December 30th demonstrated cholelithiasis. Common bile duct was 5.9 mm, appropriate for age. Pancreatic  duct was 5.4 mm. Gallbladder wall thickness was 2 mm. No pericholecystic fluid, no  ascites was noted.   IMPRESSION AND PLAN:  As described above.    ____________________________ Jeannette How. Marina Gravel, MD mab:si D: 04/07/2012 17:05:40 ET T: 04/07/2012 18:17:57 ET JOB#: [id number]  cc: Elta Guadeloupe A. Marina Gravel, MD, <Dictator> Renette Butters. Tamala Julian, MD Welma Mccombs Bettina Gavia MD ELECTRONICALLY SIGNED 04/12/2012 7:39

## 2014-07-28 NOTE — Consult Note (Signed)
Chief Complaint:   Subjective/Chief Complaint Feels better with minimal nausea.   VITAL SIGNS/ANCILLARY NOTES: **Vital Signs.:   01-Jan-14 11:43   Vital Signs Type Q 4hr   Temperature Temperature (F) 97.8   Celsius 36.5   Temperature Source Oral   Pulse Pulse 60   Respirations Respirations 18   Systolic BP Systolic BP 765   Diastolic BP (mmHg) Diastolic BP (mmHg) 71   Mean BP 89   Pulse Ox % Pulse Ox % 94   Pulse Ox Activity Level  At rest   Oxygen Delivery Room Air/ 21 %   Brief Assessment:   Additional Physical Exam Abdomen is soft and non tender.   Lab Results: Routine Chem:  01-Jan-14 05:07    Glucose, Serum 85   BUN 17   Creatinine (comp) 0.93   Sodium, Serum 143   Potassium, Serum  3.3   Chloride, Serum  111   CO2, Serum 24   Calcium (Total), Serum  8.4   Anion Gap 8   Osmolality (calc) 286   eGFR (African American) >60   eGFR (Non-African American) >60 (eGFR values <19m/min/1.73 m2 may be an indication of chronic kidney disease (CKD). Calculated eGFR is useful in patients with stable renal function. The eGFR calculation will not be reliable in acutely ill patients when serum creatinine is changing rapidly. It is not useful in  patients on dialysis. The eGFR calculation may not be applicable to patients at the low and high extremes of body sizes, pregnant women, and vegetarians.)  Routine Hem:  01-Jan-14 05:07    WBC (CBC) 5.8   RBC (CBC)  3.62   Hemoglobin (CBC)  10.9   Hematocrit (CBC)  32.1   Platelet Count (CBC) 267   MCV 89   MCH 30.1   MCHC 33.9   RDW 13.1   Neutrophil % 57.2   Lymphocyte % 28.2   Monocyte % 11.2   Eosinophil % 2.8   Basophil % 0.6   Neutrophil # 3.3   Lymphocyte # 1.6   Monocyte # 0.7   Eosinophil # 0.2   Basophil # 0.0 (Result(s) reported on 07 Apr 2012 at 06:04AM.)   Assessment/Plan:  Assessment/Plan:   Assessment Acute pancreatitis, clinically resolving. Gallstones.    Plan Agree with advancing diet. MRCP  tomorrow. Surgical consultation for cholecystectomy later. Discussed with her. Will follow.   Electronic Signatures: IJill Side(MD)  (Signed 01-Jan-14 13:04)  Authored: Chief Complaint, VITAL SIGNS/ANCILLARY NOTES, Brief Assessment, Lab Results, Assessment/Plan   Last Updated: 01-Jan-14 13:04 by IJill Side(MD)

## 2014-07-28 NOTE — Op Note (Signed)
PATIENT NAME:  Gail Burns, Gail Burns MR#:  [id number] DATE OF BIRTH:  20-Jan-1946  DATE OF PROCEDURE:  04/23/2012  PREOPERATIVE DIAGNOSIS: Recurrent biliary pancreatitis and cholelithiasis.   POSTOPERATIVE DIAGNOSIS: Recurrent biliary pancreatitis and cholelithiasis.   PROCEDURE PERFORMED: Laparoscopic cholecystectomy with intraoperative cholangiography.   SURGEON: Blayden Conwell A. Marina Gravel M.D.   ASSISTANT: PA student.  TYPE OF ANESTHESIA: General oral endotracheal.   FINDINGS: Normal-appearing cholangiogram, cholelithiasis.   SPECIMENS: As described above.   ESTIMATED BLOOD LOSS: 25 mL.   DRAINS: None.   LAP AND NEEDLE COUNT: Correct x 2.   DESCRIPTION OF PROCEDURE: With the patient in the supine position, general oral endotracheal anesthesia was induced without difficulty. The patient's abdomen was widely prepped and draped with ChloraPrep solution. Timeout was observed.   A 12 mm blunt Hasson trocar was placed through an infraumbilical transversely oriented skin incision, with stay sutures being passed through the fascia, and pneumoperitoneum was established. The patient did have some reflux bradycardia, which was treated by anesthesia with Robinul and atropine. With this resolved, pressure was reduced to 13 mmHg and the procedure was continued. With pneumoperitoneum re-established, the patient was then positioned in reverse Trendelenburg and airplane right side up. The remaining 5 mm bladeless trocars were placed, 1 in the epigastric region, 2 in the right subcostal margin. The gallbladder was identified in the right upper quadrant, elevated towards the right shoulder with grasping at its fundus and lateral traction upon Hartmann's pouch. The hepatoduodenal ligament was then incised utilizing blunt technique. The cystic node was identified. The cystic artery was critically identified, doubly clipped on the portal side, singly clipped on the gallbladder side and divided. A critical view of safety  cholecystectomy was ensured. A Kumar cholangiogram catheter clamp was then placed across the base of the gallbladder. Hartmann's pouch was cannulated with the needle tip catheter tubing. Fluoroscopy was then performed under real-time. There was some leakage around the catheter; however, I was able to opacify the intrahepatic biliary radicals as well as visualize into the duodenum. No evidence of retained stones or filling defect.   With the catheter then removed, the cystic duct was doubly clipped on the portal side, singly clipped on the gallbladder side and divided. No further accessory or aberrant bile duct or arterial structure was identified. The gallbladder was then retrieved off the gallbladder fossa utilizing hook cautery apparatus, with minimal spillage of clear bile, and captured in an Endo Catch device. Hemostasis being ensured on the operative field, the Endo Catch device was retrieved by switching the 10 mm scope to the 5 mm scope. In the process of doing so, the umbilical port site was inspected and no evidence of bile injury. With pneumoperitoneum re-established, the right upper quadrant was then irrigated with a total of 1 liter of normal saline and aspirated dry. The ports were then removed under direct visualization after hemostasis was ensured on the operative field. A total of 30 mL of 0.25% plain Marcaine was infiltrated along all skin and fascial incisions prior to closure. The infraumbilical fascial defect was closed with multiple figure-of-eight simple vertically oriented #0 Vicryl sutures, the existing stay sutures tied to each other. Hemostasis was obtained in subcutaneous tissues with electrocautery and subcutaneous tissues were reapproximated utilizing a 4-0 subcuticular stitch. Benzoin, Steri-Strips, Telfa and Tegaderm were then applied, and the patient was then subsequently extubated and taken to the recovery room in stable and satisfactory condition by anesthesia services.    ____________________________ Jeannette How Marina Gravel, MD mab:jm D: 04/23/2012 12:25:58  ET T: 04/23/2012 14:34:24 ET JOB#: [id number]  cc: Elta Guadeloupe A. Marina Gravel, MD, <Dictator> Carneshia Raker A Rosell Khouri MD ELECTRONICALLY SIGNED 04/24/2012 8:40

## 2014-07-28 NOTE — Consult Note (Signed)
PATIENT NAME:  Gail Burns, Gail Burns MR#:  [id number] DATE OF BIRTH:  02/05/1946  DATE OF CONSULTATION:  04/07/2012  REFERRING PHYSICIAN: Primary doctor. CONSULTING PHYSICIAN:  Jill Side, MD  REASON FOR CONSULTATION: Acute pancreatitis.   HISTORY OF PRESENT ILLNESS: A 69 year old female with history of hypertension, hypertriglyceridemia, arthritis and depression. The patient presented to the Emergency Room with acute onset abdominal pain. Serum lipase was elevated to more than 3000. The patient was admitted with a diagnosis of acute pancreatitis.   PAST MEDICAL HISTORY: Significant for history of arthritis, hypertriglyceridemia, and hypertension.   ALLERGIES: PENICILLIN.   HOME MEDICATIONS: Lexapro and Lopressor.   SOCIAL HISTORY: She denies alcohol or smoking.   FAMILY HISTORY: Unremarkable.   REVIEW OF SYSTEMS: Negative except for what is mentioned in the history of present illness.   PHYSICAL EXAMINATION:  GENERAL: Obese female, does not appear to be in any acute distress, quite awake and alert.  HEENT: Appears to be fairly unremarkable.  VITAL SIGNS: Temperature 97.8, heart rate is in 50s and 60s, respirations 18 to 20, blood pressure about 125/71.  HEENT: Unremarkable. No jaundice or anemia was noted.  LUNGS: Clear to auscultation bilaterally with fair air entry. No added sounds.  CARDIOVASCULAR: Regular rate and rhythm. No gallops or murmur.  ABDOMEN: Examination initially showed some epigastric tenderness. Currently, abdominal examination is fairly benign with fair bowel sounds and no significant tenderness or rebound. No hepatosplenomegaly or ascites were noted.  EXTREMITIES: No edema.  NEUROLOGIC: Appears to be unremarkable.   LABORATORIES: White cell count on admission was 14,007, hemoglobin 11.4 was 12.7 on admission. ALT is 124 and AST is 105. Serum albumin is low at 3.3. Serum lipase as mentioned above was more than 3000. Total bilirubin has been normal. ALT and AST are  down to 124 and 105 respectively now. Ultrasound showed cholelithiasis without evidence of acute cholecystitis.  CBD is not dilated. Pancreatic duct appears to be somewhat dilated.    ASSESSMENT AND PLAN: The patient with, what appears to be, acute pancreatitis. Her AST and ALT are slightly elevated. Alkaline phosphatase and total bilirubin were normal. The patient does have stones in the gallbladder and most likely this is an episode of gallstone-related pancreatitis. The patient's common bile duct appears to be normal and her triglycerides were normal. Retained common bile duct is unlikely, although common bile duct sludge or small stones remain a possibility. I agree with conservative management for acute pancreatitis. Advance diet gradually. Magnetic resonance cholangiopancreatography has been recommended, although the patient somewhat reluctant to have it done due to being claustrophobic. We will try to get it done tomorrow if possible. If M.R.C.P. is negative, the patient will require cholecystectomy after resolution of acute pancreatitis. If M.R.C.P. is positive, then an ERCP will be considered. Further recommendations to follow.    ____________________________ Jill Side, MD si:aw D: 04/07/2012 13:17:37 ET T: 04/07/2012 13:26:06 ET JOB#: [id number]  cc: Jill Side, MD, <Dictator> Jill Side MD ELECTRONICALLY SIGNED 04/14/2012 11:13

## 2014-07-28 NOTE — Discharge Summary (Signed)
PATIENT NAME:  Gail Burns, MEENAN MR#:  [id number] DATE OF BIRTH:  [birth year]  DATE OF ADMISSION:  04/05/2012 DATE OF DISCHARGE:  04/09/2012  PRIMARY CARE PHYSICIAN:  Dr. Reginia Forts, [facility], [address], [place], [place].   DISCHARGE DIAGNOSES:  Acute pancreatitis, gallstone, hypertension, hyperlipidemia.   HISTORY OF PRESENT ILLNESS:  The patient is a 69 year old female with a history of coronary artery disease status post angioplasty, 80% stenosis without stent placement, history of pancreatitis as well, presented with chest pressure.  Chest pressure was 10/10 and constant, dull ache pain associated with epigastric pain that also radiating to the back between her shoulder blades, and was unable to take in a deep breath due to the pain and developed suddenly this morning.  The pain decreased significantly with the pain medication which she received in the ER and she was telling that the pain felt similar to the pancreatic pain which she had 3 years ago.  Her lipase was more than 3000 in the Emergency Room and so she was admitted with a diagnosis of acute pancreatitis.    HOSPITAL COURSE AND STAY:  As usual, she was kept n.p.o., maintained with IV hydration and IV PPI.  GI consult was done because she had a history of pancreatitis and dilated pancreatic duct was found on USG abdomen, so GI consult was done for that.  Her pain came under control with this regimen of pain medication, IV fluid and n.p.o. slowly.  GI consult with Dr. Dionne Milo was done and he suggested to go for either MRCP or ERCP after initial improvement.  Also decided to call a surgical consult for possible cholecystectomy.  Surgery consult was done and they suggested for the gallstone to have the acute episode of pancreatitis resolve and then they can do the surgery so they advised her to follow in the clinic after a few days of discharge and then they can plan for outpatient surgery for gallstones.  Finally,  Dr. Dionne Milo from GI suggested to get MRCP and if MRCP is negative and she is tolerating diet, then she can go home.  She had a significant improvement in her symptoms for 3 to 4 days and the MRCP results are as follows.  pancreatic duct likely secondary to patient reportedly still pancreatitis, otherwise unremarkable MRCP without evidence of filling defect, suggestive of retained gallstone.  So she was discharged home with advice to follow up in the clinic and with her primary care physician to arrange about further plan and followup appointments with GI.   [place] STAY:  1.  Acute pancreatitis, as mentioned above.  2.  Hypertension.  We started her on home medications p.o. once she started on diet.  3.  Hyperlipidemia, restarted on statin.  4.  Coronary artery disease.  Troponins were negative.  She was continued on the same home medication which she was taking before.  5.  For gallstones, surgery consult was called in and they suggested to follow up in 1 week after discharge for outpatient surgery.   IMPORTANT LABORATORY RESULTS DURING THE HOSPITAL STAY:  On 04/06/2012 WBCs 70, RBC 3.85, hemoglobin 11.4, hematocrit 34.4, platelet count 297 and MCV 89.  BUN 32, creatinine 1.19, sodium 144, potassium 4.6, chloride 111, CO2 28. SGPT 124, SGOT 105, bilirubin 0.5,  triglycerides 192, HDL 44, VLDL 38 and LDL 75.   CONSULTANTS DURING THE HOSPITAL COURSE:  Dr. Dionne Milo for GI.   CONDITION ON DISCHARGE:  Stable.   CODE  STATUS:  FULL CODE.   MEDICATIONS ON DISCHARGE:  Nexium 40 mg delayed release capsule once a day, lisinopril 20 mg orally once a day, TriCor 145 mg oral tablet once a day, Crestor 40 mg oral tablet once a day, Lexapro 20 mg oral tablet once a day, chlorthalidone 25 mg oral tablet once a day, Meloxicam 50 mg oral tablet once a day, aspirin 81 mg oral tablet once a day, calcium carbonate 600 mg oral tablet once a day, multivitamin 1 tablet orally  once a day, acetaminophen/oxycodone 100/5 mg oral tablet every 6 hours as needed for pain.   HOME HEALTH:  No.   HOME OXYGEN:  No.   DIET:  Low sodium, low fat, low cholesterol, regular consistency diet.   SPECIAL INSTRUCTIONS:  Avoid spicy and oily foods for the next 1 to 2 weeks and is to follow up next week with surgery clinic for outpatient cholecystectomy.    TOTAL TIME SPENT IN DISCHARGE:  Forty-five minutes.      ____________________________ Ceasar Lund Anselm Jungling, MD vgv:ea D: 04/13/2012 22:22:00 ET T: 04/14/2012 01:59:23 ET JOB#: [id number]  cc: Ceasar Lund. Anselm Jungling, MD, <Dictator> Renette Butters. Tamala Julian, MD Jeannette How. Marina Gravel, MD  Vaughan Basta MD ELECTRONICALLY SIGNED 05/18/2012 17:17

## 2014-07-28 NOTE — Consult Note (Signed)
Chief Complaint:   Subjective/Chief Complaint Feels better. Awaiting MRCP. If MRCP negative and patient continues to tolerate dist, probable DC in am with OP follow up with me and general surgery in few weeks. If MRCP is abnormal Dr. Vira Agar will make further recommendations as I will be out of town for next few days. Discussed with Dr. Boyce Medici.   Electronic Signatures: Jill Side (MD)  (Signed 02-Jan-14 11:41)  Authored: Chief Complaint   Last Updated: 02-Jan-14 11:41 by Jill Side (MD)

## 2014-07-28 NOTE — H&P (Signed)
PATIENT NAME:  UNNAMED, HINO MR#:  [id number] DATE OF BIRTH:  April 16, 1945  DATE OF ADMISSION:  04/05/2012  CODE STATUS:  FULL CODE.  Surrogate decision maker is her husband, Kearah Gayden.    CHIEF COMPLAINT: Abdominal pain, nausea, vomiting, diarrhea, chest pressure.   HISTORY OF PRESENT ILLNESS: The patient is a 69 year old female with a history of coronary artery disease, status post angioplasty of 80% stenosis without stent placement, history of pancreatitis as well, presents with chest pressure. She notes that her chest pressure is 10 out of 10, constant dull ache, associated with epigastric pain that also radiates to the back between her shoulder blades. She notes that she had some difficulty taking in a deep breath. This developed suddenly this morning. She has found no alleviating factors at home; however, the pain has decreased significantly with pain medications that she has received in the ER. She notes that this pain feels similar to her pancreatitis episode 3 years ago. Upon evaluation in the Emergency Department, she was noted to have elevated lipase greater than 3000.   Regarding nausea, vomiting, diarrhea, she notes that she has had some yellow emesis, nonbloody, had multiple episodes.   PAST MEDICAL HISTORY: 1. Coronary artery disease, status post angioplasty in 2006, with 80% blockage. No stents were placed.  2. Hypertension.  3. Hyperlipidemia.  4. History of pancreatitis.  5. Macular degeneration.  6. Diverticulosis and internal hemorrhoids.   PAST SURGICAL HISTORY:  1. Colonoscopy that showed internal hemorrhoids and diverticulosis of the sigmoid and ascending colon.   2. Knee arthroscopy.   ALLERGIES: AUGMENTIN, WHICH CAUSES HIVES.   MEDICATIONS:  1. Aspirin 81 mg daily.  2. Calcium carbonate 600 mg daily.  3. Chlorthalidone 25 mg daily.  4. Crestor 40 mg daily.  5. Lexapro 20 mg daily.  6. Lisinopril 20 mg daily.  7. Meloxicam 15 mg daily.  8. Multivitamin 1 tablet  daily.  9. Nexium 40 mg daily.  10. Ocuvite Lutein daily.  11. TriCor 145 mg daily.   SOCIAL HISTORY: Remote tobacco use. She quit 21 years ago. Prior to that, she smoked averaging 1 to 3 packs per day for 35 years. Occasional alcohol use, admitting to 1 glass of wine per weekend. No illicits. She is retired. 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   FAMILY HISTORY: Notable for coronary artery disease. Father passed away at 26 with a massive MI. Brother had open-heart surgery at a young age, and mother had a pacemaker.   REVIEW OF SYSTEMS: CONSTITUTIONAL: Denies fever, fatigue.  EYES: Denies any change in vision, no double vision. She does have macular degeneration.  ENT: No tinnitus. No ear pain.  RESPIRATORY: No cough, wheezing, shortness of breath.  CARDIOVASCULAR: Admits to some chest pain today. No palpitations. Does have history of high blood pressure.  GASTROINTESTINAL: Admits to nausea, vomiting, diarrhea, abdominal pain. Denies hematemesis, melena, or rectal bleeding.  GENITOURINARY: Denies frequency, urgency or dysuria.  SKIN: No new skin rashes.  MUSCULOSKELETAL: Has intermittent knee pains, otherwise no new joint pains, joint swelling or muscle aches.  NEUROLOGICAL: Denies paresthesias, dysarthria, or tremors.  PSYCHIATRIC: Denies anxiety or depression.     PHYSICAL EXAMINATION: VITAL SIGNS: Temperature 97.9, heart rate 61 to 67, respirations 18, blood pressure on presentation 160/70, currently 115/58, sating 98% on room air.  GENERAL: Well-appearing Caucasian female in no apparent distress.  PSYCHIATRIC: Awake, alert, oriented x 3. Judgment and insight are intact.  EYES: Pupils are equal, round,  and reactive to light and accommodation. Anicteric sclerae. Normal eyelids.  ENT: Normal ears and nares. Posterior oropharynx is clear without erythema or exudate.  CARDIOVASCULAR: S1, S2, regular rate and rhythm. No murmurs appreciated. No  pretibial edema.  PULMONARY: Clear to auscultation bilaterally. No wheezes, rales, or rhonchi.  ABDOMEN: There is tenderness with palpation of the epigastrium. No right upper quadrant tenderness. Otherwise, abdomen is soft, nondistended, with normal bowel sounds. No hepatomegaly appreciated.  SKIN: Warm and dry. No rashes appreciated.  MUSCULOSKELETAL: Full range of motion all extremities with no clubbing or cyanosis noted.   LABORATORY, DIAGNOSTIC AND RADIOLOGICAL DATA:  White count 14.5, hemoglobin 12.7, hematocrit 38.5, platelets 348 with an MCV of 89.0. Glucose is 118, BUN is 33, creatinine 1.39, last known creatinine is 1.2, sodium is 140, potassium 4, chloride 105, bicarbonate 26, calcium 9.6, bilirubin 0.5, alkaline phosphatase 93, ALT 170, AST 342, total protein 7.6, albumin 4.1, osmolality 288, anion gap of 9.0. Lipase is greater than 3000. Troponin is less than 0.01.  EKG shows normal sinus rhythm at 61 beats per minute. ?inferior infarct, age undetermined.  Abdominal ultrasound shows cholelithiasis without evidence of acute cholecystitis. No dilatation of the common bile duct. There is a dilated pancreatic duct.   ASSESSMENT AND PLAN: The patient is a 69 year old female presenting with abdominal pain, chest pressure, nausea, vomiting and diarrhea, found to have elevated lipase greater than 3000, most concerning for acute pancreatitis.   1. Acute pancreatitis: The patient will be placed n.p.o. and aggressively hydrated with normal saline. GI has been consulted and will determine any need for further intervention such as ERCP for the dilated pancreatic duct.  We will continue IV pain medications as needed. If symptoms begin to worsen, we will consider abdominal CT to rule out necrotizing pancreatitis. The patient, at this point, is very nontoxic-appearing. We will check fasting lipid panel.  2. Chest pressure: I suspect that this is more as a result of the acute pancreatitis rather than a  concomitant acute coronary disease. We will trend cardiac enzymes just to monitor this.  3. Hypertension: IV metoprolol for now and holding p.o. meds.  4. Hyperlipidemia: Check fasting lipids, hold p.o. meds at this point.  5. Coronary artery disease: Given chest pressure, we will cycle cardiac enzymes. Most likely this is due to acute pancreatitis. Initial troponins and EKG are negative.  6. Prophylaxis: Lovenox.  7. Disposition: The patient is being admitted inpatient for management of acute pancreatitis. I anticipate that she will require 2 hospital midnight stays for evaluation and management.   CODE STATUS: The patient is a FULL CODE.  Her surrogate decision maker is her husband, Valentine Kuechle.    TIME SPENT COORDINATING ADMISSION: 45 minutes.   ____________________________ Samson Frederic, DO aeo:cb D: 04/09/2012 03:49:39 ET T: 04/09/2012 08:15:12 ET JOB#: [id number]  cc: Samson Frederic, DO, <Dictator> Yittel Emrich E Bonne Whack DO ELECTRONICALLY SIGNED 06/01/2012 3:45

## 2014-08-07 DIAGNOSIS — E782 Mixed hyperlipidemia: Secondary | ICD-10-CM | POA: Diagnosis not present

## 2014-08-07 DIAGNOSIS — R0602 Shortness of breath: Secondary | ICD-10-CM | POA: Diagnosis not present

## 2014-08-07 DIAGNOSIS — I2510 Atherosclerotic heart disease of native coronary artery without angina pectoris: Secondary | ICD-10-CM | POA: Diagnosis not present

## 2014-08-07 DIAGNOSIS — I10 Essential (primary) hypertension: Secondary | ICD-10-CM | POA: Diagnosis not present

## 2014-09-19 ENCOUNTER — Other Ambulatory Visit: Payer: Self-pay | Admitting: Family Medicine

## 2014-10-02 DIAGNOSIS — H25049 Posterior subcapsular polar age-related cataract, unspecified eye: Secondary | ICD-10-CM | POA: Diagnosis not present

## 2014-10-02 DIAGNOSIS — H43391 Other vitreous opacities, right eye: Secondary | ICD-10-CM | POA: Diagnosis not present

## 2014-10-02 DIAGNOSIS — H3532 Exudative age-related macular degeneration: Secondary | ICD-10-CM | POA: Diagnosis not present

## 2014-10-02 DIAGNOSIS — H3531 Nonexudative age-related macular degeneration: Secondary | ICD-10-CM | POA: Diagnosis not present

## 2014-10-19 ENCOUNTER — Telehealth: Payer: Self-pay

## 2014-10-19 NOTE — Telephone Encounter (Signed)
Pt called about needing a refill on metoprolol tartrate (LOPRESSOR) 25 MG tablet by [facility] because she is going out of town in a week.  Please advise [phone]

## 2014-10-19 NOTE — Telephone Encounter (Signed)
Left message for pt to call back  °

## 2014-10-19 NOTE — Telephone Encounter (Signed)
Error

## 2014-10-24 MED ORDER — METOPROLOL TARTRATE 25 MG PO TABS: 25.0000 mg | ORAL_TABLET | Freq: Two times a day (BID) | ORAL | Status: DC

## 2014-10-24 NOTE — Telephone Encounter (Signed)
Rx sent.  Pt notified. 

## 2014-10-25 ENCOUNTER — Ambulatory Visit (INDEPENDENT_AMBULATORY_CARE_PROVIDER_SITE_OTHER): Payer: Medicare Other | Admitting: Family Medicine

## 2014-10-25 ENCOUNTER — Encounter: Payer: Self-pay | Admitting: Family Medicine

## 2014-10-25 VITALS — BP 130/60 | HR 65 | Temp 99.0°F | Resp 16 | Ht 67.0 in | Wt 252.0 lb

## 2014-10-25 DIAGNOSIS — Z23 Encounter for immunization: Secondary | ICD-10-CM

## 2014-10-25 DIAGNOSIS — I2510 Atherosclerotic heart disease of native coronary artery without angina pectoris: Secondary | ICD-10-CM

## 2014-10-25 DIAGNOSIS — F419 Anxiety disorder, unspecified: Secondary | ICD-10-CM

## 2014-10-25 DIAGNOSIS — R7302 Impaired glucose tolerance (oral): Secondary | ICD-10-CM | POA: Diagnosis not present

## 2014-10-25 DIAGNOSIS — F418 Other specified anxiety disorders: Secondary | ICD-10-CM | POA: Diagnosis not present

## 2014-10-25 DIAGNOSIS — E7800 Pure hypercholesterolemia, unspecified: Secondary | ICD-10-CM

## 2014-10-25 DIAGNOSIS — F32A Depression, unspecified: Secondary | ICD-10-CM

## 2014-10-25 DIAGNOSIS — I10 Essential (primary) hypertension: Secondary | ICD-10-CM | POA: Diagnosis not present

## 2014-10-25 DIAGNOSIS — E2839 Other primary ovarian failure: Secondary | ICD-10-CM

## 2014-10-25 DIAGNOSIS — F329 Major depressive disorder, single episode, unspecified: Secondary | ICD-10-CM

## 2014-10-25 LAB — COMPREHENSIVE METABOLIC PANEL
ALK PHOS: 56 U/L (ref 39–117)
ALT: 14 U/L (ref 0–35)
AST: 18 U/L (ref 0–37)
Albumin: 4.4 g/dL (ref 3.5–5.2)
BILIRUBIN TOTAL: 0.7 mg/dL (ref 0.2–1.2)
BUN: 22 mg/dL (ref 6–23)
CO2: 25 meq/L (ref 19–32)
Calcium: 10 mg/dL (ref 8.4–10.5)
Chloride: 103 mEq/L (ref 96–112)
Creat: 1.18 mg/dL — ABNORMAL HIGH (ref 0.50–1.10)
Glucose, Bld: 114 mg/dL — ABNORMAL HIGH (ref 70–99)
Potassium: 5.4 mEq/L — ABNORMAL HIGH (ref 3.5–5.3)
Sodium: 137 mEq/L (ref 135–145)
Total Protein: 7.2 g/dL (ref 6.0–8.3)

## 2014-10-25 LAB — LIPID PANEL
CHOL/HDL RATIO: 3 ratio
Cholesterol: 175 mg/dL (ref 0–200)
HDL: 58 mg/dL (ref >=46)
LDL Cholesterol: 89 mg/dL (ref 0–99)
TRIGLYCERIDES: 139 mg/dL (ref <150)
VLDL: 28 mg/dL (ref 0–40)

## 2014-10-25 LAB — CBC WITH DIFFERENTIAL/PLATELET
Basophils Absolute: 0 10*3/uL (ref 0.0–0.1)
Basophils Relative: 0 % (ref 0–1)
Eosinophils Absolute: 0.2 10*3/uL (ref 0.0–0.7)
Eosinophils Relative: 3 % (ref 0–5)
HCT: 38 % (ref 36.0–46.0)
Hemoglobin: 12.9 g/dL (ref 12.0–15.0)
LYMPHS ABS: 1.4 10*3/uL (ref 0.7–4.0)
Lymphocytes Relative: 25 % (ref 12–46)
MCH: 29.8 pg (ref 26.0–34.0)
MCHC: 33.9 g/dL (ref 30.0–36.0)
MCV: 87.8 fL (ref 78.0–100.0)
MONOS PCT: 10 % (ref 3–12)
MPV: 8.8 fL (ref 8.6–12.4)
Monocytes Absolute: 0.6 10*3/uL (ref 0.1–1.0)
Neutro Abs: 3.5 10*3/uL (ref 1.7–7.7)
Neutrophils Relative %: 62 % (ref 43–77)
Platelets: 301 10*3/uL (ref 150–400)
RBC: 4.33 MIL/uL (ref 3.87–5.11)
RDW: 14.2 % (ref 11.5–15.5)
WBC: 5.6 10*3/uL (ref 4.0–10.5)

## 2014-10-25 LAB — HEMOGLOBIN A1C
Hgb A1c MFr Bld: 6.2 % — ABNORMAL HIGH (ref <5.7)
Mean Plasma Glucose: 131 mg/dL — ABNORMAL HIGH (ref <117)

## 2014-10-25 NOTE — Progress Notes (Signed)
Subjective:    Patient ID: Gail Burns, female    DOB: 04/25/1945, 69 y.o.   MRN: [id number]  10/25/2014  Follow-up   HPI This 69 y.o. female presents for six month follow-up:  1. HTN:  Patient reports good compliance with medication, good tolerance to medication, and good symptom control.  Potassium level elevated at last visit; stopped energy shake due to excessive potassium.    2.  Hyperlipidemia: Patient reports good compliance with medication, good tolerance to medication, and good symptom control.    3.  Glucose Intolerance: cancelled gym membership.  Not focusing on diet and exercise.  4.  CAD: excessive sweating; drip off of hair and ears.  Fan in bathroom and sweats like crazy.  Denies CP/palp/SOB/leg swelling.  Thinks excessive sweating due to obesity or medication side effect.  5. Depression with anxiety:  Patient reports good compliance with medication, good tolerance to medication, and good symptom control.    6. GERD: Patient reports good compliance with medication, good tolerance to medication, and good symptom control.    7. Obesity: considering gastric bypass.  Has not researched insurance coverage.  Tired of being fat.  Just eats a lot; eats at nighttime.    Going to [place] for three weeks.    Review of Systems  Constitutional: Positive for diaphoresis. Negative for fever, chills and fatigue.  Eyes: Negative for visual disturbance.  Respiratory: Negative for cough and shortness of breath.   Cardiovascular: Negative for chest pain, palpitations and leg swelling.  Gastrointestinal: Negative for nausea, vomiting, abdominal pain, diarrhea and constipation.  Endocrine: Negative for cold intolerance, heat intolerance, polydipsia, polyphagia and polyuria.  Skin: Negative for color change and wound.  Neurological: Negative for dizziness, tremors, seizures, syncope, facial asymmetry, speech difficulty, weakness, light-headedness, numbness and headaches.    Psychiatric/Behavioral: Negative for suicidal ideas, sleep disturbance, self-injury and dysphoric mood. The patient is not nervous/anxious.     Past Medical History  Diagnosis Date  . Anxiety   . Depression   . Hyperlipidemia   . Hypertension   . Degeneration of lumbar or lumbosacral intervertebral disc   . Other dyspnea and respiratory abnormality   . Other malaise and fatigue   . Benign neoplasm of colon   . Basal cell carcinoma   . Seborrheic dermatitis, unspecified   . Coronary atherosclerosis of native coronary artery   . Obesity, unspecified   . Tobacco use disorder   . Esophageal reflux   . Impaired glucose tolerance test   . Internal hemorrhoids without mention of complication   . Diverticulosis of colon (without mention of hemorrhage)   . Unspecified disorder of kidney and ureter   . Macular degeneration   . Pancreatitis due to biliary obstruction 08/05/2012    two week admission in [place].  Step down unit.   Past Surgical History  Procedure Laterality Date  . Cardiac catheterization  2006    80% blockage  . Dilatation and curettage  x 2  . Tubal ligation  1982  . Knee surgery      Right  . Eye lid surgery  2012  . Laser therapy and injections for macular degeneration    . Cholecystectomy    . Admission  08/05/2012    Acute pancreatitis. [place]. Two week admission; step down unit.  . Eye surgery      per patient's health survey - [facility]   Allergies  Allergen Reactions  . Augmentin [Amoxicillin-Pot Clavulanate] Hives   Current Outpatient Prescriptions  Medication  Sig Dispense Refill  . aspirin 81 MG tablet Take 81 mg by mouth daily.    . calcium carbonate (OS-CAL) 600 MG TABS Take 600 mg by mouth 2 (two) times daily with a meal.    . CRESTOR 40 MG tablet TAKE 1 TABLET DAILY 90 tablet 2  . escitalopram (LEXAPRO) 20 MG tablet TAKE 1 TABLET DAILY 90 tablet 2  . esomeprazole (NEXIUM) 40 MG capsule TAKE 1 CAPSULE DAILY AT 12 NOON 90 capsule 2  . fenofibrate  (TRICOR) 145 MG tablet TAKE 1 TABLET DAILY 90 tablet 2  . meloxicam (MOBIC) 15 MG tablet TAKE 1 TABLET DAILY 90 tablet 2  . metoprolol tartrate (LOPRESSOR) 25 MG tablet Take 1 tablet (25 mg total) by mouth 2 (two) times daily. 60 tablet 0  . Misc Natural Products (LUTEIN VISION BLEND PO) Take by mouth daily.    . Multiple Vitamin (MULTIVITAMIN) tablet Take 1 tablet by mouth daily.    Marland Kitchen ESTRACE VAGINAL 0.1 MG/GM vaginal cream PLACE 1 APPLICATOR VAGINALLY ONCE A WEEK 42.5 g 1   No current facility-administered medications for this visit.   Social History   Social History  . Marital Status: Married    Spouse Name: N/A  . Number of Children: 2  . Years of Education: 12   Occupational History  . substitute school teacher    Social History Main Topics  . Smoking status: Former Smoker -- 0.50 packs/day for 40 years    Types: Cigarettes    Quit date: 02/18/1990  . Smokeless tobacco: Not on file     Comment: quit 1991  . Alcohol Use: No     Comment: occasional twice a month;  per patient health survery form - No  . Drug Use: No  . Sexual Activity: Not Currently     Comment: due to atrophic vaginitis   Other Topics Concern  . Not on file   Social History Narrative   Marital status: married x 50 yrs, moderately happily married, no abuse, moved from [place] in 2007.  Husband with infidelity in past and in 2013.      Children:2 children; 4 grandchildren;  live in [place]      Lives: with husband.      Employment: unemployed.      Tobacco: none       Alcohol:  None       Drugs: none      Exercise: Minimal      Caffeine use: coffee 1 serving/day.      Always uses seat belts, smoke alarm in the home, no guns in the home.      ADLs: independent; drives.      Living Will:  FULL CODE; no prolonged measures. Has living will.     Family History  Problem Relation Age of Onset  . Cancer Mother   . Heart disease Mother       CAD     has pacemaker  . Dementia Mother   . Diabetes Mother    . Macular degeneration Mother   . Heart disease Father   . Rheumatic fever Father   . Heart disease Sister 62    CABG  . Hyperlipidemia Sister   . Stroke Sister 9    TIA's  . Heart disease Brother     CABG age 66  . COPD Brother   . Heart disease Maternal Grandfather   . Hyperlipidemia Sister   . Hypertension Sister   . Arthritis Sister  DDD lumbar s/p surgery  . Parkinson's disease Maternal Grandmother   . Heart disease Paternal Grandmother        Objective:    BP 130/60 mmHg  Pulse 65  Temp(Src) 99 F (37.2 C) (Oral)  Resp 16  Ht 5\' 7"  (1.702 m)  Wt 252 lb (114.306 kg)  BMI 39.46 kg/m2  SpO2 95% Physical Exam  Constitutional: She is oriented to person, place, and time. She appears well-developed and well-nourished. No distress.  HENT:  Head: Normocephalic and atraumatic.  Right Ear: External ear normal.  Left Ear: External ear normal.  Nose: Nose normal.  Mouth/Throat: Oropharynx is clear and moist.  Eyes: Conjunctivae and EOM are normal. Pupils are equal, round, and reactive to light.  Neck: Normal range of motion. Neck supple. Carotid bruit is not present. No thyromegaly present.  Cardiovascular: Normal rate, regular rhythm, normal heart sounds and intact distal pulses.  Exam reveals no gallop and no friction rub.   No murmur heard. Pulmonary/Chest: Effort normal and breath sounds normal. She has no wheezes. She has no rales.  Abdominal: Soft. Bowel sounds are normal. She exhibits no distension and no mass. There is no tenderness. There is no rebound and no guarding.  Lymphadenopathy:    She has no cervical adenopathy.  Neurological: She is alert and oriented to person, place, and time. No cranial nerve deficit.  Skin: Skin is warm and dry. No rash noted. She is not diaphoretic. No erythema. No pallor.  Psychiatric: She has a normal mood and affect. Her behavior is normal.   PNEUMOVAX ADMINISTERED.     Assessment & Plan:   1. Pure  hypercholesterolemia   2. Essential hypertension, benign   3. Coronary artery disease involving native coronary artery of native heart without angina pectoris   4. Glucose intolerance (impaired glucose tolerance)   5. Anxiety and depression   6. Estrogen deficiency   7. Need for prophylactic vaccination against Streptococcus pneumoniae (pneumococcus)    1. Hypercholesterolemia: controlled; obtain labs; continue current medications. 2.  HTN: controlled; obtain labs; continue current medications. 3.  CAD: asymptomatic; s/p recent follow-up with cardiology/Kowalski. 4.  Glucose intolerance: stable; recommend weight loss, exercise, dietary modification. 5.  Anxiety and depression: stable/controlled; no changes to management. 6.  Estrogen deficiency: refer for bone density scan. 7. S/p Pneumovax.   Orders Placed This Encounter  Procedures  . DG Bone Density    Standing Status: Future     Number of Occurrences:      Standing Expiration Date: 12/26/2015    Order Specific Question:  Reason for Exam (SYMPTOM  OR DIAGNOSIS REQUIRED)    Answer:  estrogen deficiency; screening osteporosis    Order Specific Question:  Preferred imaging location?    Answer:   [facility]  . Pneumococcal polysaccharide vaccine 23-valent greater than or equal to 2yo subcutaneous/IM  . CBC with Differential/Platelet  . Comprehensive metabolic panel    Order Specific Question:  Has the patient fasted?    Answer:  Yes  . Hemoglobin A1c  . Lipid panel    Order Specific Question:  Has the patient fasted?    Answer:  Yes    No orders of the defined types were placed in this encounter.    Return in about 6 months (around 04/27/2015) for complete physical examiniation.   Azilee Pirro Elayne Guerin, M.D. [facility] [address] [place], [place]  [address] [phone] phone [phone] fax

## 2014-11-29 DIAGNOSIS — L568 Other specified acute skin changes due to ultraviolet radiation: Secondary | ICD-10-CM | POA: Diagnosis not present

## 2014-11-29 DIAGNOSIS — L570 Actinic keratosis: Secondary | ICD-10-CM | POA: Diagnosis not present

## 2014-11-29 DIAGNOSIS — L818 Other specified disorders of pigmentation: Secondary | ICD-10-CM | POA: Diagnosis not present

## 2014-12-01 ENCOUNTER — Other Ambulatory Visit: Payer: Self-pay | Admitting: Physician Assistant

## 2014-12-26 ENCOUNTER — Ambulatory Visit
Admission: RE | Admit: 2014-12-26 | Discharge: 2014-12-26 | Disposition: A | Discharge status: Home / Self Care | Payer: Medicare Other | Source: Ambulatory Visit | Attending: Family Medicine | Admitting: Family Medicine

## 2014-12-26 DIAGNOSIS — M85861 Other specified disorders of bone density and structure, right lower leg: Secondary | ICD-10-CM | POA: Insufficient documentation

## 2014-12-26 DIAGNOSIS — E2839 Other primary ovarian failure: Secondary | ICD-10-CM | POA: Diagnosis not present

## 2014-12-26 DIAGNOSIS — M8589 Other specified disorders of bone density and structure, multiple sites: Secondary | ICD-10-CM | POA: Diagnosis not present

## 2014-12-26 DIAGNOSIS — Z780 Asymptomatic menopausal state: Secondary | ICD-10-CM | POA: Diagnosis not present

## 2014-12-26 DIAGNOSIS — Z13820 Encounter for screening for osteoporosis: Secondary | ICD-10-CM | POA: Diagnosis not present

## 2015-01-01 ENCOUNTER — Telehealth: Payer: Self-pay

## 2015-01-01 MED ORDER — MELOXICAM 15 MG PO TABS: 15.0000 mg | ORAL_TABLET | Freq: Every day | ORAL | Status: DC

## 2015-01-01 NOTE — Telephone Encounter (Signed)
Pt is going to [place] [place] tomorrow morning for bike week and is neededing to get a refill on her iburpofen that she has for back pain that she only uses for long trips like this   Best number [facility] is [facility]  [place]

## 2015-01-01 NOTE — Telephone Encounter (Signed)
Spoke with pt, I advised her of mario's message. Pt states she has been doing this for years and wants Dr. Tamala Julian to review.

## 2015-01-01 NOTE — Telephone Encounter (Signed)
I do not see ibuprofen but I will ll her meloxicam. Please let her know that she should not be using both ibuprofen and meloxicam at the same time. In fact, for her age group, she should be using Tylenol instead.

## 2015-01-01 NOTE — Telephone Encounter (Signed)
Can we refill? 

## 2015-01-02 MED ORDER — IBUPROFEN 600 MG PO TABS: 600.0000 mg | ORAL_TABLET | Freq: Three times a day (TID) | ORAL | Status: DC | PRN

## 2015-01-02 NOTE — Telephone Encounter (Signed)
Left VM, no answer, I just wanted to inform her Dr. Tamala Julian filled her ibuprofen and it is at the pharmacy, and to not take the meloxicam and the ibuprofen at the same time.

## 2015-01-02 NOTE — Telephone Encounter (Signed)
Call --- 1. I have approved a small amount of Ibuprofen for patient; looks like I have not refilled in two years..  2. I agree with PA Bess Harvest that patient should not be taking Ibuprofen and Meloxicam at the same time.  While taking Ibuprofen, pt should HOLD Meloxicam.

## 2015-01-03 ENCOUNTER — Other Ambulatory Visit: Payer: Self-pay | Admitting: Family Medicine

## 2015-01-04 MED ORDER — METOPROLOL TARTRATE 25 MG PO TABS: ORAL_TABLET | ORAL | Status: DC

## 2015-01-04 NOTE — Addendum Note (Signed)
Addended by: Elwyn Reach A on: 01/04/2015 03:05 PM   Modules accepted: Orders

## 2015-01-08 MED ORDER — METOPROLOL TARTRATE 25 MG PO TABS: ORAL_TABLET | ORAL | Status: DC

## 2015-01-08 NOTE — Addendum Note (Signed)
Addended by: Elwyn Reach A on: 01/08/2015 12:38 PM   Modules accepted: Orders

## 2015-02-05 DIAGNOSIS — H353122 Nonexudative age-related macular degeneration, left eye, intermediate dry stage: Secondary | ICD-10-CM | POA: Diagnosis not present

## 2015-02-05 DIAGNOSIS — H353213 Exudative age-related macular degeneration, right eye, with inactive scar: Secondary | ICD-10-CM | POA: Diagnosis not present

## 2015-02-07 DIAGNOSIS — R0602 Shortness of breath: Secondary | ICD-10-CM | POA: Diagnosis not present

## 2015-02-07 DIAGNOSIS — I10 Essential (primary) hypertension: Secondary | ICD-10-CM | POA: Diagnosis not present

## 2015-02-07 DIAGNOSIS — E782 Mixed hyperlipidemia: Secondary | ICD-10-CM | POA: Diagnosis not present

## 2015-02-07 DIAGNOSIS — I2510 Atherosclerotic heart disease of native coronary artery without angina pectoris: Secondary | ICD-10-CM | POA: Diagnosis not present

## 2015-02-07 DIAGNOSIS — K219 Gastro-esophageal reflux disease without esophagitis: Secondary | ICD-10-CM | POA: Diagnosis not present

## 2015-02-14 DIAGNOSIS — L570 Actinic keratosis: Secondary | ICD-10-CM | POA: Diagnosis not present

## 2015-02-14 DIAGNOSIS — L821 Other seborrheic keratosis: Secondary | ICD-10-CM | POA: Diagnosis not present

## 2015-04-01 ENCOUNTER — Other Ambulatory Visit: Payer: Self-pay | Admitting: Family Medicine

## 2015-04-02 ENCOUNTER — Other Ambulatory Visit: Payer: Self-pay | Admitting: Family Medicine

## 2015-04-10 ENCOUNTER — Other Ambulatory Visit: Payer: Self-pay | Admitting: Family Medicine

## 2015-04-12 ENCOUNTER — Other Ambulatory Visit: Payer: Self-pay | Admitting: Family Medicine

## 2015-04-24 ENCOUNTER — Ambulatory Visit (INDEPENDENT_AMBULATORY_CARE_PROVIDER_SITE_OTHER): Payer: Medicare Other | Admitting: Family Medicine

## 2015-04-24 VITALS — BP 116/74 | HR 61 | Temp 98.6°F | Resp 16 | Ht 67.0 in | Wt 257.8 lb

## 2015-04-24 DIAGNOSIS — Z23 Encounter for immunization: Secondary | ICD-10-CM

## 2015-04-24 DIAGNOSIS — Z7189 Other specified counseling: Secondary | ICD-10-CM | POA: Diagnosis not present

## 2015-04-24 DIAGNOSIS — J0100 Acute maxillary sinusitis, unspecified: Secondary | ICD-10-CM | POA: Diagnosis not present

## 2015-04-24 DIAGNOSIS — Z7185 Encounter for immunization safety counseling: Secondary | ICD-10-CM

## 2015-04-24 MED ORDER — LEVOFLOXACIN 500 MG PO TABS: 500.0000 mg | ORAL_TABLET | Freq: Every day | ORAL | Status: DC

## 2015-04-24 NOTE — Patient Instructions (Signed)

## 2015-04-24 NOTE — Progress Notes (Signed)
Subjective:  This chart was scribed for Robyn Haber MD, by Tamsen Roers, at [facility].  This patient was seen in room  10 and the patient's care was started at 8:55 AM.   Chief Complaint  Patient presents with   sinus problem    x 2 weeks   Cough   Sore Throat   Flu Vaccine    if she is well enough she would like it     Patient ID: Gail Burns, female    DOB: 1946/01/31, 70 y.o.   MRN: [id number]  HPI  HPI Comments: Gail Burns is a 70 y.o. female who presents to the [facility] complaining of multiple symptoms including a sore throat, cough, eye pain and sinus congestion onset two weeks ago.  She has associated symptoms of weakness, facial pain and a headache.  Patient states that her symptoms have worn her down and feels like she is "swallowing rubber bands". She has taken Sudafed and Mucin-ex but denies any relief. She has never taken Levaquin in the past.  She had a sinus infection last year which she needed to see a doctor for as she was not able to take care of it herself.   Patient has three grandchildren and spends plenty of time with them. 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    Patient is also requesting a flu shot today.    Patient Active Problem List   Diagnosis Date Noted   Abnormal colonoscopy 01/23/2014   Personal history of colonic polyps 12/30/2013   Unspecified menopausal and postmenopausal disorder 10/27/2013   Routine general medical examination at a health care facility 09/20/2012   Pancreatitis, acute 09/20/2012   Dyspnea 09/20/2012   Pure hypercholesterolemia 03/16/2012   Other abnormal glucose 03/16/2012   Essential hypertension, benign 03/16/2012   Coronary artery disease 03/16/2012   Depression 03/16/2012   Past Medical History  Diagnosis Date   Anxiety    Depression    Hyperlipidemia    Hypertension    Degeneration of lumbar or  lumbosacral intervertebral disc    Other dyspnea and respiratory abnormality    Other malaise and fatigue    Benign neoplasm of colon    Basal cell carcinoma    Seborrheic dermatitis, unspecified    Coronary atherosclerosis of native coronary artery    Obesity, unspecified    Tobacco use disorder    Esophageal reflux    Impaired glucose tolerance test    Internal hemorrhoids without mention of complication    Diverticulosis of colon (without mention of hemorrhage)    Unspecified disorder of kidney and ureter    Macular degeneration    Pancreatitis due to biliary obstruction 08/05/2012    two week admission in [place].  Step down unit.   Past Surgical History  Procedure Laterality Date   Cardiac catheterization  2006    80% blockage   Dilatation and curettage  x 2   Tubal ligation  1982   Knee surgery      Right   Eye lid surgery  2012   Laser therapy and injections for macular degeneration     Cholecystectomy     Admission  08/05/2012    Acute pancreatitis. [place]. Two week admission; step down unit.   Eye surgery      per patient's health survey - [facility]   Allergies  Allergen Reactions   Augmentin [Amoxicillin-Pot Clavulanate] Hives   Prior  to Admission medications   Medication Sig Start Date End Date Taking? Authorizing Provider  aspirin 81 MG tablet Take 81 mg by mouth daily.   Yes Historical Provider, MD  calcium carbonate (OS-CAL) 600 MG TABS Take 600 mg by mouth 2 (two) times daily with a meal.   Yes Historical Provider, MD  escitalopram (LEXAPRO) 20 MG tablet TAKE 1 TABLET DAILY 04/03/15  Yes Wardell Honour, MD  esomeprazole (NEXIUM) 40 MG capsule TAKE 1 CAPSULE DAILY AT 12 NOON 04/03/15  Yes Wardell Honour, MD  fenofibrate (TRICOR) 145 MG tablet TAKE 1 TABLET DAILY 04/03/15  Yes Wardell Honour, MD  meloxicam (MOBIC) 15 MG tablet TAKE 1 TABLET DAILY 04/10/15  Yes Wardell Honour, MD  metoprolol tartrate (LOPRESSOR) 25 MG tablet TAKE 1 TABLET  TWICE A DAY 04/02/15  Yes Wardell Honour, MD  Misc Natural Products (LUTEIN VISION BLEND PO) Take by mouth daily.   Yes Historical Provider, MD  Multiple Vitamin (MULTIVITAMIN) tablet Take 1 tablet by mouth daily.   Yes Historical Provider, MD  rosuvastatin (CRESTOR) 40 MG tablet TAKE 1 TABLET DAILY 04/03/15  Yes Wardell Honour, MD  ESTRACE VAGINAL 0.1 MG/GM vaginal cream PLACE 1 APPLICATOR VAGINALLY ONCE A WEEK Patient not taking: Reported on 04/24/2015 12/04/14   Wardell Honour, MD  ibuprofen (ADVIL,MOTRIN) 600 MG tablet Take 1 tablet (600 mg total) by mouth every 8 (eight) hours as needed. Patient not taking: Reported on 04/24/2015 01/02/15   Wardell Honour, MD   Social History   Social History   Marital Status: Married    Spouse Name: N/A   Number of Children: 2   Years of Education: 12   Occupational History   substitute school teacher    Social History Main Topics   Smoking status: Former Smoker -- 0.50 packs/day for 40 years    Types: Cigarettes    Quit date: 02/18/1990   Smokeless tobacco: Not on file     Comment: quit 1991   Alcohol Use: No     Comment: occasional twice a month;  per patient health survery form - No   Drug Use: No   Sexual Activity: Not Currently     Comment: due to atrophic vaginitis   Other Topics Concern   Not on file   Social History Narrative   Marital status: married x 50 yrs, moderately happily married, no abuse, moved from [place] in 2007.  Husband with infidelity in past and in 2013.      Children:2 children; 4 grandchildren;  live in [place]      Lives: with husband.      Employment: unemployed.      Tobacco: none       Alcohol:  None       Drugs: none      Exercise: Minimal      Caffeine use: coffee 1 serving/day.      Always uses seat belts, smoke alarm in the home, no guns in the home.      ADLs: independent; drives.      Living Will:  FULL CODE; no prolonged measures. Has living will.        Review of Systems    Constitutional: Negative for fever, chills and diaphoresis.  HENT: Positive for congestion and sore throat.   Eyes: Positive for pain.  Respiratory: Positive for cough. Negative for choking and shortness of breath.   Gastrointestinal: Negative for nausea and vomiting.  Musculoskeletal: Negative for neck pain  and neck stiffness.  Skin: Negative for color change and rash.  Neurological: Positive for weakness and headaches. Negative for syncope and speech difficulty.       Objective:   Physical Exam  Constitutional: She is oriented to person, place, and time. She appears well-developed and well-nourished. No distress.  HENT:  Head: Normocephalic and atraumatic.  Mucopurulent discharge in both nasal passages.   Eyes: Pupils are equal, round, and reactive to light.  Neck: Normal range of motion.  Cardiovascular: Normal rate.   Pulmonary/Chest: Effort normal and breath sounds normal. No respiratory distress.  Neurological: She is alert and oriented to person, place, and time.  Skin: Skin is warm.  Psychiatric: She has a normal mood and affect. Her behavior is normal.   Filed Vitals:   04/24/15 0842  BP: 116/74  Pulse: 61  Temp: 98.6 F (37 C)  TempSrc: Oral  Resp: 16  Height: 5\' 7"  (1.702 m)  Weight: 257 lb 12.8 oz (116.937 kg)  SpO2: 96%          Assessment & Plan:   This chart was scribed in my presence and reviewed by me personally.    ICD-9-CM ICD-10-CM   1. Acute maxillary sinusitis, recurrence not specified 461.0 J01.00 levofloxacin (LEVAQUIN) 500 MG tablet  2. Immunization counseling V65.49 Z71.89 Flu Vaccine QUAD 36+ mos IM  3. Needs flu shot V04.81 Z23 Flu Vaccine QUAD 36+ mos IM     Signed, Robyn Haber, MD

## 2015-04-29 ENCOUNTER — Other Ambulatory Visit: Payer: Self-pay | Admitting: Family Medicine

## 2015-05-02 ENCOUNTER — Encounter: Payer: Self-pay | Admitting: Family Medicine

## 2015-05-04 ENCOUNTER — Encounter: Payer: Medicare Other | Admitting: Family Medicine

## 2015-05-04 NOTE — Telephone Encounter (Signed)
Yes, please refill all medications until July.

## 2015-05-04 NOTE — Telephone Encounter (Signed)
Pt's appt was rescheduled from Jan to July. Can we refill this and other chronic meds until then?

## 2015-06-09 DIAGNOSIS — J101 Influenza due to other identified influenza virus with other respiratory manifestations: Secondary | ICD-10-CM | POA: Diagnosis not present

## 2015-06-09 DIAGNOSIS — M791 Myalgia: Secondary | ICD-10-CM | POA: Diagnosis not present

## 2015-06-29 ENCOUNTER — Other Ambulatory Visit: Payer: Self-pay | Admitting: Family Medicine

## 2015-06-30 ENCOUNTER — Other Ambulatory Visit: Payer: Self-pay | Admitting: Family Medicine

## 2015-07-08 ENCOUNTER — Other Ambulatory Visit: Payer: Self-pay | Admitting: Family Medicine

## 2015-07-16 DIAGNOSIS — H353122 Nonexudative age-related macular degeneration, left eye, intermediate dry stage: Secondary | ICD-10-CM | POA: Diagnosis not present

## 2015-07-16 DIAGNOSIS — H353213 Exudative age-related macular degeneration, right eye, with inactive scar: Secondary | ICD-10-CM | POA: Diagnosis not present

## 2015-08-15 DIAGNOSIS — I2510 Atherosclerotic heart disease of native coronary artery without angina pectoris: Secondary | ICD-10-CM | POA: Diagnosis not present

## 2015-08-15 DIAGNOSIS — E782 Mixed hyperlipidemia: Secondary | ICD-10-CM | POA: Diagnosis not present

## 2015-08-15 DIAGNOSIS — E669 Obesity, unspecified: Secondary | ICD-10-CM | POA: Diagnosis not present

## 2015-08-15 DIAGNOSIS — K219 Gastro-esophageal reflux disease without esophagitis: Secondary | ICD-10-CM | POA: Diagnosis not present

## 2015-08-15 DIAGNOSIS — R0602 Shortness of breath: Secondary | ICD-10-CM | POA: Diagnosis not present

## 2015-08-15 DIAGNOSIS — I10 Essential (primary) hypertension: Secondary | ICD-10-CM | POA: Diagnosis not present

## 2015-08-22 DIAGNOSIS — L814 Other melanin hyperpigmentation: Secondary | ICD-10-CM | POA: Diagnosis not present

## 2015-08-22 DIAGNOSIS — L821 Other seborrheic keratosis: Secondary | ICD-10-CM | POA: Diagnosis not present

## 2015-08-22 DIAGNOSIS — L570 Actinic keratosis: Secondary | ICD-10-CM | POA: Diagnosis not present

## 2015-09-26 ENCOUNTER — Ambulatory Visit (INDEPENDENT_AMBULATORY_CARE_PROVIDER_SITE_OTHER): Payer: Medicare Other | Admitting: Family Medicine

## 2015-09-26 ENCOUNTER — Ambulatory Visit (INDEPENDENT_AMBULATORY_CARE_PROVIDER_SITE_OTHER): Payer: Medicare Other

## 2015-09-26 VITALS — BP 122/74 | HR 60 | Temp 98.6°F | Resp 16 | Ht 67.0 in | Wt 257.2 lb

## 2015-09-26 DIAGNOSIS — M25562 Pain in left knee: Secondary | ICD-10-CM

## 2015-09-26 DIAGNOSIS — M79672 Pain in left foot: Secondary | ICD-10-CM

## 2015-09-26 DIAGNOSIS — M7732 Calcaneal spur, left foot: Secondary | ICD-10-CM | POA: Diagnosis not present

## 2015-09-26 DIAGNOSIS — S83412A Sprain of medial collateral ligament of left knee, initial encounter: Secondary | ICD-10-CM

## 2015-09-26 DIAGNOSIS — M179 Osteoarthritis of knee, unspecified: Secondary | ICD-10-CM | POA: Diagnosis not present

## 2015-09-26 DIAGNOSIS — S96912A Strain of unspecified muscle and tendon at ankle and foot level, left foot, initial encounter: Secondary | ICD-10-CM | POA: Diagnosis not present

## 2015-09-26 MED ORDER — DICLOFENAC SODIUM 1 % TD GEL: 2.0000 g | Freq: Four times a day (QID) | TRANSDERMAL | Status: DC

## 2015-09-26 MED ORDER — IBUPROFEN 800 MG PO TABS: 800.0000 mg | ORAL_TABLET | Freq: Three times a day (TID) | ORAL | Status: DC | PRN

## 2015-09-26 NOTE — Progress Notes (Signed)
Subjective:    Patient ID: Gail Burns, female    DOB: November 27, 1945, 70 y.o.   MRN: [id number]  09/26/2015  Knee Pain   HPI This 70 y.o. female presents for evaluation of L medial knee pain.  Onset one month ago. Medial knee pain.  With certain movements.  Limping.  +knee swelling.  Knee can give out.  No popping.  Taking Tylenol Arthritis one day; taking Meloxicam every day.  No icing.  MRI L knee by Dr. Tamala Julian in 2008:  Mild fraying of posterior horn medial meniscus; mild degenerative changes.  S/p arthroscopic surgery R knee by Tamala Julian.  L foot pain: onset in past two weeks; occurs intermittently; sharp pain along lateral foot.  No swelling.  No limping.No icing.  Can occur anytime. Can occur with barefooted.     Takes Meloxicam every day; was taking Celebrex daily and then switched.  Was taking Ibuprofen before traveling to [place].  Started taking Ibuprofen when was taking parents back and forth to [place].  Requested Ibuprofen refill.     Review of Systems  Musculoskeletal: Positive for joint swelling, arthralgias and gait problem.  Neurological: Negative for weakness and numbness.    Past Medical History  Diagnosis Date  . Anxiety   . Depression   . Hyperlipidemia   . Hypertension   . Degeneration of lumbar or lumbosacral intervertebral disc   . Other dyspnea and respiratory abnormality   . Other malaise and fatigue   . Benign neoplasm of colon   . Basal cell carcinoma   . Seborrheic dermatitis, unspecified   . Coronary atherosclerosis of native coronary artery   . Obesity, unspecified   . Tobacco use disorder   . Esophageal reflux   . Impaired glucose tolerance test   . Internal hemorrhoids without mention of complication   . Diverticulosis of colon (without mention of hemorrhage)   . Unspecified disorder of kidney and ureter   . Macular degeneration   . Pancreatitis due to biliary obstruction 08/05/2012    two week admission in [place].  Step down unit.   Past  Surgical History  Procedure Laterality Date  . Cardiac catheterization  2006    80% blockage  . Dilatation and curettage  x 2  . Tubal ligation  1982  . Knee surgery      Right  . Eye lid surgery  2012  . Laser therapy and injections for macular degeneration    . Cholecystectomy    . Admission  08/05/2012    Acute pancreatitis. [place]. Two week admission; step down unit.  . Eye surgery      per patient's health survey - [facility]AS[facility]   Allergies  Allergen Reactions  . Augmentin [Amoxicillin-Pot Clavulanate] Hives    Social History   Social History  . Marital Status: Married    Spouse Name: N/A  . Number of Children: 2  . Years of Education: 12   Occupational History  . substitute school teacher    Social History Main Topics  . Smoking status: Former Smoker -- 0.50 packs/day for 40 years    Types: Cigarettes    Quit date: 02/18/1990  . Smokeless tobacco: Not on file     Comment: quit 1991  . Alcohol Use: No     Comment: occasional twice a month;  per patient health survery form - No  . Drug Use: No  . Sexual Activity: Not Currently     Comment: due to atrophic vaginitis   Other Topics  Concern  . Not on file   Social History Narrative   Marital status: married x 50 yrs, moderately happily married, no abuse, moved from [place] in 2007.  Husband with infidelity in past and in 2013.      Children:2 children; 4 grandchildren;  live in [place]      Lives: with husband.      Employment: unemployed.      Tobacco: none       Alcohol:  None       Drugs: none      Exercise: Minimal      Caffeine use: coffee 1 serving/day.      Always uses seat belts, smoke alarm in the home, no guns in the home.      ADLs: independent; drives.      Living Will:  FULL CODE; no prolonged measures. Has living will.     Family History  Problem Relation Age of Onset  . Cancer Mother   . Heart disease Mother       CAD     has pacemaker  . Dementia Mother   . Diabetes Mother   . Macular  degeneration Mother   . Heart disease Father   . Rheumatic fever Father   . Heart disease Sister 53    CABG  . Hyperlipidemia Sister   . Stroke Sister 10    TIA's  . Heart disease Brother     CABG age 86  . COPD Brother   . Heart disease Maternal Grandfather   . Hyperlipidemia Sister   . Hypertension Sister   . Arthritis Sister     DDD lumbar s/p surgery  . Parkinson's disease Maternal Grandmother   . Heart disease Paternal Grandmother        Objective:    BP 122/74 mmHg  Pulse 60  Temp(Src) 98.6 F (37 C) (Oral)  Resp 16  Ht 5\' 7"  (1.702 m)  Wt 257 lb 3.2 oz (116.665 kg)  BMI 40.27 kg/m2  SpO2 97% Physical Exam  Constitutional: She is oriented to person, place, and time. She appears well-developed and well-nourished. No distress.  HENT:  Head: Normocephalic and atraumatic.  Eyes: Conjunctivae are normal. Pupils are equal, round, and reactive to light.  Neck: Normal range of motion. Neck supple.  Cardiovascular: Normal rate, regular rhythm and normal heart sounds.  Exam reveals no gallop and no friction rub.   No murmur heard. Pulmonary/Chest: Effort normal and breath sounds normal. She has no wheezes. She has no rales.  Musculoskeletal:       Left knee: She exhibits swelling. She exhibits normal range of motion and no bony tenderness. Tenderness found. Medial joint line tenderness noted. No lateral joint line and no patellar tendon tenderness noted.       Left ankle: Normal. She exhibits normal range of motion. No tenderness.       Left lower leg: Normal. She exhibits no tenderness and no bony tenderness.       Left foot: Normal. There is normal range of motion, no tenderness, no bony tenderness, no swelling and normal capillary refill.  Neurological: She is alert and oriented to person, place, and time.  Skin: She is not diaphoretic.  Psychiatric: She has a normal mood and affect. Her behavior is normal.  Nursing note and vitals reviewed.  Dg Knee Complete 4 Views  Left  09/26/2015  CLINICAL DATA:  Left knee medial pain EXAM: LEFT KNEE - COMPLETE 4+ VIEW COMPARISON:  12/29/2006 FINDINGS: Four views  of the left knee submitted. No acute fracture or subluxation. Mild narrowing of medial joint compartment. Mild spurring of medial femoral condyle and medial tibial plateau. No joint effusion. Mild narrowing of patellofemoral joint space. IMPRESSION: No acute fracture or subluxation. Osteoarthritic changes as described above. Electronically Signed   By: Lahoma Crocker M.D.   On: 09/26/2015 10:51   Dg Foot Complete Left  09/26/2015  CLINICAL DATA:  Left foot pain EXAM: LEFT FOOT - COMPLETE 3+ VIEW COMPARISON:  None. FINDINGS: Three views of the left foot submitted. No acute fracture or subluxation. There is plantar spur of calcaneus. Small posterior spur of calcaneus at Achilles tendon insertion. Mild spurring at the base of fifth metatarsal. IMPRESSION: No acute fracture or subluxation. Plantar and posterior spurring of calcaneus. Electronically Signed   By: Lahoma Crocker M.D.   On: 09/26/2015 10:42        Assessment & Plan:   1. Sprain and strain of medial collateral ligament of knee, left, initial encounter   2. Strain of left foot, initial encounter   3. Left medial knee pain   4. Left foot pain     Orders Placed This Encounter  Procedures  . DG Knee Complete 4 Views Left    Standing Status: Future     Number of Occurrences: 1     Standing Expiration Date: 09/25/2016    Order Specific Question:  Reason for Exam (SYMPTOM  OR DIAGNOSIS REQUIRED)    Answer:  L lateral foot pain    Order Specific Question:  Preferred imaging location?    Answer:  External  . DG Foot Complete Left    Standing Status: Future     Number of Occurrences: 1     Standing Expiration Date: 09/25/2016    Order Specific Question:  Reason for Exam (SYMPTOM  OR DIAGNOSIS REQUIRED)    Answer:  L lateral foot pain    Order Specific Question:  Preferred imaging location?    Answer:  External     Meds ordered this encounter  Medications  . diclofenac sodium (VOLTAREN) 1 % GEL    Sig: Apply 2 g topically 4 (four) times daily.    Dispense:  100 g    Refill:  1  . ibuprofen (ADVIL,MOTRIN) 800 MG tablet    Sig: Take 1 tablet (800 mg total) by mouth every 8 (eight) hours as needed.    Dispense:  90 tablet    Refill:  0    No Follow-up on file.    Kristi Elayne Guerin, M.D. [facility] [address] [place], [place]  [address] [phone] phone [phone] fax

## 2015-09-26 NOTE — Patient Instructions (Addendum)
1.  Stop using Meloxicam on a daily basis; switch to using as needed for joint pains. 2.  Apply Diclofenac gell four times daily for two weeks. 3.  Apply ice to LEFT knee twice daily for two weeks for 15 minutes. 4.  Use Ibuprofen 800mg  three times daily as needed; do not use daily.    IF you received an x-ray today, you will receive an invoice from [facility]. Please contact [facility] at [phone] with questions or concerns regarding your invoice.   IF you received labwork today, you will receive an invoice from [employer]. Please contact [facility] at [phone] with questions or concerns regarding your invoice.   Our billing staff will not be able to assist you with questions regarding bills from these companies.  You will be contacted with the lab results as soon as they are available. The fastest way to get your results is to activate your My Chart account. Instructions are located on the last page of this paperwork. If you have not heard from [place] regarding the results in 2 weeks, please contact this office.     We recommend that you schedule a mammogram for breast cancer screening. Typically, you do not need a referral to do this. Please contact a local imaging center to schedule your mammogram.  [facility] - [phone]  *ask for the Radiology [place] ([place]) - [phone] or [phone]  [facility] - [phone] [place] [phone] [facility] - [phone]  *ask for the [facility] - [phone]  *ask for the Radiology Department [facility] - [phone]  *ask for the Mammography Department [facility] - [phone]  Medial Collateral Knee Ligament Sprain With Phase I Rehab The medial collateral ligament (MCL) of the knee helps hold the knee joint in proper alignment and  prevents the bones from shifting out of alignment (displacing) to the inside (medially). Injury to the knee may cause a tear in the MCL ligament (sprain). Sprains may heal without treatment, but this often results in a loose joint. Sprains are classified into three categories. Grade 1 sprains cause pain, but the tendon is not lengthened. Grade 2 sprains include a lengthened ligament, due to the ligament being stretched or partially ruptured. With grade 2 sprains, there is still function, although possibly decreased. Grade 3 sprains involve a complete tear of the tendon or muscle, and function is usually impaired. SYMPTOMS   Pain and tenderness on the inner side of the knee.  A "pop," tearing or pulling sensation at the time of injury.  Bruising (contusion) at the site of injury, within 48 hours of injury.  Knee stiffness.  Limping, often walking with the knee bent. CAUSES  An MCL sprain occurs when a force is placed on the ligament that is greater than it can handle. Common mechanisms of injury include:  Direct hit (trauma) to the outer side of the knee, especially if the foot is planted on the ground.  Forceful pivoting of the body and leg, while the foot is planted on the ground. RISK INCREASES WITH:  Contact sports (football, rugby).  Sports that require pivoting or cutting (soccer).  Poor knee strength and flexibility.  Improper equipment use. PREVENTION  Warm up and stretch properly before activity.  Maintain physical fitness:  Strength, flexibility and endurance.  Cardiovascular fitness.  Wear properly fitted protective equipment (correct length of cleats for  surface).  Functional braces may be effective in preventing injury. PROGNOSIS  MCL tears usually heal without the need for surgery. Sometimes however, surgery is required. RELATED COMPLICATIONS  Frequently recurring symptoms, such as the knee giving way, knee instability or knee swelling.  Injury to other  structures in the knee joint:  Meniscal cartilage, resulting in locking and swelling of the knee.  Articular cartilage, resulting in knee arthritis.  Other ligaments of the knee.  Injury to nerves, resulting in numbness of the outer leg, foot or ankle and weakness or paralysis, with inability to raise the ankle or toes.  Knee stiffness. TREATMENT Treatment first involves the use of ice and medicine, to reduce pain and inflammation. The use of strengthening and stretching exercises may help reduce pain with activity. These exercises may be performed at home, but referral to a therapist is often advised. You may be advised to walk with crutches until you are able to walk without a limp. Your caregiver may provide you with a hinged knee brace to help regain a full range of motion, while also protecting the injured knee. For severe MCL injuries or injuries that involve other ligaments of the knee, surgery is often advised. MEDICATION  Do not take pain medicine for 7 days before surgery.  Only use over-the-counter pain medicine as directed by your caregiver.  Only use prescription pain relievers as directed and only in needed amounts. HEAT AND COLD  Cold treatment (icing) should be applied for 10 to 15 minutes every 2 to 3 hours for inflammation and pain, and immediately after any activity, that aggravates the symptoms. Use ice packs or an ice massage.  Heat treatment may be used before performing stretching and strengthening activities prescribed by your caregiver, physical therapist or athletic trainer. Use a heat Orla Jolliff or warm water soak. SEEK MEDICAL CARE IF:   Symptoms get worse or do not improve in 4 to 6 weeks, despite treatment.  New, unexplained symptoms develop. EXERCISES  PHASE I EXERCISES  RANGE OF MOTION (ROM) AND STRETCHING EXERCISES-Medial Collateral Knee Ligament Sprain Phase I These are some of the initial exercises that your physician, physical therapist or athletic  trainer may have you perform to begin your rehabilitation. When you demonstrate gains in your flexibility and strength, your caregiver may progress you to Phase II exercises. As you perform these exercises, remember:  These initial exercises are intended to be gentle. They will help you restore motion without increasing any swelling.  Completing these exercises allows less painful movement and prepares you for the more aggressive strengthening exercises in Phase II.  An effective stretch should be held for at least 30 seconds.  A stretch should never be painful. You should only feel a gentle lengthening or release in the stretched tissue. RANGE OF MOTION-Knee Flexion, Active  Lie on your back with both knees straight. (If this causes back discomfort, bend your healthy knee, placing your foot flat on the floor.)  Slowly slide your heel back toward your buttocks until you feel a gentle stretch in the front of your knee or thigh.  Hold for __________ seconds. Slowly slide your heel back to the starting position. Repeat __________ times. Complete this exercise __________ times per day. STRETCH-Knee Flexion, Supine  Lie on the floor with your right / left heel and foot lightly touching the wall. (Place both feet on the wall if you do not use a door frame.)  Without using any effort, allow gravity to slide your foot down the wall  slowly until you feel a gentle stretch in the front of your right / left knee.  Hold this stretch for __________ seconds. Then return the leg to the starting position, using your health leg for help, if needed. Repeat __________ times. Complete this stretch __________ times per day. RANGE OF MOTION-Knee Flexion and Extension, Active-Assisted  Sit on the edge of a table or chair with your thighs firmly supported. It may be helpful to place a folded towel under the end of your right / left thigh.  Flexion (bending): Place the ankle of your healthy leg on top of the  other ankle. Use your healthy leg to gently bend your right / left knee until you feel a mild tension across the top of your knee.  Hold for __________ seconds.  Extension (straightening): Switch your ankles so your right / left leg is on top. Use your healthy leg to straighten your right / left knee until you feel a mild tension on the backside of your knee.  Hold for __________ seconds. Repeat __________ times. Complete this exercise __________ times per day. STRETCH-Knee Extension Sitting  Sit with your right / left leg/heel propped on another chair, coffee table, or foot stool.  Allow your leg muscles to relax, letting gravity straighten out your knee.*  You should feel a stretch behind your right / left knee. Hold this position for __________ seconds. Repeat __________ times. Complete this stretch __________ times per day. *Your physician, physical therapist or athletic trainer may instruct you to place a __________ weight on your thigh, just above your kneecap, to deepen the stretch. STRENGTHENING EXERCISES-Medial Collateral Knee Ligament Sprain Phase I These exercises may help you when beginning to rehabilitate your injury. They may resolve your symptoms with or without further involvement from your physician, physical therapist or athletic trainer. While completing these exercises, remember:   In order to return to more demanding activities, you will likely need to progress to more challenging exercises. Your physician, physical therapist or athletic trainer will advance your exercises when your tissues show adequate healing and your muscles demonstrate increased strength.  Muscles can gain both the endurance and the strength needed for everyday activities through controlled exercises.  Complete these exercises as instructed by your physician, physical therapist or athletic trainer. Increase the resistance and repetitions only as guided by your caregiver. STRENGTH-Quadriceps,  Isometrics  Lie on your back with your right / left leg extended and your opposite knee bent.  Gradually tense the muscles in the front of your right / left thigh. You should see either your kneecap slide up toward your hip or an increased dimpling just above the knee. This motion will push the back of the knee down toward the floor, mat or bed on which you are lying.  Hold the muscle as tight as you can without increasing your pain for __________ seconds.  Relax the muscles slowly and completely in between each repetition. Repeat __________ times. Complete this exercise __________ times per day. STRENGTH-Quadriceps, Short Arcs  Lie on your back. Place a __________ inch towel roll under your knee so that the knee slightly bends.  Raise only your lower leg by tightening the muscles in the front of your thigh. Do not allow your thigh to rise.  Hold this position for __________ seconds. Repeat __________ times. Complete this exercise __________ times per day. OPTIONAL ANKLE WEIGHTS: Begin with ____________________, but DO NOT exceed ____________________. Increase in 1 pound/0.5 kilogram increments.  STRENGTH--Quadriceps, Straight Leg Raises Quality counts!  Watch for signs that the quadriceps muscle is working, to be sure you are strengthening the correct muscles and not "cheating" by substituting with healthier muscles.  Lay on your back with your right / left leg extended and your opposite knee bent.  Tense the muscles in the front of your right / left thigh. You should see either your knee cap slide up or increased dimpling just above the knee. Your thigh may even shake a bit.  Tighten these muscles even more and raise your leg 4 to 6 inches off the floor. Hold for __________ seconds.  Keeping these muscles tense, lower your leg.  Relax the muscles slowly and completely in between each repetition. Repeat __________ times. Complete this exercise __________ times per  day. STRENGTH-Hamstring, Isometrics  Lie on your back on a firm surface.  Bend your right / left knee approximately __________ degrees.  Dig your heel into the surface as if you are trying to pull it toward your buttocks. Tighten the muscles in the back of your thighs to "dig" as hard as you can, without increasing any pain.  Hold this position for __________ seconds.  Release the tension gradually and allow your muscle to completely relax for __________ seconds in between each exercise. Repeat __________ times. Complete this exercise __________ times per day. STRENGTH-Hamstring, Curls  Lay on your stomach with your legs extended. (If you lay on a bed, your feet may hang over the edge.)  Tighten the muscles in the back of your thigh to bend your right / left knee up to 90 degrees. Keep your hips flat on the bed.  Hold this position for __________ seconds.  Slowly lower your leg back to the starting position. Repeat __________ times. Complete this exercise __________ times per day. OPTIONAL ANKLE WEIGHTS: Begin with ____________________, but DO NOT exceed ____________________. Increase in 1 pound/0.5 kilogram increments.    This information is not intended to replace advice given to you by your health care provider. Make sure you discuss any questions you have with your health care provider.   Document Released: 03/24/2005 Document Revised: 04/14/2014 Document Reviewed: 07/06/2008 Elsevier Interactive Patient Education [employer].

## 2015-09-29 ENCOUNTER — Other Ambulatory Visit: Payer: Self-pay | Admitting: Family Medicine

## 2015-10-06 ENCOUNTER — Telehealth: Payer: Self-pay

## 2015-10-06 ENCOUNTER — Other Ambulatory Visit: Payer: Self-pay

## 2015-10-06 NOTE — Telephone Encounter (Signed)
Msg is for Gail Burns, Pt needs a refill on Nexum, [facility] is suppose to send over a fax. She will also need it sent to  [facility] on [address] in [place] for pick up. Pt states she only has one for today and tomorrow, she will be completely out on Monday.  Please advise  [phone]

## 2015-10-06 NOTE — Telephone Encounter (Signed)
Refill req for 90 days per [facility]:  Esomeprazole Mag DR caps 40mg - pt has appt 10/23/15 Your prior msg said: refill all meds to July.

## 2015-10-08 ENCOUNTER — Telehealth: Payer: Self-pay

## 2015-10-08 MED ORDER — ESOMEPRAZOLE MAGNESIUM 40 MG PO CPDR: DELAYED_RELEASE_CAPSULE | ORAL | Status: DC

## 2015-10-08 NOTE — Telephone Encounter (Signed)
Pt is needing to talk with someone about a refill on nexium and also needs a small amount to get her thru until the mail order gets it filled -she needs both mail order and [facility] in [place] number

## 2015-10-08 NOTE — Telephone Encounter (Signed)
I sent in RF to [facility] per Dr Thompson Caul prev note and since she has appt this month.

## 2015-10-08 NOTE — Telephone Encounter (Signed)
I RFd the 90 day to [facility] already. Called pt to advise and sent in #10 locally per her request.

## 2015-10-08 NOTE — Telephone Encounter (Signed)
Duplicate message. 

## 2015-10-23 ENCOUNTER — Ambulatory Visit (INDEPENDENT_AMBULATORY_CARE_PROVIDER_SITE_OTHER): Payer: Medicare Other | Admitting: Family Medicine

## 2015-10-23 ENCOUNTER — Encounter: Payer: Self-pay | Admitting: Family Medicine

## 2015-10-23 VITALS — BP 124/78 | HR 63 | Temp 98.1°F | Resp 18 | Ht 67.0 in | Wt 256.6 lb

## 2015-10-23 DIAGNOSIS — I10 Essential (primary) hypertension: Secondary | ICD-10-CM | POA: Diagnosis not present

## 2015-10-23 DIAGNOSIS — R7309 Other abnormal glucose: Secondary | ICD-10-CM | POA: Diagnosis not present

## 2015-10-23 DIAGNOSIS — F329 Major depressive disorder, single episode, unspecified: Secondary | ICD-10-CM | POA: Diagnosis not present

## 2015-10-23 DIAGNOSIS — I2510 Atherosclerotic heart disease of native coronary artery without angina pectoris: Secondary | ICD-10-CM | POA: Diagnosis not present

## 2015-10-23 DIAGNOSIS — Z86010 Personal history of colonic polyps: Secondary | ICD-10-CM

## 2015-10-23 DIAGNOSIS — E7800 Pure hypercholesterolemia, unspecified: Secondary | ICD-10-CM

## 2015-10-23 DIAGNOSIS — Z1159 Encounter for screening for other viral diseases: Secondary | ICD-10-CM

## 2015-10-23 DIAGNOSIS — M25562 Pain in left knee: Secondary | ICD-10-CM

## 2015-10-23 DIAGNOSIS — F32A Depression, unspecified: Secondary | ICD-10-CM

## 2015-10-23 DIAGNOSIS — Z0000 Encounter for general adult medical examination without abnormal findings: Secondary | ICD-10-CM

## 2015-10-23 LAB — CBC WITH DIFFERENTIAL/PLATELET
BASOS ABS: 0 {cells}/uL (ref 0–200)
Basophils Relative: 0 %
EOS ABS: 231 {cells}/uL (ref 15–500)
EOS PCT: 3 %
HCT: 39 % (ref 35.0–45.0)
Hemoglobin: 13 g/dL (ref 11.7–15.5)
LYMPHS ABS: 2079 {cells}/uL (ref 850–3900)
LYMPHS PCT: 27 %
MCH: 29.7 pg (ref 27.0–33.0)
MCHC: 33.3 g/dL (ref 32.0–36.0)
MCV: 89.2 fL (ref 80.0–100.0)
MONO ABS: 693 {cells}/uL (ref 200–950)
MONOS PCT: 9 %
MPV: 9.4 fL (ref 7.5–12.5)
NEUTROS PCT: 61 %
Neutro Abs: 4697 cells/uL (ref 1500–7800)
PLATELETS: 329 10*3/uL (ref 140–400)
RBC: 4.37 MIL/uL (ref 3.80–5.10)
RDW: 14 % (ref 11.0–15.0)
WBC: 7.7 10*3/uL (ref 3.8–10.8)

## 2015-10-23 LAB — POCT URINALYSIS DIP (MANUAL ENTRY)
BILIRUBIN UA: NEGATIVE
BILIRUBIN UA: NEGATIVE
GLUCOSE UA: NEGATIVE
Nitrite, UA: NEGATIVE
PH UA: 6
Protein Ur, POC: NEGATIVE
Spec Grav, UA: 1.025
Urobilinogen, UA: 0.2

## 2015-10-23 LAB — POC MICROSCOPIC URINALYSIS (UMFC): Mucus: ABSENT

## 2015-10-23 NOTE — Patient Instructions (Addendum)
   IF you received an x-ray today, you will receive an invoice from [facility]. Please contact [facility] at [phone] with questions or concerns regarding your invoice.   IF you received labwork today, you will receive an invoice from [facility]. Please contact [facility] at [phone] with questions or concerns regarding your invoice.   Our billing staff will not be able to assist you with questions regarding bills from these companies.  You will be contacted with the lab results as soon as they are available. The fastest way to get your results is to activate your My Chart account. Instructions are located on the last page of this paperwork. If you have not heard from us regarding the results in 2 weeks, please contact this office.    Keeping You Healthy  Get These Tests  Blood Pressure- Have your blood pressure checked by your healthcare provider at least once a year.  Normal blood pressure is 120/80.  Weight- Have your body mass index (BMI) calculated to screen for obesity.  BMI is a measure of body fat based on height and weight.  You can calculate your own BMI at www.nhlbisupport.com/bmi/  Cholesterol- Have your cholesterol checked every year.  Diabetes- Have your blood sugar checked every year if you have high blood pressure, high cholesterol, a family history of diabetes or if you are overweight.  Pap Test - Have a pap test every 1 to 5 years if you have been sexually active.  If you are older than 65 and recent pap tests have been normal you may not need additional pap tests.  In addition, if you have had a hysterectomy  for benign disease additional pap tests are not necessary.  Mammogram-Yearly mammograms are essential for early detection of breast cancer  Screening for Colon Cancer- Colonoscopy starting at age 50. Screening may begin sooner depending on your family history and other health conditions.  Follow up colonoscopy  as directed by your Gastroenterologist.  Screening for Osteoporosis- Screening begins at age 65 with bone density scanning, sooner if you are at higher risk for developing Osteoporosis.  Get these medicines  Calcium with Vitamin D- Your body requires 1200-1500 mg of Calcium a day and 800-1000 IU of Vitamin D a day.  You can only absorb 500 mg of Calcium at a time therefore Calcium must be taken in 2 or 3 separate doses throughout the day.  Hormones- Hormone therapy has been associated with increased risk for certain cancers and heart disease.  Talk to your healthcare provider about if you need relief from menopausal symptoms.  Aspirin- Ask your healthcare provider about taking Aspirin to prevent Heart Disease and Stroke.  Get these Immuniztions  Flu shot- Every fall  Pneumonia shot- Once after the age of 65; if you are younger ask your healthcare provider if you need a pneumonia shot.  Tetanus- Every ten years.  Zostavax- Once after the age of 60 to prevent shingles.  Take these steps  Don't smoke- Your healthcare provider can help you quit. For tips on how to quit, ask your healthcare provider or go to www.smokefree.gov or call 1-800 QUIT-NOW.  Be physically active- Exercise 5 days a week for a minimum of 30 minutes.  If you are not already physically active, start slow and gradually work up to 30 minutes of moderate physical activity.  Try walking, dancing, bike riding, swimming, etc.  Eat a healthy diet- Eat a variety of healthy foods such as fruits, vegetables, whole   grains, low fat milk, low fat cheeses, yogurt, lean meats, chicken, fish, eggs, dried beans, tofu, etc.  For more information go to www.thenutritionsource.org  Dental visit- Brush and floss teeth twice daily; visit your dentist twice a year.  Eye exam- Visit your Optometrist or Ophthalmologist yearly.  Drink alcohol in moderation- Limit alcohol intake to one drink or less a day.  Never drink and  drive.  Depression- Your emotional health is as important as your physical health.  If you're feeling down or losing interest in things you normally enjoy, please talk to your healthcare provider.  Seat Belts- can save your life; always wear one  Smoke/Carbon Monoxide detectors- These detectors need to be installed on the appropriate level of your home.  Replace batteries at least once a year.  Violence- If anyone is threatening or hurting you, please tell your healthcare provider.  Living Will/ Health care power of attorney- Discuss with your healthcare provider and family. 

## 2015-10-23 NOTE — Progress Notes (Signed)
Subjective:    Patient ID: Gail Burns, female    DOB: [birth year], 70 y.o.   MRN: [id number]  10/23/2015  Annual Exam (CPE)   HPI This 70 y.o. female presents for Annual Wellness Examination.    Last physical:  10-26-2013 Pap smear: 09-10-2012 Mammogram: 03-2014 Colonoscopy: 2015 Bone density:  12-26-2014 TDAP:  2009 Pneumovax:  Prevnar 13 2015; Pneumovax 2016 Zostavax: 2012 Influenza:  04/2015 Eye exam: due; +glasses; [employer]. 2015.  Macular Degeneration.  Cous[place].  [place].  Dental exam:  L knee: still having a lot of pain in L knee; did not take Ibuprofen last night.    HTN: Patient reports good compliance with medication, good tolerance to medication, and good symptom control.    Hypercholesterolemia: Patient reports good compliance with medication, good tolerance to medication, and good symptom control.    CAD: followed by cardiology annually; denies CP/palp/SOB/leg swelling.   Review of Systems  Constitutional: Negative for fever, chills, diaphoresis, activity change, appetite change, fatigue and unexpected weight change.  HENT: Negative for congestion, dental problem, drooling, ear discharge, ear pain, facial swelling, hearing loss, mouth sores, nosebleeds, postnasal drip, rhinorrhea, sinus pressure, sneezing, sore throat, tinnitus, trouble swallowing and voice change.   Eyes: Negative for photophobia, pain, discharge, redness, itching and visual disturbance.  Respiratory: Negative for apnea, cough, choking, chest tightness, shortness of breath, wheezing and stridor.   Cardiovascular: Negative for chest pain, palpitations and leg swelling.  Gastrointestinal: Negative for nausea, vomiting, abdominal pain, diarrhea, constipation, blood in stool, abdominal distention, anal bleeding and rectal pain.  Endocrine: Negative for cold intolerance, heat intolerance, polydipsia, polyphagia and polyuria.  Genitourinary: Negative for dysuria, urgency, frequency, hematuria, flank  pain, decreased urine volume, vaginal bleeding, vaginal discharge, enuresis, difficulty urinating, genital sores, vaginal pain, menstrual problem, pelvic pain and dyspareunia.  Musculoskeletal: Negative for myalgias, back pain, joint swelling, arthralgias, gait problem, neck pain and neck stiffness.  Skin: Negative for color change, pallor, rash and wound.  Allergic/Immunologic: Negative for environmental allergies, food allergies and immunocompromised state.  Neurological: Negative for dizziness, tremors, seizures, syncope, facial asymmetry, speech difficulty, weakness, light-headedness, numbness and headaches.  Hematological: Negative for adenopathy. Does not bruise/bleed easily.  Psychiatric/Behavioral: Negative for suicidal ideas, hallucinations, behavioral problems, confusion, sleep disturbance, self-injury, dysphoric mood, decreased concentration and agitation. The patient is not nervous/anxious and is not hyperactive.     Past Medical History:  Diagnosis Date  . Anxiety   . Basal cell carcinoma   . Benign neoplasm of colon   . Coronary atherosclerosis of native coronary artery   . Degeneration of lumbar or lumbosacral intervertebral disc   . Depression   . Diverticulosis of colon (without mention of hemorrhage)   . Esophageal reflux   . Hyperlipidemia   . Hypertension   . Impaired glucose tolerance test   . Internal hemorrhoids without mention of complication   . Macular degeneration   . Obesity, unspecified   . Other dyspnea and respiratory abnormality   . Other malaise and fatigue   . Pancreatitis due to biliary obstruction 08/05/2012   two week admission in [place].  Step down unit.  . Seborrheic dermatitis, unspecified   . Tobacco use disorder   . Unspecified disorder of kidney and ureter    Past Surgical History:  Procedure Laterality Date  . Admission  08/05/2012   Acute pancreatitis. [place]. Two week admission; step down unit.  Marland Kitchen CARDIAC CATHETERIZATION  2006   80%  blockage  . CHOLECYSTECTOMY    .  dilatation and curettage  x 2  . eye lid surgery  2012  . EYE SURGERY     per patient's health survey - LASER  . KNEE SURGERY     Right  . Laser therapy and injections for macular degeneration    . TUBAL LIGATION  1982   Allergies  Allergen Reactions  . Augmentin [Amoxicillin-Pot Clavulanate] Hives   Current Outpatient Prescriptions  Medication Sig Dispense Refill  . aspirin 81 MG tablet Take 81 mg by mouth daily.    . calcium carbonate (OS-CAL) 600 MG TABS Take 600 mg by mouth 2 (two) times daily with a meal.    . diclofenac sodium (VOLTAREN) 1 % GEL Apply 2 g topically 4 (four) times daily. 100 g 1  . escitalopram (LEXAPRO) 20 MG tablet TAKE 1 TABLET DAILY 90 tablet 0  . esomeprazole (NEXIUM) 40 MG capsule TAKE 1 CAPSULE DAILY AT 12 NOON 10 capsule 0  . fenofibrate (TRICOR) 145 MG tablet TAKE 1 TABLET DAILY 90 tablet 0  . ibuprofen (ADVIL,MOTRIN) 800 MG tablet Take 1 tablet (800 mg total) by mouth every 8 (eight) hours as needed. 90 tablet 0  . metoprolol tartrate (LOPRESSOR) 25 MG tablet TAKE 1 TABLET TWICE A DAY 180 tablet 1  . Misc Natural Products (LUTEIN VISION BLEND PO) Take by mouth daily.    . Multiple Vitamin (MULTIVITAMIN) tablet Take 1 tablet by mouth daily.    . rosuvastatin (CRESTOR) 40 MG tablet TAKE 1 TABLET DAILY 90 tablet 0  . meloxicam (MOBIC) 15 MG tablet TAKE 1 TABLET DAILY (NO MORE REFILLS WITHOUT OFFICE VISIT) (Patient not taking: Reported on 10/23/2015) 90 tablet 0   No current facility-administered medications for this visit.    Social History   Social History  . Marital status: Married    Spouse name: N/A  . Number of children: 2  . Years of education: 12   Occupational History  . substitute school teacher    Social History Main Topics  . Smoking status: Former Smoker    Packs/day: 0.50    Years: 40.00    Types: Cigarettes    Quit date: 02/18/1990  . Smokeless tobacco: Not on file     Comment: quit 1991  .  Alcohol use No     Comment: occasional twice a month;  per patient health survery form - No  . Drug use: No  . Sexual activity: Not Currently     Comment: due to atrophic vaginitis   Other Topics Concern  . Not on file   Social History Narrative   Marital status: married x 51 yrs, moderately happily married, no abuse, moved from [place] in 2007.  Husband with infidelity in past and in 2013.      Children:2 children; 4 grandchildren;  live in [place]      Lives: with husband.      Employment: unemployed. Substitute teacher.        Tobacco: none       Alcohol:  None       Drugs: none      Exercise: Minimal      Caffeine use: coffee 1 serving/day.      Always uses seat belts, smoke alarm in the home, no guns in the home.      ADLs: independent; drives.      Living Will:  FULL CODE; no prolonged measures. Has living will.     Family History  Problem Relation Age of Onset  . Cancer Mother   .  Heart disease Mother       CAD     has pacemaker  . Dementia Mother   . Diabetes Mother   . Macular degeneration Mother   . Heart disease Father   . Rheumatic fever Father   . Heart disease Sister 57    CABG  . Hyperlipidemia Sister   . Stroke Sister 33    TIA's  . Heart disease Brother     CABG age 8  . COPD Brother   . Heart disease Maternal Grandfather   . Hyperlipidemia Sister   . Hypertension Sister   . Arthritis Sister     DDD lumbar s/p surgery  . Parkinson's disease Maternal Grandmother   . Heart disease Paternal Grandmother        Objective:    BP 124/78   Pulse 63   Temp 98.1 F (36.7 C) (Oral)   Resp 18   Ht _0  (1.702 m)   Wt 256 lb 9.6 oz (116.4 kg)   SpO2 96%   BMI 40.19 kg/m   Physical Exam  Constitutional: She is oriented to person, place, and time. She appears well-developed and well-nourished. No distress.  HENT:  Head: Normocephalic and atraumatic.  Right Ear: External ear normal.  Left Ear: External ear normal.  Nose: Nose normal.    Mouth/Throat: Oropharynx is clear and moist.  Eyes: Conjunctivae and EOM are normal. Pupils are equal, round, and reactive to light.  Neck: Normal range of motion and full passive range of motion without pain. Neck supple. No JVD present. Carotid bruit is not present. No thyromegaly present.  Cardiovascular: Normal rate, regular rhythm and normal heart sounds.  Exam reveals no gallop and no friction rub.   No murmur heard. Pulmonary/Chest: Effort normal and breath sounds normal. She has no wheezes. She has no rales. Right breast exhibits no inverted nipple, no mass, no nipple discharge, no skin change and no tenderness. Left breast exhibits no inverted nipple, no mass, no nipple discharge, no skin change and no tenderness. Breasts are symmetrical.  Abdominal: Soft. Bowel sounds are normal. She exhibits no distension and no mass. There is no tenderness. There is no rebound and no guarding.  Musculoskeletal:       Right shoulder: Normal.       Left shoulder: Normal.       Cervical back: Normal.  Lymphadenopathy:    She has no cervical adenopathy.  Neurological: She is alert and oriented to person, place, and time. She has normal reflexes. No cranial nerve deficit. She exhibits normal muscle tone. Coordination normal.  Skin: Skin is warm and dry. No rash noted. She is not diaphoretic. No erythema. No pallor.  Psychiatric: She has a normal mood and affect. Her behavior is normal. Judgment and thought content normal.  Nursing note and vitals reviewed.  Results for orders placed or performed in visit on 10/23/15  CBC with Differential/Platelet  Result Value Ref Range   WBC 7.7 3.8 - 10.8 K/uL   RBC 4.37 3.80 - 5.10 MIL/uL   Hemoglobin 13.0 11.7 - 15.5 g/dL   HCT 39.0 35.0 - 45.0 %   MCV 89.2 80.0 - 100.0 fL   MCH 29.7 27.0 - 33.0 pg   MCHC 33.3 32.0 - 36.0 g/dL   RDW 14.0 11.0 - 15.0 %   Platelets 329 140 - 400 K/uL   MPV 9.4 7.5 - 12.5 fL   Neutro Abs 4,697 1,500 - 7,800 cells/uL    Lymphs Abs 2,079  850 - 3,900 cells/uL   Monocytes Absolute 693 200 - 950 cells/uL   Eosinophils Absolute 231 15 - 500 cells/uL   Basophils Absolute 0 0 - 200 cells/uL   Neutrophils Relative % 61 %   Lymphocytes Relative 27 %   Monocytes Relative 9 %   Eosinophils Relative 3 %   Basophils Relative 0 %   Smear Review Criteria for review not met   Comprehensive metabolic panel  Result Value Ref Range   Sodium 140 135 - 146 mmol/L   Potassium 4.1 3.5 - 5.3 mmol/L   Chloride 103 98 - 110 mmol/L   CO2 26 20 - 31 mmol/L   Glucose, Bld 95 65 - 99 mg/dL   BUN 19 7 - 25 mg/dL   Creat 1.23 (H) 0.60 - 0.93 mg/dL   Total Bilirubin 0.7 0.2 - 1.2 mg/dL   Alkaline Phosphatase 55 33 - 130 U/L   AST 20 10 - 35 U/L   ALT 14 6 - 29 U/L   Total Protein 6.9 6.1 - 8.1 g/dL   Albumin 4.4 3.6 - 5.1 g/dL   Calcium 9.6 8.6 - 10.4 mg/dL  Hemoglobin A1c  Result Value Ref Range   Hgb A1c MFr Bld 6.2 (H) <5.7 %   Mean Plasma Glucose 131 mg/dL  Lipid panel  Result Value Ref Range   Cholesterol 193 125 - 200 mg/dL   Triglycerides 192 (H) <150 mg/dL   HDL 51 >=46 mg/dL   Total CHOL/HDL Ratio 3.8 <=5.0 Ratio   VLDL 38 (H) <30 mg/dL   LDL Cholesterol 104 <130 mg/dL  Hepatitis C antibody  Result Value Ref Range   HCV Ab NEGATIVE NEGATIVE  Microalbumin, urine  Result Value Ref Range   Microalb, Ur 0.9 Not estab mg/dL  POCT urinalysis dipstick  Result Value Ref Range   Color, UA yellow yellow   Clarity, UA clear clear   Glucose, UA negative negative   Bilirubin, UA negative negative   Ketones, POC UA negative negative   Spec Grav, UA 1.025    Blood, UA trace-intact (A) negative   pH, UA 6.0    Protein Ur, POC negative negative   Urobilinogen, UA 0.2    Nitrite, UA Negative Negative   Leukocytes, UA moderate (2+) (A) Negative  POCT Microscopic Urinalysis ([facility])  Result Value Ref Range   WBC,UR,HPF,POC Moderate (A) None WBC/hpf   RBC,UR,HPF,POC None None RBC/hpf   Bacteria None None, Too  numerous to count   Mucus Absent Absent   Epithelial Cells, UR Per Microscopy Few (A) None, Too numerous to count cells/hpf   Depression screen [facility] 2/9 10/23/2015 09/26/2015 09/26/2015 04/24/2015 10/25/2014  Decreased Interest 0 0 0 0 0  Down, Depressed, Hopeless 0 0 0 0 0  PHQ - 2 Score 0 0 0 0 0   Fall Risk  10/23/2015 09/26/2015 09/26/2015 10/25/2014 10/26/2013  Falls in the past year? No No No Yes No  Functional Status Survey: Is the patient deaf or have difficulty hearing?: No Does the patient have difficulty seeing, even when wearing glasses/contacts?: No Does the patient have difficulty concentrating, remembering, or making decisions?: No Does the patient have difficulty walking or climbing stairs?: No Does the patient have difficulty dressing or bathing?: No Does the patient have difficulty doing errands alone such as visiting a doctor's office or shopping?: No     Assessment & Plan:   1. Encounter for Medicare annual wellness exam   2. Depression   3. Coronary artery  disease involving native coronary artery of native heart without angina pectoris   4. Pure hypercholesterolemia   5. Other abnormal glucose   6. Essential hypertension, benign   7. Personal history of colonic polyps   8. Need for hepatitis C screening test   9. Left knee pain     Orders Placed This Encounter  Procedures  . CBC with Differential/Platelet  . Comprehensive metabolic panel    Order Specific Question:   Has the patient fasted?    Answer:   Yes  . Hemoglobin A1c  . Lipid panel    Order Specific Question:   Has the patient fasted?    Answer:   Yes  . Hepatitis C antibody  . Microalbumin, urine  . Ambulatory referral to Orthopedic Surgery    Referral Priority:   Routine    Referral Type:   Surgical    Referral Reason:   Specialty Services Required    Requested Specialty:   Orthopedic Surgery    Number of Visits Requested:   1  . POCT urinalysis dipstick  . POCT Microscopic Urinalysis ([facility])    No orders of the defined types were placed in this encounter.   Return in about 6 months (around 04/24/2016) for recheck HIGH BLOOD PRESSURE, HIGH CHOLESTEROL .    Jivan Symanski Elayne Guerin, M.D. [facility] [address] [place], [place]  [address] [phone] phone [phone] fax

## 2015-10-24 LAB — LIPID PANEL
CHOL/HDL RATIO: 3.8 ratio (ref <=5.0)
Cholesterol: 193 mg/dL (ref 125–200)
HDL: 51 mg/dL (ref >=46)
LDL CALC: 104 mg/dL (ref <130)
TRIGLYCERIDES: 192 mg/dL — AB (ref <150)
VLDL: 38 mg/dL — ABNORMAL HIGH (ref <30)

## 2015-10-24 LAB — COMPREHENSIVE METABOLIC PANEL
ALT: 14 U/L (ref 6–29)
AST: 20 U/L (ref 10–35)
Albumin: 4.4 g/dL (ref 3.6–5.1)
Alkaline Phosphatase: 55 U/L (ref 33–130)
BUN: 19 mg/dL (ref 7–25)
CHLORIDE: 103 mmol/L (ref 98–110)
CO2: 26 mmol/L (ref 20–31)
CREATININE: 1.23 mg/dL — AB (ref 0.60–0.93)
Calcium: 9.6 mg/dL (ref 8.6–10.4)
Glucose, Bld: 95 mg/dL (ref 65–99)
POTASSIUM: 4.1 mmol/L (ref 3.5–5.3)
SODIUM: 140 mmol/L (ref 135–146)
TOTAL PROTEIN: 6.9 g/dL (ref 6.1–8.1)
Total Bilirubin: 0.7 mg/dL (ref 0.2–1.2)

## 2015-10-24 LAB — HEMOGLOBIN A1C
HEMOGLOBIN A1C: 6.2 % — AB (ref <5.7)
Mean Plasma Glucose: 131 mg/dL

## 2015-10-24 LAB — MICROALBUMIN, URINE: MICROALB UR: 0.9 mg/dL

## 2015-10-24 LAB — HEPATITIS C ANTIBODY: HCV AB: NEGATIVE

## 2015-11-02 DIAGNOSIS — M1712 Unilateral primary osteoarthritis, left knee: Secondary | ICD-10-CM | POA: Diagnosis not present

## 2015-12-14 ENCOUNTER — Encounter: Payer: Self-pay | Admitting: Family Medicine

## 2015-12-17 DIAGNOSIS — H353212 Exudative age-related macular degeneration, right eye, with inactive choroidal neovascularization: Secondary | ICD-10-CM | POA: Diagnosis not present

## 2015-12-17 DIAGNOSIS — H353122 Nonexudative age-related macular degeneration, left eye, intermediate dry stage: Secondary | ICD-10-CM | POA: Diagnosis not present

## 2015-12-26 DIAGNOSIS — H353130 Nonexudative age-related macular degeneration, bilateral, stage unspecified: Secondary | ICD-10-CM | POA: Diagnosis not present

## 2015-12-26 DIAGNOSIS — H2513 Age-related nuclear cataract, bilateral: Secondary | ICD-10-CM | POA: Diagnosis not present

## 2015-12-28 ENCOUNTER — Other Ambulatory Visit: Payer: Self-pay | Admitting: Family Medicine

## 2015-12-29 ENCOUNTER — Other Ambulatory Visit: Payer: Self-pay | Admitting: Family Medicine

## 2016-01-06 ENCOUNTER — Other Ambulatory Visit: Payer: Self-pay | Admitting: Family Medicine

## 2016-02-13 DIAGNOSIS — I10 Essential (primary) hypertension: Secondary | ICD-10-CM | POA: Diagnosis not present

## 2016-02-13 DIAGNOSIS — I2510 Atherosclerotic heart disease of native coronary artery without angina pectoris: Secondary | ICD-10-CM | POA: Diagnosis not present

## 2016-02-13 DIAGNOSIS — K219 Gastro-esophageal reflux disease without esophagitis: Secondary | ICD-10-CM | POA: Diagnosis not present

## 2016-02-13 DIAGNOSIS — E782 Mixed hyperlipidemia: Secondary | ICD-10-CM | POA: Diagnosis not present

## 2016-02-20 DIAGNOSIS — L820 Inflamed seborrheic keratosis: Secondary | ICD-10-CM | POA: Diagnosis not present

## 2016-02-20 DIAGNOSIS — L821 Other seborrheic keratosis: Secondary | ICD-10-CM | POA: Diagnosis not present

## 2016-02-20 DIAGNOSIS — L988 Other specified disorders of the skin and subcutaneous tissue: Secondary | ICD-10-CM | POA: Diagnosis not present

## 2016-02-20 DIAGNOSIS — L570 Actinic keratosis: Secondary | ICD-10-CM | POA: Diagnosis not present

## 2016-02-20 DIAGNOSIS — L814 Other melanin hyperpigmentation: Secondary | ICD-10-CM | POA: Diagnosis not present

## 2016-03-29 ENCOUNTER — Other Ambulatory Visit: Payer: Self-pay | Admitting: Family Medicine

## 2016-04-07 ENCOUNTER — Other Ambulatory Visit: Payer: Self-pay | Admitting: Family Medicine

## 2016-04-09 ENCOUNTER — Encounter: Payer: Self-pay | Admitting: Family Medicine

## 2016-04-09 ENCOUNTER — Ambulatory Visit (INDEPENDENT_AMBULATORY_CARE_PROVIDER_SITE_OTHER): Payer: Medicare Other | Admitting: Family Medicine

## 2016-04-09 VITALS — BP 160/62 | HR 61 | Temp 98.4°F | Resp 18 | Ht 67.0 in | Wt 258.0 lb

## 2016-04-09 DIAGNOSIS — R7309 Other abnormal glucose: Secondary | ICD-10-CM | POA: Diagnosis not present

## 2016-04-09 DIAGNOSIS — I2510 Atherosclerotic heart disease of native coronary artery without angina pectoris: Secondary | ICD-10-CM | POA: Diagnosis not present

## 2016-04-09 DIAGNOSIS — F329 Major depressive disorder, single episode, unspecified: Secondary | ICD-10-CM

## 2016-04-09 DIAGNOSIS — I10 Essential (primary) hypertension: Secondary | ICD-10-CM | POA: Diagnosis not present

## 2016-04-09 DIAGNOSIS — Z23 Encounter for immunization: Secondary | ICD-10-CM

## 2016-04-09 DIAGNOSIS — Z1231 Encounter for screening mammogram for malignant neoplasm of breast: Secondary | ICD-10-CM | POA: Diagnosis not present

## 2016-04-09 DIAGNOSIS — E7800 Pure hypercholesterolemia, unspecified: Secondary | ICD-10-CM | POA: Diagnosis not present

## 2016-04-09 MED ORDER — METOPROLOL TARTRATE 25 MG PO TABS: 25.0000 mg | ORAL_TABLET | Freq: Two times a day (BID) | ORAL | 1 refills | Status: DC

## 2016-04-09 MED ORDER — ESOMEPRAZOLE MAGNESIUM 40 MG PO CPDR
DELAYED_RELEASE_CAPSULE | ORAL | 3 refills | Status: DC
Start: 2016-04-09 — End: 2016-10-29

## 2016-04-09 MED ORDER — ESCITALOPRAM OXALATE 20 MG PO TABS: ORAL_TABLET | ORAL | 1 refills | Status: DC

## 2016-04-09 MED ORDER — FENOFIBRATE 145 MG PO TABS: ORAL_TABLET | ORAL | 3 refills | Status: DC

## 2016-04-09 MED ORDER — ROSUVASTATIN CALCIUM 40 MG PO TABS: ORAL_TABLET | ORAL | 1 refills | Status: DC

## 2016-04-09 MED ORDER — MELOXICAM 15 MG PO TABS: ORAL_TABLET | ORAL | 1 refills | Status: DC

## 2016-04-09 NOTE — Patient Instructions (Signed)
     IF you received an x-ray today, you will receive an invoice from [facility]. Please contact [facility] at [phone] with questions or concerns regarding your invoice.   IF you received labwork today, you will receive an invoice from [facility]. Please contact [facility] at [phone] with questions or concerns regarding your invoice.   Our billing staff will not be able to assist you with questions regarding bills from these companies.  You will be contacted with the lab results as soon as they are available. The fastest way to get your results is to activate your My Chart account. Instructions are located on the last page of this paperwork. If you have not heard from us regarding the results in 2 weeks, please contact this office.     

## 2016-04-09 NOTE — Progress Notes (Signed)
Subjective:    Patient ID: Gail Burns, female    DOB: 09-Mar-1946, 71 y.o.   MRN: [id number]  04/09/2016  Medication Refill and Follow-up (6 month follow up )   HPI This 71 y.o. female presents for six month follow-up for hypercholesterolemia, hypertension, anxiety/depression, coronary artery disease.  Patient reports good compliance with medication, good tolerance to medication, and good symptom control.  Not checking BP at home; not exercising.  Doing really well emotionally.    Immunization History  Administered Date(s) Administered  . Influenza Split 02/05/2011  . Influenza, Seasonal, Injecte, Preservative Fre 03/16/2012  . Influenza,inj,Quad PF,36+ Mos 12/21/2012, 04/26/2014, 04/24/2015  . Pneumococcal Conjugate-13 10/26/2013  . Pneumococcal Polysaccharide-23 10/25/2014  . Pneumococcal-Unspecified 07/11/2009  . Tdap 08/11/2007  . Zoster 04/07/2010   BP Readings from Last 3 Encounters:  04/09/16 (!) 149/70  10/23/15 124/78  09/26/15 122/74   Wt Readings from Last 3 Encounters:  04/09/16 258 lb (117 kg)  10/23/15 256 lb 9.6 oz (116.4 kg)  09/26/15 257 lb 3.2 oz (116.7 kg)   Leaving for cruise on 04/19/16 for one week; going on [place].   One cruise in life.  Son is charge Therapist, sports at [facility] [employer] ED; had to work on Christmas night; will have masters degree in July 2018.  Had a prior career.  Four children.  Drives a motorcycle.  Very happy.    Loose stools: chronic; worried about liver or bowels.  This has occurred since had gallbladder removed.  Previous Clobetisol after discharge from pancreatitis.   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  Felt horrible; legs felt horrible; stiff; felt 71 years old.  Started taking qod; then had to increase to daily.     Review of Systems  Constitutional: Negative for chills, diaphoresis, fatigue and fever.  Eyes: Negative for visual disturbance.  Respiratory:  Negative for cough and shortness of breath.   Cardiovascular: Negative for chest pain, palpitations and leg swelling.  Gastrointestinal: Positive for diarrhea. Negative for abdominal pain, constipation, nausea and vomiting.  Endocrine: Negative for cold intolerance, heat intolerance, polydipsia, polyphagia and polyuria.  Musculoskeletal: Positive for arthralgias and back pain.  Neurological: Negative for dizziness, tremors, seizures, syncope, facial asymmetry, speech difficulty, weakness, light-headedness, numbness and headaches.  Psychiatric/Behavioral: Negative for dysphoric mood. The patient is not nervous/anxious.     Past Medical History:  Diagnosis Date  . Anxiety   . Basal cell carcinoma   . Benign neoplasm of colon   . Coronary atherosclerosis of native coronary artery   . Degeneration of lumbar or lumbosacral intervertebral disc   . Depression   . Diverticulosis of colon (without mention of hemorrhage)   . Esophageal reflux   . Hyperlipidemia   . Hypertension   . Impaired glucose tolerance test   . Internal hemorrhoids without mention of complication   . Macular degeneration   . Obesity, unspecified   . Other dyspnea and respiratory abnormality   . Other malaise and fatigue   . Pancreatitis due to biliary obstruction 08/05/2012   two week admission in [place].  Step down unit.  . Seborrheic dermatitis, unspecified   . Tobacco use disorder   . Unspecified disorder of kidney and ureter    Past Surgical History:  Procedure Laterality Date  . Admission  08/05/2012   Acute pancreatitis. [place]. Two week admission; step down unit.  Marland Kitchen CARDIAC CATHETERIZATION  2006   80% blockage  . CHOLECYSTECTOMY    .  dilatation and curettage  x 2  . eye lid surgery  2012  . EYE SURGERY     per patient's health survey - LASER  . KNEE SURGERY     Right  . Laser therapy and injections for macular degeneration    . TUBAL LIGATION  1982   Allergies  Allergen Reactions  . Augmentin  [Amoxicillin-Pot Clavulanate] Hives    Social History   Social History  . Marital status: Married    Spouse name: N/A  . Number of children: 2  . Years of education: 12   Occupational History  . substitute school teacher    Social History Main Topics  . Smoking status: Former Smoker    Packs/day: 0.50    Years: 40.00    Types: Cigarettes    Quit date: 02/18/1990  . Smokeless tobacco: Never Used     Comment: quit 1991  . Alcohol use No     Comment: occasional twice a month;  per patient health survery form - No  . Drug use: No  . Sexual activity: Not Currently     Comment: due to atrophic vaginitis   Other Topics Concern  . Not on file   Social History Narrative   Marital status: married x 51 yrs, moderately happily married, no abuse, moved from [place] in 2007.  Husband with infidelity in past and in 2013.      Children:2 children; 4 grandchildren;  live in [place]      Lives: with husband.      Employment: unemployed. Substitute teacher.        Tobacco: none       Alcohol:  None       Drugs: none      Exercise: Minimal      Caffeine use: coffee 1 serving/day.      Always uses seat belts, smoke alarm in the home, no guns in the home.      ADLs: independent; drives.      Living Will:  FULL CODE; no prolonged measures. Has living will.     Family History  Problem Relation Age of Onset  . Cancer Mother   . Heart disease Mother       CAD     has pacemaker  . Dementia Mother   . Diabetes Mother   . Macular degeneration Mother   . Heart disease Father   . Rheumatic fever Father   . Heart disease Sister 45    CABG  . Hyperlipidemia Sister   . Stroke Sister 59    TIA's  . Heart disease Brother     CABG age 25  . COPD Brother   . Heart disease Maternal Grandfather   . Hyperlipidemia Sister   . Hypertension Sister   . Arthritis Sister     DDD lumbar s/p surgery  . Parkinson's disease Maternal Grandmother   . Heart disease Paternal Grandmother          Objective:    BP (!) 149/70 (BP Location: Right Arm, Patient Position: Sitting, Cuff Size: Large)   Pulse 61   Temp 98.4 F (36.9 C) (Oral)   Resp 18   Ht 5\' 7"  (1.702 m)   Wt 258 lb (117 kg)   SpO2 96%   BMI 40.41 kg/m  Physical Exam  Constitutional: She is oriented to person, place, and time. She appears well-developed and well-nourished. No distress.  HENT:  Head: Normocephalic and atraumatic.  Right Ear: External ear normal.  Left  Ear: External ear normal.  Nose: Nose normal.  Mouth/Throat: Oropharynx is clear and moist.  Eyes: Conjunctivae and EOM are normal. Pupils are equal, round, and reactive to light.  Neck: Normal range of motion. Neck supple. Carotid bruit is not present. No thyromegaly present.  Cardiovascular: Normal rate, regular rhythm, normal heart sounds and intact distal pulses.  Exam reveals no gallop and no friction rub.   No murmur heard. Pulmonary/Chest: Effort normal and breath sounds normal. She has no wheezes. She has no rales.  Abdominal: Soft. Bowel sounds are normal. She exhibits no distension and no mass. There is no tenderness. There is no rebound and no guarding.  Lymphadenopathy:    She has no cervical adenopathy.  Neurological: She is alert and oriented to person, place, and time. No cranial nerve deficit.  Skin: Skin is warm and dry. No rash noted. She is not diaphoretic. No erythema. No pallor.  Psychiatric: She has a normal mood and affect. Her behavior is normal.        Assessment & Plan:   1. Essential hypertension, benign   2. Coronary artery disease involving native coronary artery of native heart without angina pectoris   3. Pure hypercholesterolemia   4. Other abnormal glucose   5. Reactive depression   6. Need for prophylactic vaccination and inoculation against influenza   7. Encounter for screening mammogram for breast cancer    -controlled; obtain labs; refills provided. -discussed risk benefit of daily and chronic use of  COX-2 and/or NSAIDs; pt with very poor quality of life off of COX-2.  Orders Placed This Encounter  Procedures  . MM SCREENING BREAST TOMO BILATERAL    Standing Status:   Future    Standing Expiration Date:   06/10/2017    Order Specific Question:   Reason for Exam (SYMPTOM  OR DIAGNOSIS REQUIRED)    Answer:   annual screening    Order Specific Question:   Preferred imaging location?    Answer:   [facility]  . Flu Vaccine QUAD 36+ mos IM  . CBC with Differential/Platelet  . Comprehensive metabolic panel    Order Specific Question:   Has the patient fasted?    Answer:   Yes  . Lipid panel    Order Specific Question:   Has the patient fasted?    Answer:   Yes  . Hemoglobin A1c  . Care order/instruction:    Please recheck BP.   Meds ordered this encounter  Medications  . rosuvastatin (CRESTOR) 40 MG tablet    Sig: TAKE 1 TABLET DAILY    Dispense:  90 tablet    Refill:  1  . metoprolol tartrate (LOPRESSOR) 25 MG tablet    Sig: Take 1 tablet (25 mg total) by mouth 2 (two) times daily.    Dispense:  180 tablet    Refill:  1  . esomeprazole (NEXIUM) 40 MG capsule    Sig: TAKE 1 CAPSULE DAILY AT 12 NOON    Dispense:  90 capsule    Refill:  3  . fenofibrate (TRICOR) 145 MG tablet    Sig: TAKE 1 TABLET DAILY    Dispense:  90 tablet    Refill:  3  . escitalopram (LEXAPRO) 20 MG tablet    Sig: TAKE 1 TABLET DAILY    Dispense:  90 tablet    Refill:  1  . meloxicam (MOBIC) 15 MG tablet    Sig: TAKE 1 TABLET DAILY    Dispense:  90 tablet  Refill:  1    Return in about 6 months (around 10/07/2016) for complete physical examiniation.   Daemyn Gariepy Elayne Guerin, M.D. [facility] [address] [place], [place]  [address] [phone] phone [phone] fax

## 2016-04-10 LAB — COMPREHENSIVE METABOLIC PANEL
A/G RATIO: 1.6 (ref 1.2–2.2)
ALT: 14 IU/L (ref 0–32)
AST: 21 IU/L (ref 0–40)
Albumin: 4.4 g/dL (ref 3.5–4.8)
Alkaline Phosphatase: 66 IU/L (ref 39–117)
BUN/Creatinine Ratio: 17 (ref 12–28)
BUN: 21 mg/dL (ref 8–27)
Bilirubin Total: 0.5 mg/dL (ref 0.0–1.2)
CALCIUM: 9.6 mg/dL (ref 8.7–10.3)
CO2: 23 mmol/L (ref 18–29)
Chloride: 99 mmol/L (ref 96–106)
Creatinine, Ser: 1.22 mg/dL — ABNORMAL HIGH (ref 0.57–1.00)
GFR calc non Af Amer: 44 mL/min/{1.73_m2} — ABNORMAL LOW (ref >59)
GFR, EST AFRICAN AMERICAN: 51 mL/min/{1.73_m2} — AB (ref >59)
Globulin, Total: 2.7 g/dL (ref 1.5–4.5)
Glucose: 83 mg/dL (ref 65–99)
POTASSIUM: 4.9 mmol/L (ref 3.5–5.2)
Sodium: 140 mmol/L (ref 134–144)
TOTAL PROTEIN: 7.1 g/dL (ref 6.0–8.5)

## 2016-04-10 LAB — CBC WITH DIFFERENTIAL/PLATELET
BASOS: 1 %
Basophils Absolute: 0 10*3/uL (ref 0.0–0.2)
EOS (ABSOLUTE): 0.2 10*3/uL (ref 0.0–0.4)
EOS: 4 %
HEMATOCRIT: 39.5 % (ref 34.0–46.6)
HEMOGLOBIN: 13.3 g/dL (ref 11.1–15.9)
IMMATURE GRANS (ABS): 0 10*3/uL (ref 0.0–0.1)
IMMATURE GRANULOCYTES: 0 %
LYMPHS: 29 %
Lymphocytes Absolute: 1.8 10*3/uL (ref 0.7–3.1)
MCH: 29.4 pg (ref 26.6–33.0)
MCHC: 33.7 g/dL (ref 31.5–35.7)
MCV: 87 fL (ref 79–97)
Monocytes Absolute: 0.6 10*3/uL (ref 0.1–0.9)
Monocytes: 10 %
NEUTROS PCT: 56 %
Neutrophils Absolute: 3.5 10*3/uL (ref 1.4–7.0)
PLATELETS: 352 10*3/uL (ref 150–379)
RBC: 4.52 x10E6/uL (ref 3.77–5.28)
RDW: 14.3 % (ref 12.3–15.4)
WBC: 6.2 10*3/uL (ref 3.4–10.8)

## 2016-04-10 LAB — HEMOGLOBIN A1C
Est. average glucose Bld gHb Est-mCnc: 134 mg/dL
Hgb A1c MFr Bld: 6.3 % — ABNORMAL HIGH (ref 4.8–5.6)

## 2016-04-10 LAB — LIPID PANEL
CHOLESTEROL TOTAL: 203 mg/dL — AB (ref 100–199)
Chol/HDL Ratio: 3.7 ratio units (ref 0.0–4.4)
HDL: 55 mg/dL (ref >39)
LDL CALC: 121 mg/dL — AB (ref 0–99)
TRIGLYCERIDES: 137 mg/dL (ref 0–149)
VLDL CHOLESTEROL CAL: 27 mg/dL (ref 5–40)

## 2016-05-01 ENCOUNTER — Ambulatory Visit (INDEPENDENT_AMBULATORY_CARE_PROVIDER_SITE_OTHER): Payer: Medicare Other

## 2016-05-01 ENCOUNTER — Ambulatory Visit (INDEPENDENT_AMBULATORY_CARE_PROVIDER_SITE_OTHER): Payer: Medicare Other | Admitting: Physician Assistant

## 2016-05-01 VITALS — BP 138/64 | HR 71 | Temp 98.3°F | Resp 18 | Wt 259.0 lb

## 2016-05-01 DIAGNOSIS — R05 Cough: Secondary | ICD-10-CM | POA: Diagnosis not present

## 2016-05-01 DIAGNOSIS — I2510 Atherosclerotic heart disease of native coronary artery without angina pectoris: Secondary | ICD-10-CM

## 2016-05-01 DIAGNOSIS — J069 Acute upper respiratory infection, unspecified: Secondary | ICD-10-CM

## 2016-05-01 DIAGNOSIS — B9789 Other viral agents as the cause of diseases classified elsewhere: Secondary | ICD-10-CM

## 2016-05-01 DIAGNOSIS — R059 Cough, unspecified: Secondary | ICD-10-CM

## 2016-05-01 LAB — POCT CBC
Granulocyte percent: 69.1 %G (ref 37–80)
HCT, POC: 37.4 % — AB (ref 37.7–47.9)
HEMOGLOBIN: 13.1 g/dL (ref 12.2–16.2)
LYMPH, POC: 1.3 (ref 0.6–3.4)
MCH, POC: 30.9 pg (ref 27–31.2)
MCHC: 35.2 g/dL (ref 31.8–35.4)
MCV: 88 fL (ref 80–97)
MID (cbc): 0.6 (ref 0–0.9)
MPV: 6.4 fL (ref 0–99.8)
POC Granulocyte: 4.4 (ref 2–6.9)
POC LYMPH %: 21.1 % (ref 10–50)
POC MID %: 9.8 %M (ref 0–12)
Platelet Count, POC: 281 10*3/uL (ref 142–424)
RBC: 4.25 M/uL (ref 4.04–5.48)
RDW, POC: 13.4 %
WBC: 6.3 10*3/uL (ref 4.6–10.2)

## 2016-05-01 MED ORDER — HYDROCODONE-HOMATROPINE 5-1.5 MG/5ML PO SYRP: 2.5000 mL | ORAL_SOLUTION | Freq: Three times a day (TID) | ORAL | 0 refills | Status: DC | PRN

## 2016-05-01 NOTE — Progress Notes (Signed)
05/01/2016 3:20 PM   DOB: [birth year] / MRN: [id number]  SUBJECTIVE:  Gail Burns is a 71 y.o. female presenting for cough, nasal congestion. This has been present for 5 days.  She denies SOB, DOE, chest pain with cough.  Denies leg swelling. Feels that she is getting worse. Denies GERD.   She denies a history of asthma and COPD. She has a 17.5 pack year history.  She has prediabetes. She did have an 80% blockage in 2006 and received ballon angioplasty.    She is allergic to augmentin [amoxicillin-pot clavulanate].   She  has a past medical history of Anxiety; Basal cell carcinoma; Benign neoplasm of colon; Coronary atherosclerosis of native coronary artery; Degeneration of lumbar or lumbosacral intervertebral disc; Depression; Diverticulosis of colon (without mention of hemorrhage); Esophageal reflux; Hyperlipidemia; Hypertension; Impaired glucose tolerance test; Internal hemorrhoids without mention of complication; Macular degeneration; Obesity, unspecified; Other dyspnea and respiratory abnormality; Other malaise and fatigue; Pancreatitis due to biliary obstruction (08/05/2012); Seborrheic dermatitis, unspecified; Tobacco use disorder; and Unspecified disorder of kidney and ureter.    She  reports that she quit smoking about 26 years ago. Her smoking use included Cigarettes. She has a 20.00 pack-year smoking history. She has never used smokeless tobacco. She reports that she does not drink alcohol or use drugs. She  reports that she does not currently engage in sexual activity. The patient  has a past surgical history that includes Cardiac catheterization (2006); dilatation and curettage (x 2); Tubal ligation (1982); Knee surgery; eye lid surgery (2012); Laser therapy and injections for macular degeneration; Cholecystectomy; Admission (08/05/2012); and Eye surgery.  Her family history includes Arthritis in her sister; COPD in her brother; Cancer in her mother; Dementia in her mother; Diabetes in her  mother; Heart disease in her brother, father, maternal grandfather, mother, and paternal grandmother; Heart disease (age of onset: 70) in her sister; Hyperlipidemia in her sister and sister; Hypertension in her sister; Macular degeneration in her mother; Parkinson's disease in her maternal grandmother; Rheumatic fever in her father; Stroke (age of onset: 41) in her sister.  Review of Systems  Constitutional: Positive for malaise/fatigue. Negative for diaphoresis and fever.  Cardiovascular: Negative for orthopnea and leg swelling.  Musculoskeletal: Positive for myalgias.  Skin: Negative for rash.  Neurological: Negative for dizziness.    The problem list and medications were reviewed and updated by myself where necessary and exist elsewhere in the encounter.   OBJECTIVE:  BP 138/64   Pulse 71   Temp 98.3 F (36.8 C) (Oral)   Resp 18   Wt 259 lb (117.5 kg)   SpO2 97%   BMI 40.57 kg/m   Physical Exam  Constitutional: She is oriented to person, place, and time. She appears well-developed and well-nourished. No distress.  HENT:  Right Ear: External ear normal.  Left Ear: External ear normal.  Nose: Nose normal.  Mouth/Throat: Oropharynx is clear and moist.  Cardiovascular: Normal rate and regular rhythm.   Pulmonary/Chest: Effort normal and breath sounds normal.  Musculoskeletal: Normal range of motion. She exhibits no edema.  Neurological: She is alert and oriented to person, place, and time.  Skin: Skin is warm and dry. She is not diaphoretic.    Lab Results  Component Value Date   HGBA1C 6.3 (H) 04/09/2016    Results for orders placed or performed in visit on 05/01/16 (from the past 72 hour(s))  POCT CBC     Status: Abnormal   Collection Time: 05/01/16  1:20 PM  Result Value Ref Range   WBC 6.3 4.6 - 10.2 K/uL   Lymph, poc 1.3 0.6 - 3.4   POC LYMPH PERCENT 21.1 10 - 50 %L   MID (cbc) 0.6 0 - 0.9   POC MID % 9.8 0 - 12 %M   POC Granulocyte 4.4 2 - 6.9   Granulocyte  percent 69.1 37 - 80 %G   RBC 4.25 4.04 - 5.48 M/uL   Hemoglobin 13.1 12.2 - 16.2 g/dL   HCT, POC 37.4 (A) 37.7 - 47.9 %   MCV 88.0 80 - 97 fL   MCH, POC 30.9 27 - 31.2 pg   MCHC 35.2 31.8 - 35.4 g/dL   RDW, POC 13.4 %   Platelet Count, POC 281 142 - 424 K/uL   MPV 6.4 0 - 99.8 fL    Dg Chest 2 View  Result Date: 05/01/2016 CLINICAL DATA:  Cough for 5 days. EXAM: CHEST  2 VIEW COMPARISON:  07/17/2006 FINDINGS: The heart size and mediastinal contours are within normal limits. Aortic atherosclerosis. Both lungs are clear. The visualized skeletal structures are unremarkable. IMPRESSION: No active cardiopulmonary disease. Aortic atherosclerosis. Electronically Signed   By: Earle Gell M.D.   On: 05/01/2016 13:23    ASSESSMENT AND PLAN:  Gail Burns was seen today for cough.  Diagnoses and all orders for this visit:  Cough -     DG Chest 2 View; Future -     POCT CBC  Viral URI with cough: CBC, exam and rads reassuring.  Will treat symptomatically for now. RTC if not improving.  -     HYDROcodone-homatropine (HYCODAN) 5-1.5 MG/5ML syrup; Take 2.5-5 mLs by mouth 3 (three) times daily as needed for cough.    The patient is advised to call or return to clinic if she does not see an improvement in symptoms, or to seek the care of the closest emergency department if she worsens with the above plan.   Philis Fendt, MHS, PA-C [facility] 05/01/2016 3:20 PM

## 2016-05-01 NOTE — Patient Instructions (Signed)
     IF you received an x-ray today, you will receive an invoice from [facility]. Please contact [facility] at [phone] with questions or concerns regarding your invoice.   IF you received labwork today, you will receive an invoice from [facility]. Please contact [facility] at [phone] with questions or concerns regarding your invoice.   Our billing staff will not be able to assist you with questions regarding bills from these companies.  You will be contacted with the lab results as soon as they are available. The fastest way to get your results is to activate your My Chart account. Instructions are located on the last page of this paperwork. If you have not heard from us regarding the results in 2 weeks, please contact this office.     

## 2016-05-05 ENCOUNTER — Telehealth: Payer: Self-pay

## 2016-05-05 DIAGNOSIS — B9789 Other viral agents as the cause of diseases classified elsewhere: Principal | ICD-10-CM

## 2016-05-05 DIAGNOSIS — J069 Acute upper respiratory infection, unspecified: Secondary | ICD-10-CM

## 2016-05-05 NOTE — Telephone Encounter (Signed)
Will you refill? Written right?

## 2016-05-05 NOTE — Telephone Encounter (Signed)
PATIENT STATES SHE SAW MICHAEL CLARK LAST WEEK FOR A COUGH. HE PRESCRIBED HER HYDROCODONE SYRUP AND TOLD HER TO CALL BACK IF SHE NEEDED TO HAVE IT REFILLED. SHE SAID IT SEEMS TO BE HELPING BUT SHE HAS A RATTLE IN HER CHEST BUT NOTHING COMES UP. SHE WOULD LIKE TO GET IT CALLED INTO HER PHARMACY. BEST PHONE [phone] (CELL) PHARMACY CHOICE IS [facility] IN [place]. [place]

## 2016-05-06 MED ORDER — HYDROCODONE-HOMATROPINE 5-1.5 MG/5ML PO SYRP: 2.5000 mL | ORAL_SOLUTION | Freq: Three times a day (TID) | ORAL | 0 refills | Status: DC | PRN

## 2016-05-06 NOTE — Telephone Encounter (Signed)
Will refill once. She will have to fill up.  Philis Fendt, MS, PA-C 4:03 PM, 05/06/2016

## 2016-05-06 NOTE — Telephone Encounter (Signed)
Message should read: "She will have to pick up."

## 2016-06-16 DIAGNOSIS — H353122 Nonexudative age-related macular degeneration, left eye, intermediate dry stage: Secondary | ICD-10-CM | POA: Diagnosis not present

## 2016-06-16 DIAGNOSIS — H353212 Exudative age-related macular degeneration, right eye, with inactive choroidal neovascularization: Secondary | ICD-10-CM | POA: Diagnosis not present

## 2016-07-16 ENCOUNTER — Other Ambulatory Visit: Payer: Self-pay | Admitting: Family Medicine

## 2016-07-16 NOTE — Telephone Encounter (Signed)
Pt calling requesting refills for rosuvastatin, escitalopram, meloxicam, and metoprolol tartrate. Pt also wanted to ask about Nexium. She said one of the side affects is diarrhea. She said she has been having loose stools and wanted to know if that was due to the Nexium. If so, pt wanted to know if she could stop taking it by either gradually stop taking it or just stop taking it altogether. Please advise. Pt callback number is [phone].

## 2016-07-22 NOTE — Telephone Encounter (Signed)
04/09/16 last ov  See nexium question and advise

## 2016-07-23 MED ORDER — ROSUVASTATIN CALCIUM 40 MG PO TABS: ORAL_TABLET | ORAL | 1 refills | Status: DC

## 2016-07-23 MED ORDER — ESCITALOPRAM OXALATE 20 MG PO TABS: ORAL_TABLET | ORAL | 1 refills | Status: DC

## 2016-07-23 MED ORDER — MELOXICAM 15 MG PO TABS: ORAL_TABLET | ORAL | 1 refills | Status: DC

## 2016-07-23 MED ORDER — METOPROLOL TARTRATE 25 MG PO TABS: 25.0000 mg | ORAL_TABLET | Freq: Two times a day (BID) | ORAL | 1 refills | Status: DC

## 2016-08-27 DIAGNOSIS — I2510 Atherosclerotic heart disease of native coronary artery without angina pectoris: Secondary | ICD-10-CM | POA: Diagnosis not present

## 2016-08-27 DIAGNOSIS — I10 Essential (primary) hypertension: Secondary | ICD-10-CM | POA: Diagnosis not present

## 2016-08-27 DIAGNOSIS — E782 Mixed hyperlipidemia: Secondary | ICD-10-CM | POA: Diagnosis not present

## 2016-08-27 DIAGNOSIS — R001 Bradycardia, unspecified: Secondary | ICD-10-CM | POA: Diagnosis not present

## 2016-08-29 DIAGNOSIS — L570 Actinic keratosis: Secondary | ICD-10-CM | POA: Diagnosis not present

## 2016-08-29 DIAGNOSIS — D1801 Hemangioma of skin and subcutaneous tissue: Secondary | ICD-10-CM | POA: Diagnosis not present

## 2016-08-29 DIAGNOSIS — L814 Other melanin hyperpigmentation: Secondary | ICD-10-CM | POA: Diagnosis not present

## 2016-08-29 DIAGNOSIS — L821 Other seborrheic keratosis: Secondary | ICD-10-CM | POA: Diagnosis not present

## 2016-10-07 ENCOUNTER — Ambulatory Visit: Payer: Medicare Other | Admitting: Family Medicine

## 2016-10-29 ENCOUNTER — Ambulatory Visit (INDEPENDENT_AMBULATORY_CARE_PROVIDER_SITE_OTHER): Payer: Medicare Other | Admitting: Family Medicine

## 2016-10-29 ENCOUNTER — Encounter: Payer: Self-pay | Admitting: Family Medicine

## 2016-10-29 VITALS — BP 127/69 | HR 60 | Temp 97.6°F | Resp 18 | Ht 67.72 in | Wt 251.0 lb

## 2016-10-29 DIAGNOSIS — Z860100 Personal history of colon polyps, unspecified: Secondary | ICD-10-CM

## 2016-10-29 DIAGNOSIS — I2510 Atherosclerotic heart disease of native coronary artery without angina pectoris: Secondary | ICD-10-CM | POA: Diagnosis not present

## 2016-10-29 DIAGNOSIS — E2839 Other primary ovarian failure: Secondary | ICD-10-CM

## 2016-10-29 DIAGNOSIS — Z0000 Encounter for general adult medical examination without abnormal findings: Secondary | ICD-10-CM | POA: Diagnosis not present

## 2016-10-29 DIAGNOSIS — I10 Essential (primary) hypertension: Secondary | ICD-10-CM

## 2016-10-29 DIAGNOSIS — E7800 Pure hypercholesterolemia, unspecified: Secondary | ICD-10-CM | POA: Diagnosis not present

## 2016-10-29 DIAGNOSIS — R7309 Other abnormal glucose: Secondary | ICD-10-CM

## 2016-10-29 DIAGNOSIS — F329 Major depressive disorder, single episode, unspecified: Secondary | ICD-10-CM | POA: Diagnosis not present

## 2016-10-29 DIAGNOSIS — Z1231 Encounter for screening mammogram for malignant neoplasm of breast: Secondary | ICD-10-CM

## 2016-10-29 LAB — POCT URINALYSIS DIP (MANUAL ENTRY)
BILIRUBIN UA: NEGATIVE
GLUCOSE UA: NEGATIVE mg/dL
Ketones, POC UA: NEGATIVE mg/dL
NITRITE UA: POSITIVE — AB
Protein Ur, POC: NEGATIVE mg/dL
Spec Grav, UA: 1.015 (ref 1.010–1.025)
UROBILINOGEN UA: 1 U/dL
pH, UA: 7 (ref 5.0–8.0)

## 2016-10-29 MED ORDER — METOPROLOL TARTRATE 25 MG PO TABS
25.0000 mg | ORAL_TABLET | Freq: Two times a day (BID) | ORAL | 1 refills | Status: DC
Start: 2016-10-29 — End: 2017-06-24

## 2016-10-29 MED ORDER — MELOXICAM 15 MG PO TABS: ORAL_TABLET | ORAL | 1 refills | Status: DC

## 2016-10-29 MED ORDER — ROSUVASTATIN CALCIUM 40 MG PO TABS: ORAL_TABLET | ORAL | 1 refills | Status: DC

## 2016-10-29 MED ORDER — ESCITALOPRAM OXALATE 10 MG PO TABS: ORAL_TABLET | ORAL | 1 refills | Status: DC

## 2016-10-29 MED ORDER — ZOSTER VAC RECOMB ADJUVANTED 50 MCG/0.5ML IM SUSR: 0.5000 mL | Freq: Once | INTRAMUSCULAR | 1 refills | Status: AC

## 2016-10-29 NOTE — Patient Instructions (Addendum)
We recommend that you schedule a mammogram for breast cancer screening. Typically, you do not need a referral to do this. Please contact a local imaging center to schedule your mammogram.  [facility] - [phone]  *ask for the Radiology Department [facility]) - [phone] or [phone]  [facility] - [phone] [address] [phone] [facility] - [phone]  *ask for the [facility] - [phone]  *ask for the Radiology Department [facility] - [phone]  *ask for the [facility] - [phone]   IF you received an x-ray today, you will receive an invoice from [facility]. Please contact [facility] at [phone] with questions or concerns regarding your invoice.   IF you received labwork today, you will receive an invoice from [facility]. Please contact [facility] at [phone] with questions or concerns regarding your invoice.   Our billing staff will not be able to assist you with questions regarding bills from these companies.  You will be contacted with the lab results as soon as they are available. The fastest way to get your results is to activate your My Chart account. Instructions are located on the last page of this paperwork. If you have not heard from [place] regarding the results in 2 weeks, please contact this office.      Preventive Care 73 Years and Older, Female Preventive care refers to lifestyle choices and visits with your health care provider that can promote health and wellness. What does preventive care include?  A yearly physical exam. This is also called an annual well check.  Dental exams once or twice a year.  Routine eye exams. Ask your health care provider how often you should have your eyes checked.  Personal lifestyle choices, including: ? Daily care of your  teeth and gums. ? Regular physical activity. ? Eating a healthy diet. ? Avoiding tobacco and drug use. ? Limiting alcohol use. ? Practicing safe sex. ? Taking low-dose aspirin every day. ? Taking vitamin and mineral supplements as recommended by your health care provider. What happens during an annual well check? The services and screenings done by your health care provider during your annual well check will depend on your age, overall health, lifestyle risk factors, and family history of disease. Counseling Your health care provider may ask you questions about your:  Alcohol use.  Tobacco use.  Drug use.  Emotional well-being.  Home and relationship well-being.  Sexual activity.  Eating habits.  History of falls.  Memory and ability to understand (cognition).  Work and work Statistician.  Reproductive health.  Screening You may have the following tests or measurements:  Height, weight, and BMI.  Blood pressure.  Lipid and cholesterol levels. These may be checked every 5 years, or more frequently if you are over 13 years old.  Skin check.  Lung cancer screening. You may have this screening every year starting at age 36 if you have a 30-pack-year history of smoking and currently smoke or have quit within the past 15 years.  Fecal occult blood test (FOBT) of the stool. You may have this test every year starting at age 40.  Flexible sigmoidoscopy or colonoscopy. You may have a sigmoidoscopy every 5 years or a colonoscopy every 10 years starting at age 9.  Hepatitis C blood test.  Hepatitis B blood test.  Sexually transmitted disease (STD) testing.  Diabetes screening. This is  done by checking your blood sugar (glucose) after you have not eaten for a while (fasting). You may have this done every 1-3 years.  Bone density scan. This is done to screen for osteoporosis. You may have this done starting at age 65.  Mammogram. This may be done every 1-2 years. Talk  to your health care provider about how often you should have regular mammograms.  Talk with your health care provider about your test results, treatment options, and if necessary, the need for more tests. Vaccines Your health care provider may recommend certain vaccines, such as:  Influenza vaccine. This is recommended every year.  Tetanus, diphtheria, and acellular pertussis (Tdap, Td) vaccine. You may need a Td booster every 10 years.  Varicella vaccine. You may need this if you have not been vaccinated.  Zoster vaccine. You may need this after age 60.  Measles, mumps, and rubella (MMR) vaccine. You may need at least one dose of MMR if you were born in 1957 or later. You may also need a second dose.  Pneumococcal 13-valent conjugate (PCV13) vaccine. One dose is recommended after age 65.  Pneumococcal polysaccharide (PPSV23) vaccine. One dose is recommended after age 65.  Meningococcal vaccine. You may need this if you have certain conditions.  Hepatitis A vaccine. You may need this if you have certain conditions or if you travel or work in places where you may be exposed to hepatitis A.  Hepatitis B vaccine. You may need this if you have certain conditions or if you travel or work in places where you may be exposed to hepatitis B.  Haemophilus influenzae type b (Hib) vaccine. You may need this if you have certain conditions.  Talk to your health care provider about which screenings and vaccines you need and how often you need them. This information is not intended to replace advice given to you by your health care provider. Make sure you discuss any questions you have with your health care provider. Document Released: 04/20/2015 Document Revised: 12/12/2015 Document Reviewed: 01/23/2015 Elsevier Interactive Patient Education  2017 [employer]  

## 2016-10-29 NOTE — Progress Notes (Signed)
Subjective:    Patient ID: Gail Burns, female    DOB: 09-26-1945, 71 y.o.   MRN: [id number]  10/29/2016  Medicare Wellness   HPI This 71 y.o. female presents for Annual Wellness Examination and chronic medical follow-up.  Last physical:  10-23-2015 Pap smear:    09/2012 Mammogram:  03/2014 Colonoscopy: 12/2013 Bone density: 12/2014 Eye exam: 06/2016 Dental exam:  2017  BP Readings from Last 3 Encounters:  10/29/16 127/69  05/01/16 138/64  04/09/16 (!) 160/62   Wt Readings from Last 3 Encounters:  10/29/16 251 lb (113.9 kg)  05/01/16 259 lb (117.5 kg)  04/09/16 258 lb (117 kg)   Immunization History  Administered Date(s) Administered  . Influenza Split 02/05/2011  . Influenza, Seasonal, Injecte, Preservative Fre 03/16/2012  . Influenza,inj,Quad PF,36+ Mos 12/21/2012, 04/26/2014, 04/24/2015, 04/09/2016  . Pneumococcal Conjugate-13 10/26/2013  . Pneumococcal Polysaccharide-23 10/25/2014  . Pneumococcal-Unspecified 07/11/2009  . Tdap 08/11/2007  . Zoster 04/07/2010    HTN: Patient reports good compliance with medication, good tolerance to medication, and good symptom control.  Kowalski decreased Metoprolol to 1/2 bid.   Hypercholesterolemia: Patient reports good compliance with medication, good tolerance to medication, and good symptom control.    Anxiety and depression: Patient reports good compliance with medication, good tolerance to medication, and good symptom control.  Taking Lexapro every other day for past month.    GERD: Patient reports good compliance with medication, good tolerance to medication, and good symptom control.  Stopped Omeprazole; no issues.  Renal insufficiency: taking Meloxicam qod.   Review of Systems  Constitutional: Negative for activity change, appetite change, chills, diaphoresis, fatigue, fever and unexpected weight change.  HENT: Negative for congestion, dental problem, drooling, ear discharge, ear pain, facial swelling, hearing loss,  mouth sores, nosebleeds, postnasal drip, rhinorrhea, sinus pressure, sneezing, sore throat, tinnitus, trouble swallowing and voice change.   Eyes: Negative for photophobia, pain, discharge, redness, itching and visual disturbance.  Respiratory: Negative for apnea, cough, choking, chest tightness, shortness of breath, wheezing and stridor.   Cardiovascular: Negative for chest pain, palpitations and leg swelling.  Gastrointestinal: Negative for abdominal distention, abdominal pain, anal bleeding, blood in stool, constipation, diarrhea, nausea, rectal pain and vomiting.  Endocrine: Negative for cold intolerance, heat intolerance, polydipsia, polyphagia and polyuria.  Genitourinary: Negative for decreased urine volume, difficulty urinating, dyspareunia, dysuria, enuresis, flank pain, frequency, genital sores, hematuria, menstrual problem, pelvic pain, urgency, vaginal bleeding, vaginal discharge and vaginal pain.  Musculoskeletal: Negative for arthralgias, back pain, gait problem, joint swelling, myalgias, neck pain and neck stiffness.  Skin: Negative for color change, pallor, rash and wound.  Allergic/Immunologic: Negative for environmental allergies, food allergies and immunocompromised state.  Neurological: Negative for dizziness, tremors, seizures, syncope, facial asymmetry, speech difficulty, weakness, light-headedness, numbness and headaches.  Hematological: Negative for adenopathy. Does not bruise/bleed easily.  Psychiatric/Behavioral: Negative for agitation, behavioral problems, confusion, decreased concentration, dysphoric mood, hallucinations, self-injury, sleep disturbance and suicidal ideas. The patient is not nervous/anxious and is not hyperactive.     Past Medical History:  Diagnosis Date  . Anxiety   . Basal cell carcinoma   . Benign neoplasm of colon   . Coronary atherosclerosis of native coronary artery   . Degeneration of lumbar or lumbosacral intervertebral disc   . Depression     . Diverticulosis of colon (without mention of hemorrhage)   . Esophageal reflux   . Hyperlipidemia   . Hypertension   . Impaired glucose tolerance test   . Internal hemorrhoids without mention  of complication   . Macular degeneration   . Obesity, unspecified   . Other dyspnea and respiratory abnormality   . Other malaise and fatigue   . Pancreatitis due to biliary obstruction 08/05/2012   two week admission in [place].  Step down unit.  . Seborrheic dermatitis, unspecified   . Tobacco use disorder   . Unspecified disorder of kidney and ureter    Past Surgical History:  Procedure Laterality Date  . Admission  08/05/2012   Acute pancreatitis. [place]. Two week admission; step down unit.  Marland Kitchen CARDIAC CATHETERIZATION  2006   80% blockage  . CHOLECYSTECTOMY    . dilatation and curettage  x 2  . eye lid surgery  2012  . EYE SURGERY     per patient's health survey - LASER  . KNEE SURGERY     Right  . Laser therapy and injections for macular degeneration    . TUBAL LIGATION  1982   Allergies  Allergen Reactions  . Augmentin [Amoxicillin-Pot Clavulanate] Hives    Social History   Social History  . Marital status: Married    Spouse name: N/A  . Number of children: 2  . Years of education: 12   Occupational History  . substitute school teacher    Social History Main Topics  . Smoking status: Former Smoker    Packs/day: 0.50    Years: 40.00    Types: Cigarettes    Quit date: 02/18/1990  . Smokeless tobacco: Never Used     Comment: quit 1991  . Alcohol use No     Comment: occasional twice a month;  per patient health survery form - No  . Drug use: No  . Sexual activity: Not Currently     Comment: due to atrophic vaginitis   Other Topics Concern  . Not on file   Social History Narrative   Marital status: married x 52 yrs, moderately happily married, no abuse, moved from [place] in 2007.  Husband with infidelity in past and in 2013.      Children:2 children; 4  grandchildren;  live in [place]      Lives: with husband.      Employment: unemployed. Substitute teacher.        Tobacco: none       Alcohol:  None       Drugs: none      Exercise: Minimal      Caffeine use: coffee 1 serving/day.      Always uses seat belts, smoke alarm in the home, no guns in the home.      ADLs: independent; drives.      Living Will:  FULL CODE; no prolonged measures. Has living will.     Family History  Problem Relation Age of Onset  . Cancer Mother   . Heart disease Mother          CAD     has pacemaker  . Dementia Mother   . Diabetes Mother   . Macular degeneration Mother   . Heart disease Father   . Rheumatic fever Father   . Heart disease Sister 66       CABG  . Hyperlipidemia Sister   . Stroke Sister 40       TIA's  . Heart disease Brother        CABG age 53  . COPD Brother   . Heart disease Maternal Grandfather   . Hyperlipidemia Sister   . Hypertension Sister   . Arthritis  Sister        DDD lumbar s/p surgery  . Parkinson's disease Maternal Grandmother   . Heart disease Paternal Grandmother        Objective:    BP 127/69   Pulse 60   Temp 97.6 F (36.4 C)   Resp 18   Ht 5' 7.72" (1.72 m)   Wt 251 lb (113.9 kg)   SpO2 98%   BMI 38.48 kg/m  Physical Exam  Constitutional: She is oriented to person, place, and time. She appears well-developed and well-nourished. No distress.  HENT:  Head: Normocephalic and atraumatic.  Right Ear: External ear normal.  Left Ear: External ear normal.  Nose: Nose normal.  Mouth/Throat: Oropharynx is clear and moist.  Eyes: Pupils are equal, round, and reactive to light. Conjunctivae and EOM are normal.  Neck: Normal range of motion and full passive range of motion without pain. Neck supple. No JVD present. Carotid bruit is not present. No thyromegaly present.  Cardiovascular: Normal rate, regular rhythm and normal heart sounds.  Exam reveals no gallop and no friction rub.   No murmur  heard. Pulmonary/Chest: Effort normal and breath sounds normal. She has no wheezes. She has no rales. Right breast exhibits no inverted nipple, no mass, no nipple discharge, no skin change and no tenderness. Left breast exhibits no inverted nipple, no mass, no nipple discharge, no skin change and no tenderness. Breasts are symmetrical.  Abdominal: Soft. Bowel sounds are normal. She exhibits no distension and no mass. There is no tenderness. There is no rebound and no guarding.  Musculoskeletal:       Right shoulder: Normal.       Left shoulder: Normal.       Cervical back: Normal.  Lymphadenopathy:    She has no cervical adenopathy.  Neurological: She is alert and oriented to person, place, and time. She has normal reflexes. No cranial nerve deficit. She exhibits normal muscle tone. Coordination normal.  Skin: Skin is warm and dry. No rash noted. She is not diaphoretic. No erythema. No pallor.  Psychiatric: She has a normal mood and affect. Her behavior is normal. Judgment and thought content normal.  Nursing note and vitals reviewed.  Results for orders placed or performed in visit on 10/29/16  POCT urinalysis dipstick  Result Value Ref Range   Color, UA yellow yellow   Clarity, UA clear clear   Glucose, UA negative negative mg/dL   Bilirubin, UA negative negative   Ketones, POC UA negative negative mg/dL   Spec Grav, UA 1.015 1.010 - 1.025   Blood, UA trace-lysed (A) negative   pH, UA 7.0 5.0 - 8.0   Protein Ur, POC negative negative mg/dL   Urobilinogen, UA 1.0 0.2 or 1.0 E.U./dL   Nitrite, UA Positive (A) Negative   Leukocytes, UA Small (1+) (A) Negative   Depression screen [facility] 2/9 10/29/2016 05/01/2016 04/09/2016 10/23/2015 09/26/2015  Decreased Interest 0 0 0 0 0  Down, Depressed, Hopeless 0 0 0 0 0  PHQ - 2 Score 0 0 0 0 0   Fall Risk  10/29/2016 05/01/2016 04/09/2016 10/23/2015 09/26/2015  Falls in the past year? No No No No No   Functional Status Survey: Is the patient deaf or  have difficulty hearing?: No Does the patient have difficulty seeing, even when wearing glasses/contacts?: No Does the patient have difficulty concentrating, remembering, or making decisions?: No Does the patient have difficulty walking or climbing stairs?: No Does the patient have difficulty dressing or  bathing?: No Does the patient have difficulty doing errands alone such as visiting a doctor's office or shopping?: No     Assessment & Plan:   1. Encounter for Medicare annual wellness exam   2. Coronary artery disease involving native coronary artery of native heart without angina pectoris   3. Essential hypertension, benign   4. Reactive depression   5. Other abnormal glucose   6. Personal history of colonic polyps   7. Pure hypercholesterolemia   8. Encounter for screening mammogram for breast cancer   9. Estrogen deficiency    -anticipatory guidance provided --- exercise, weight loss, safe driving practices, aspirin 81mg  daily. -obtain age appropriate screening labs and labs for chronic disease management. -moderate fall risk; no evidence of depression; no evidence of hearing loss.  Discussed advanced directives and living will; also discussed end of life issues including code status.     Orders Placed This Encounter  Procedures  . MM SCREENING BREAST TOMO BILATERAL    Standing Status:   Future    Standing Expiration Date:   12/30/2017    Order Specific Question:   Reason for Exam (SYMPTOM  OR DIAGNOSIS REQUIRED)    Answer:   annual screening    Order Specific Question:   Preferred imaging location?    Answer:   [facility]  . DG Bone Density    Standing Status:   Future    Standing Expiration Date:   12/30/2017    Order Specific Question:   Reason for Exam (SYMPTOM  OR DIAGNOSIS REQUIRED)    Answer:   estrogen deficiency    Order Specific Question:   Preferred imaging location?    Answer:   [facility]  . CBC with Differential/Platelet  . Comprehensive  metabolic panel    Order Specific Question:   Has the patient fasted?    Answer:   Yes  . Hemoglobin A1c  . Microalbumin, urine  . POCT urinalysis dipstick   Meds ordered this encounter  Medications  . Zoster Vac Recomb Adjuvanted [facility]) injection    Sig: Inject 0.5 mLs into the muscle once.    Dispense:  0.5 mL    Refill:  1  . rosuvastatin (CRESTOR) 40 MG tablet    Sig: TAKE 1 TABLET DAILY    Dispense:  90 tablet    Refill:  1  . escitalopram (LEXAPRO) 10 MG tablet    Sig: TAKE 1 TABLET DAILY    Dispense:  90 tablet    Refill:  1  . meloxicam (MOBIC) 15 MG tablet    Sig: TAKE 1 TABLET DAILY    Dispense:  90 tablet    Refill:  1  . metoprolol tartrate (LOPRESSOR) 25 MG tablet    Sig: Take 1 tablet (25 mg total) by mouth 2 (two) times daily.    Dispense:  180 tablet    Refill:  1    Return in about 6 months (around 05/01/2017) for recheck high blood pressure, high cholesterol, heart disease.   Hope Brandenburger Elayne Guerin, M.D. Primary Care at [facility] previously [facility] [address] [place], [place]  [phone] phone [phone] fax

## 2016-10-30 LAB — COMPREHENSIVE METABOLIC PANEL
A/G RATIO: 2.2 (ref 1.2–2.2)
ALK PHOS: 61 IU/L (ref 39–117)
ALT: 12 IU/L (ref 0–32)
AST: 19 IU/L (ref 0–40)
Albumin: 4.7 g/dL (ref 3.5–4.8)
BILIRUBIN TOTAL: 0.4 mg/dL (ref 0.0–1.2)
BUN / CREAT RATIO: 19 (ref 12–28)
BUN: 23 mg/dL (ref 8–27)
CHLORIDE: 103 mmol/L (ref 96–106)
CO2: 20 mmol/L (ref 20–29)
Calcium: 9.3 mg/dL (ref 8.7–10.3)
Creatinine, Ser: 1.21 mg/dL — ABNORMAL HIGH (ref 0.57–1.00)
GFR calc non Af Amer: 45 mL/min/{1.73_m2} — ABNORMAL LOW (ref >59)
GFR, EST AFRICAN AMERICAN: 52 mL/min/{1.73_m2} — AB (ref >59)
GLUCOSE: 96 mg/dL (ref 65–99)
Globulin, Total: 2.1 g/dL (ref 1.5–4.5)
POTASSIUM: 4.4 mmol/L (ref 3.5–5.2)
Sodium: 140 mmol/L (ref 134–144)
TOTAL PROTEIN: 6.8 g/dL (ref 6.0–8.5)

## 2016-10-30 LAB — CBC WITH DIFFERENTIAL/PLATELET
BASOS ABS: 0 10*3/uL (ref 0.0–0.2)
BASOS: 0 %
EOS (ABSOLUTE): 0.2 10*3/uL (ref 0.0–0.4)
Eos: 2 %
Hematocrit: 38.7 % (ref 34.0–46.6)
Hemoglobin: 12.9 g/dL (ref 11.1–15.9)
IMMATURE GRANS (ABS): 0 10*3/uL (ref 0.0–0.1)
Immature Granulocytes: 0 %
LYMPHS: 30 %
Lymphocytes Absolute: 2.2 10*3/uL (ref 0.7–3.1)
MCH: 30 pg (ref 26.6–33.0)
MCHC: 33.3 g/dL (ref 31.5–35.7)
MCV: 90 fL (ref 79–97)
Monocytes Absolute: 0.7 10*3/uL (ref 0.1–0.9)
Monocytes: 10 %
NEUTROS PCT: 58 %
Neutrophils Absolute: 4.1 10*3/uL (ref 1.4–7.0)
Platelets: 300 10*3/uL (ref 150–379)
RBC: 4.3 x10E6/uL (ref 3.77–5.28)
RDW: 13.6 % (ref 12.3–15.4)
WBC: 7.2 10*3/uL (ref 3.4–10.8)

## 2016-10-30 LAB — HEMOGLOBIN A1C
Est. average glucose Bld gHb Est-mCnc: 134 mg/dL
Hgb A1c MFr Bld: 6.3 % — ABNORMAL HIGH (ref 4.8–5.6)

## 2016-10-30 LAB — MICROALBUMIN, URINE: MICROALBUM., U, RANDOM: 10.9 ug/mL

## 2016-11-26 ENCOUNTER — Telehealth: Payer: Self-pay | Admitting: Family Medicine

## 2016-11-26 NOTE — Telephone Encounter (Signed)
PATIENT STATES THAT DR Tamala Julian WANT HER TO GET A BONE DENSITY TEST AND A MAMMOGRAM DONE PLEASE REFER HER TO [facility] IN [place]

## 2016-12-10 ENCOUNTER — Ambulatory Visit: Payer: Medicare Other

## 2016-12-22 DIAGNOSIS — H353212 Exudative age-related macular degeneration, right eye, with inactive choroidal neovascularization: Secondary | ICD-10-CM | POA: Diagnosis not present

## 2016-12-22 DIAGNOSIS — H353122 Nonexudative age-related macular degeneration, left eye, intermediate dry stage: Secondary | ICD-10-CM | POA: Diagnosis not present

## 2016-12-24 ENCOUNTER — Encounter: Payer: Self-pay | Admitting: Family Medicine

## 2016-12-24 ENCOUNTER — Ambulatory Visit (INDEPENDENT_AMBULATORY_CARE_PROVIDER_SITE_OTHER): Payer: Medicare Other | Admitting: Family Medicine

## 2016-12-24 VITALS — BP 133/70 | HR 71 | Temp 98.0°F | Resp 16 | Ht 67.32 in | Wt 250.0 lb

## 2016-12-24 DIAGNOSIS — I2510 Atherosclerotic heart disease of native coronary artery without angina pectoris: Secondary | ICD-10-CM

## 2016-12-24 DIAGNOSIS — K591 Functional diarrhea: Secondary | ICD-10-CM | POA: Diagnosis not present

## 2016-12-24 DIAGNOSIS — Z23 Encounter for immunization: Secondary | ICD-10-CM | POA: Diagnosis not present

## 2016-12-24 DIAGNOSIS — Z8719 Personal history of other diseases of the digestive system: Secondary | ICD-10-CM | POA: Diagnosis not present

## 2016-12-24 NOTE — Patient Instructions (Addendum)
RECOMMEND IMODIUM --- 1 TABLET TWICE DAILY AS NEEDED   Chronic Diarrhea Diarrhea is a condition in which a person passes frequent loose and watery stools. It can cause you to feel weak and dehydrated. Dehydration can make you tired and thirsty. It can also cause a dry mouth, decreased urination, and dark yellow urine. Diarrhea is a sign of another underlying problem, such as:  Infection.  Medication side effects.  Dietary intolerance, such as lactose intolerance.  Conditions such as celiac disease, irritable bowel syndrome (IBS), or inflammatory bowel disease (IBD).  In most cases, diarrhea lasts 2-3 days. Diarrhea that lasts longer than 4 weeks is called long-lasting (chronic) diarrhea. It is important that you treat your diarrhea as told by your health care provider. Follow these instructions at home: Follow these recommendations as told by your health care provider. Eating and drinking  Take an oral rehydration solution (ORS). This is a drink that is designed to keep you hydrated. It can be found at pharmacies and retail stores.  Drink clear fluids, such as water, ice chips, diluted fruit juice, and low-calorie sports drinks.  Follow the diet recommended by your health care provider. You may need to avoid foods that trigger diarrhea for you.  Avoid foods and beverages that contain a lot of sugar or caffeine.  Avoid alcohol.  Avoid spicy or fatty foods. General instructions  Drink enough fluid to keep your urine clear or pale yellow.  Wash your hands often and after each diarrhea episode. If soap and water are not available, use hand sanitizer.  Make sure that all people in your household wash their hands well and often.  Take over-the-counter and prescription medicines only as told by your health care provider.  If you were prescribed an antibiotic medicine, take it as told by your health care provider. Do not stop taking the antibiotic even if you start to feel  better.  Rest at home while you recover.  Watch your condition for any changes.  Take a warm bath to relieve any burning or pain from frequent diarrhea episodes.  Keep all follow-up visits as told by your health care provider. This is important. Contact a health care provider if:  You have a fever.  Your diarrhea gets worse or does not get better.  You have new symptoms.  You cannot drink fluids without vomiting.  You feel light-headed or dizzy.  You have a headache.  You have muscle cramps.  You have severe pain in the rectum. Get help right away if:  You have persistent vomiting.  You have chest pain.  You feel extremely weak or you faint.  You have bloody or black stools, or stools that look like tar.  You have severe pain, cramping, or bloating in your abdomen, or pain that stays in one place.  You have trouble breathing or you are breathing very quickly.  Your heart is beating very quickly.  Your skin feels cold and clammy.  You feel confused.  You have a severe headache.  You have signs of dehydration, such as: ? Dark urine, very little urine, or no urine. ? Cracked lips. ? Dry mouth. ? Sunken eyes. ? Sleepiness. ? Weakness. Summary  Chronic diarrhea is a condition in which a person passes frequent loose and watery stools for more than 4 weeks.  Diarrhea is a sign of another underlying problem.  Drink enough fluid to keep your urine clear or pale yellow to avoid dehydration.  Wash your hands often and after  each diarrhea episode. If soap and water are not available, use hand sanitizer.  It is important that you treat your diarrhea as told by your health care provider. This information is not intended to replace advice given to you by your health care provider. Make sure you discuss any questions you have with your health care provider. Document Released: 06/14/2003 Document Revised: 02/11/2016 Document Reviewed: 02/11/2016 Elsevier Interactive  Patient Education  2017 [employer].    IF you received an x-ray today, you will receive an invoice from [facility] Radiology. Please contact [facility] Radiology at [phone] with questions or concerns regarding your invoice.   IF you received labwork today, you will receive an invoice from [facility]. Please contact [facility] at [phone] with questions or concerns regarding your invoice.   Our billing staff will not be able to assist you with questions regarding bills from these companies.  You will be contacted with the lab results as soon as they are available. The fastest way to get your results is to activate your My Chart account. Instructions are located on the last page of this paperwork. If you have not heard from [place] regarding the results in 2 weeks, please contact this office.

## 2016-12-24 NOTE — Progress Notes (Signed)
Subjective:    Patient ID: Gail Burns, female    DOB: 28-Dec-1945, 71 y.o.   MRN: [id number]  12/24/2016  Diarrhea (x 2 months pt states the stool is like water )   HPI This 71 y.o. female presents for evaluation of diarrhea.  Has had loose stools for quite a while; looks like mud.  Has worsened in the past three months.  At time looks like mud.  No blood; no melena.  Dark brown.  No fever/chills/sweats.   Chronic sweating but not knew.  Sweats like crazy.  No abdominal cramping, nausea, vomiting, heartburn.  Quit taking Omeprazole recently because thought was causing diarrhea.  Only taking Lexparo qod; also taking Meloxicam.  Colonoscopy 2015 [facility]. Loose stools started after pancreatitis; prescribed Questran after pancreatitis.  Hates Questran.    S/p cholecystectomy in 04/2012; then had bad pancreatitis in 08/2012. Colonoscopy 2015.   Concerned about pancreatic cancer or colon cancer. Worried about cancer.  This is not normal.  Has lost a little weight and not sure why.  3-4 stools per day; usually in morning and after meals.  BP Readings from Last 3 Encounters:  12/24/16 133/70  10/29/16 127/69  05/01/16 138/64   Wt Readings from Last 3 Encounters:  12/24/16 250 lb (113.4 kg)  10/29/16 251 lb (113.9 kg)  05/01/16 259 lb (117.5 kg)   Immunization History  Administered Date(s) Administered  . Influenza Split 02/05/2011  . Influenza, Seasonal, Injecte, Preservative Fre 03/16/2012  . Influenza,inj,Quad PF,6+ Mos 12/21/2012, 04/26/2014, 04/24/2015, 04/09/2016, 12/24/2016  . Pneumococcal Conjugate-13 10/26/2013  . Pneumococcal Polysaccharide-23 10/25/2014  . Pneumococcal-Unspecified 07/11/2009  . Tdap 08/11/2007  . Zoster 04/07/2010    Review of Systems  Constitutional: Positive for unexpected weight change. Negative for chills, diaphoresis, fatigue and fever.  Eyes: Negative for visual disturbance.  Respiratory: Negative for cough and shortness of breath.     Cardiovascular: Negative for chest pain, palpitations and leg swelling.  Gastrointestinal: Positive for diarrhea. Negative for abdominal distention, abdominal pain, anal bleeding, blood in stool, constipation, nausea, rectal pain and vomiting.  Endocrine: Negative for cold intolerance, heat intolerance, polydipsia, polyphagia and polyuria.  Neurological: Negative for dizziness, tremors, seizures, syncope, facial asymmetry, speech difficulty, weakness, light-headedness, numbness and headaches.    Past Medical History:  Diagnosis Date  . Anxiety   . Basal cell carcinoma   . Benign neoplasm of colon   . Coronary atherosclerosis of native coronary artery   . Degeneration of lumbar or lumbosacral intervertebral disc   . Depression   . Diverticulosis of colon (without mention of hemorrhage)   . Esophageal reflux   . Hyperlipidemia   . Hypertension   . Impaired glucose tolerance test   . Internal hemorrhoids without mention of complication   . Macular degeneration   . Obesity, unspecified   . Other dyspnea and respiratory abnormality   . Other malaise and fatigue   . Pancreatitis due to biliary obstruction 08/05/2012   two week admission in [place].  Step down unit.  . Seborrheic dermatitis, unspecified   . Tobacco use disorder   . Unspecified disorder of kidney and ureter    Past Surgical History:  Procedure Laterality Date  . Admission  08/05/2012   Acute pancreatitis. [place]. Two week admission; step down unit.  Marland Kitchen CARDIAC CATHETERIZATION  2006   80% blockage  . CHOLECYSTECTOMY    . dilatation and curettage  x 2  . eye lid surgery  2012  . EYE SURGERY  per patient's health survey - LASER  . KNEE SURGERY     Right  . Laser therapy and injections for macular degeneration    . TUBAL LIGATION  1982   Allergies  Allergen Reactions  . Augmentin [Amoxicillin-Pot Clavulanate] Hives    Social History   Social History  . Marital status: Married    Spouse name: N/A  .  Number of children: 2  . Years of education: 12   Occupational History  . substitute school teacher    Social History Main Topics  . Smoking status: Former Smoker    Packs/day: 0.50    Years: 40.00    Types: Cigarettes    Quit date: 02/18/1990  . Smokeless tobacco: Never Used     Comment: quit 1991  . Alcohol use No     Comment: occasional twice a month;  per patient health survery form - No  . Drug use: No  . Sexual activity: Not Currently     Comment: due to atrophic vaginitis   Other Topics Concern  . Not on file   Social History Narrative   Marital status: married x 52 yrs, moderately happily married, no abuse, moved from [place] in 2007.  Husband with infidelity in past and in 2013.      Children:2 children; 4 grandchildren;  live in [place]      Lives: with husband.      Employment: unemployed. Substitute teacher.        Tobacco: none       Alcohol:  None       Drugs: none      Exercise: Minimal      Caffeine use: coffee 1 serving/day.      Always uses seat belts, smoke alarm in the home, no guns in the home.      ADLs: independent; drives.      Living Will:  FULL CODE; no prolonged measures. Has living will.     Family History  Problem Relation Age of Onset  . Cancer Mother   . Heart disease Mother          CAD     has pacemaker  . Dementia Mother   . Diabetes Mother   . Macular degeneration Mother   . Heart disease Father   . Rheumatic fever Father   . Heart disease Sister 39       CABG  . Hyperlipidemia Sister   . Stroke Sister 54       TIA's  . Heart disease Brother        CABG age 78  . COPD Brother   . Heart disease Maternal Grandfather   . Hyperlipidemia Sister   . Hypertension Sister   . Arthritis Sister        DDD lumbar s/p surgery  . Parkinson's disease Maternal Grandmother   . Heart disease Paternal Grandmother        Objective:    BP 133/70   Pulse 71   Temp 98 F (36.7 C) (Oral)   Resp 16   Ht 5' 7.32" (1.71 m)   Wt 250 lb  (113.4 kg)   SpO2 98%   BMI 38.78 kg/m  Physical Exam  Constitutional: She is oriented to person, place, and time. She appears well-developed and well-nourished. No distress.  HENT:  Head: Normocephalic and atraumatic.  Right Ear: External ear normal.  Left Ear: External ear normal.  Nose: Nose normal.  Mouth/Throat: Oropharynx is clear and moist.  Eyes: Pupils  are equal, round, and reactive to light. Conjunctivae and EOM are normal.  Neck: Normal range of motion. Neck supple. Carotid bruit is not present. No thyromegaly present.  Cardiovascular: Normal rate, regular rhythm, normal heart sounds and intact distal pulses.  Exam reveals no gallop and no friction rub.   No murmur heard. Pulmonary/Chest: Effort normal and breath sounds normal. She has no wheezes. She has no rales.  Abdominal: Soft. Bowel sounds are normal. She exhibits no distension and no mass. There is no tenderness. There is no rebound and no guarding.  Lymphadenopathy:    She has no cervical adenopathy.  Neurological: She is alert and oriented to person, place, and time. No cranial nerve deficit.  Skin: Skin is warm and dry. No rash noted. She is not diaphoretic. No erythema. No pallor.  Psychiatric: She has a normal mood and affect. Her behavior is normal.    No results found. Depression screen [facility] 2/9 12/24/2016 10/29/2016 05/01/2016 04/09/2016 10/23/2015  Decreased Interest 0 0 0 0 0  Down, Depressed, Hopeless 0 0 0 0 0  PHQ - 2 Score 0 0 0 0 0   Fall Risk  12/24/2016 10/29/2016 05/01/2016 04/09/2016 10/23/2015  Falls in the past year? No No No No No  Comment - - - - -        Assessment & Plan:   1. Functional diarrhea   2. History of pancreatitis   3. Need for prophylactic vaccination and inoculation against influenza    -chronic diarrhea since cholecystectomy yet worsening diarrhea in past few months.  History of pancreatitis and patient worried about pancreatic cancer or colon cancer. -obtain labs with stool  studies. -refer to GI; obtain CT abdomen and pelvis. -BRAT diet,hydration.   Orders Placed This Encounter  Procedures  . Clostridium Difficile by PCR    Order Specific Question:   Is your patient experiencing loose or watery stools (3 or more in 24 hours)?    Answer:   Yes    Order Specific Question:   Has the patient received laxatives in the last 24 hours?    Answer:   No    Order Specific Question:   Has a negative Cdiff test resulted in the last 7 days?    Answer:   No  . Ova and parasite examination  . Stool culture  . CT ABDOMEN PELVIS WO CONTRAST    Standing Status:   Future    Standing Expiration Date:   03/25/2018    Order Specific Question:   Preferred imaging location?    Answer:   [facility]    Order Specific Question:   Radiology Contrast Protocol - do NOT remove file path    Answer:   \\[email or web address].[email or web address]  . Flu Vaccine QUAD 36+ mos IM  . CBC with Differential/Platelet  . Comprehensive metabolic panel  . Amylase  . Lipase  . Sedimentation rate  . Ambulatory referral to Gastroenterology    Referral Priority:   Routine    Referral Type:   Consultation    Referral Reason:   Specialty Services Required    Referred to Provider:   Manya Silvas, MD    Requested Specialty:   Gastroenterology    Number of Visits Requested:   1   No orders of the defined types were placed in this encounter.   No Follow-up on file.   Vignesh Willert Elayne Guerin, M.D. Primary Care at [facility] previously Urgent [place],  [place]  [address] [phone] phone [phone] fax

## 2016-12-25 ENCOUNTER — Ambulatory Visit
Admission: RE | Admit: 2016-12-25 | Discharge: 2016-12-25 | Disposition: A | Discharge status: Home / Self Care | Payer: Medicare Other | Source: Ambulatory Visit | Attending: Family Medicine | Admitting: Family Medicine

## 2016-12-25 DIAGNOSIS — K591 Functional diarrhea: Secondary | ICD-10-CM | POA: Diagnosis not present

## 2016-12-25 DIAGNOSIS — E2839 Other primary ovarian failure: Secondary | ICD-10-CM

## 2016-12-25 DIAGNOSIS — M85852 Other specified disorders of bone density and structure, left thigh: Secondary | ICD-10-CM | POA: Diagnosis not present

## 2016-12-25 DIAGNOSIS — M8580 Other specified disorders of bone density and structure, unspecified site: Secondary | ICD-10-CM | POA: Insufficient documentation

## 2016-12-25 DIAGNOSIS — Z1231 Encounter for screening mammogram for malignant neoplasm of breast: Secondary | ICD-10-CM | POA: Diagnosis not present

## 2016-12-25 DIAGNOSIS — Z780 Asymptomatic menopausal state: Secondary | ICD-10-CM | POA: Diagnosis not present

## 2016-12-25 LAB — COMPREHENSIVE METABOLIC PANEL
ALBUMIN: 4.6 g/dL (ref 3.5–4.8)
ALK PHOS: 62 IU/L (ref 39–117)
ALT: 20 IU/L (ref 0–32)
AST: 25 IU/L (ref 0–40)
Albumin/Globulin Ratio: 1.7 (ref 1.2–2.2)
BILIRUBIN TOTAL: 0.5 mg/dL (ref 0.0–1.2)
BUN / CREAT RATIO: 16 (ref 12–28)
BUN: 17 mg/dL (ref 8–27)
CHLORIDE: 103 mmol/L (ref 96–106)
CO2: 23 mmol/L (ref 20–29)
CREATININE: 1.04 mg/dL — AB (ref 0.57–1.00)
Calcium: 9.9 mg/dL (ref 8.7–10.3)
GFR calc Af Amer: 62 mL/min/{1.73_m2} (ref >59)
GFR calc non Af Amer: 54 mL/min/{1.73_m2} — ABNORMAL LOW (ref >59)
GLUCOSE: 104 mg/dL — AB (ref 65–99)
Globulin, Total: 2.7 g/dL (ref 1.5–4.5)
Potassium: 5.1 mmol/L (ref 3.5–5.2)
Sodium: 144 mmol/L (ref 134–144)
Total Protein: 7.3 g/dL (ref 6.0–8.5)

## 2016-12-25 LAB — CBC WITH DIFFERENTIAL/PLATELET
BASOS ABS: 0 10*3/uL (ref 0.0–0.2)
BASOS: 0 %
EOS (ABSOLUTE): 0.1 10*3/uL (ref 0.0–0.4)
EOS: 2 %
HEMOGLOBIN: 13 g/dL (ref 11.1–15.9)
Hematocrit: 40.8 % (ref 34.0–46.6)
Immature Grans (Abs): 0 10*3/uL (ref 0.0–0.1)
Immature Granulocytes: 0 %
Lymphocytes Absolute: 1.8 10*3/uL (ref 0.7–3.1)
Lymphs: 26 %
MCH: 29 pg (ref 26.6–33.0)
MCHC: 31.9 g/dL (ref 31.5–35.7)
MCV: 91 fL (ref 79–97)
Monocytes Absolute: 0.6 10*3/uL (ref 0.1–0.9)
Monocytes: 9 %
NEUTROS PCT: 63 %
Neutrophils Absolute: 4.4 10*3/uL (ref 1.4–7.0)
PLATELETS: 360 10*3/uL (ref 150–379)
RBC: 4.48 x10E6/uL (ref 3.77–5.28)
RDW: 14.1 % (ref 12.3–15.4)
WBC: 7 10*3/uL (ref 3.4–10.8)

## 2016-12-25 LAB — LIPASE: LIPASE: 152 U/L — AB (ref 14–85)

## 2016-12-25 LAB — SEDIMENTATION RATE: Sed Rate: 39 mm/hr (ref 0–40)

## 2016-12-25 LAB — AMYLASE: AMYLASE: 57 U/L (ref 31–124)

## 2016-12-26 LAB — CLOSTRIDIUM DIFFICILE BY PCR: Toxigenic C. Difficile by PCR: NEGATIVE

## 2016-12-30 LAB — STOOL CULTURE: E coli, Shiga toxin Assay: NEGATIVE

## 2016-12-31 LAB — OVA AND PARASITE EXAMINATION

## 2017-01-12 DIAGNOSIS — H353122 Nonexudative age-related macular degeneration, left eye, intermediate dry stage: Secondary | ICD-10-CM | POA: Diagnosis not present

## 2017-01-21 ENCOUNTER — Ambulatory Visit
Admission: RE | Admit: 2017-01-21 | Discharge: 2017-01-21 | Disposition: A | Discharge status: Home / Self Care | Payer: Medicare Other | Source: Ambulatory Visit | Attending: Family Medicine | Admitting: Family Medicine

## 2017-01-21 DIAGNOSIS — M48061 Spinal stenosis, lumbar region without neurogenic claudication: Secondary | ICD-10-CM | POA: Diagnosis not present

## 2017-01-21 DIAGNOSIS — K591 Functional diarrhea: Secondary | ICD-10-CM | POA: Diagnosis not present

## 2017-01-21 DIAGNOSIS — I700 Atherosclerosis of aorta: Secondary | ICD-10-CM | POA: Diagnosis not present

## 2017-01-21 DIAGNOSIS — K5730 Diverticulosis of large intestine without perforation or abscess without bleeding: Secondary | ICD-10-CM | POA: Insufficient documentation

## 2017-02-09 DIAGNOSIS — L988 Other specified disorders of the skin and subcutaneous tissue: Secondary | ICD-10-CM | POA: Diagnosis not present

## 2017-02-09 DIAGNOSIS — L739 Follicular disorder, unspecified: Secondary | ICD-10-CM | POA: Diagnosis not present

## 2017-02-09 DIAGNOSIS — L820 Inflamed seborrheic keratosis: Secondary | ICD-10-CM | POA: Diagnosis not present

## 2017-02-09 DIAGNOSIS — L821 Other seborrheic keratosis: Secondary | ICD-10-CM | POA: Diagnosis not present

## 2017-02-09 DIAGNOSIS — L570 Actinic keratosis: Secondary | ICD-10-CM | POA: Diagnosis not present

## 2017-02-11 DIAGNOSIS — H353122 Nonexudative age-related macular degeneration, left eye, intermediate dry stage: Secondary | ICD-10-CM | POA: Diagnosis not present

## 2017-03-02 DIAGNOSIS — I10 Essential (primary) hypertension: Secondary | ICD-10-CM | POA: Diagnosis not present

## 2017-03-02 DIAGNOSIS — R001 Bradycardia, unspecified: Secondary | ICD-10-CM | POA: Diagnosis not present

## 2017-03-02 DIAGNOSIS — E782 Mixed hyperlipidemia: Secondary | ICD-10-CM | POA: Diagnosis not present

## 2017-03-02 DIAGNOSIS — I2510 Atherosclerotic heart disease of native coronary artery without angina pectoris: Secondary | ICD-10-CM | POA: Diagnosis not present

## 2017-03-02 DIAGNOSIS — R079 Chest pain, unspecified: Secondary | ICD-10-CM | POA: Diagnosis not present

## 2017-03-12 DIAGNOSIS — R079 Chest pain, unspecified: Secondary | ICD-10-CM | POA: Diagnosis not present

## 2017-03-13 DIAGNOSIS — H353122 Nonexudative age-related macular degeneration, left eye, intermediate dry stage: Secondary | ICD-10-CM | POA: Diagnosis not present

## 2017-03-19 DIAGNOSIS — R001 Bradycardia, unspecified: Secondary | ICD-10-CM | POA: Diagnosis not present

## 2017-03-19 DIAGNOSIS — E782 Mixed hyperlipidemia: Secondary | ICD-10-CM | POA: Diagnosis not present

## 2017-03-19 DIAGNOSIS — I2510 Atherosclerotic heart disease of native coronary artery without angina pectoris: Secondary | ICD-10-CM | POA: Diagnosis not present

## 2017-03-19 DIAGNOSIS — I10 Essential (primary) hypertension: Secondary | ICD-10-CM | POA: Diagnosis not present

## 2017-03-19 DIAGNOSIS — R197 Diarrhea, unspecified: Secondary | ICD-10-CM | POA: Diagnosis not present

## 2017-03-20 DIAGNOSIS — L988 Other specified disorders of the skin and subcutaneous tissue: Secondary | ICD-10-CM | POA: Diagnosis not present

## 2017-03-20 DIAGNOSIS — L820 Inflamed seborrheic keratosis: Secondary | ICD-10-CM | POA: Diagnosis not present

## 2017-03-20 DIAGNOSIS — L570 Actinic keratosis: Secondary | ICD-10-CM | POA: Diagnosis not present

## 2017-03-20 DIAGNOSIS — L821 Other seborrheic keratosis: Secondary | ICD-10-CM | POA: Diagnosis not present

## 2017-03-23 ENCOUNTER — Other Ambulatory Visit
Admission: RE | Admit: 2017-03-23 | Discharge: 2017-03-23 | Disposition: A | Discharge status: Home / Self Care | Payer: Medicare Other | Source: Other Acute Inpatient Hospital | Attending: Nurse Practitioner | Admitting: Nurse Practitioner

## 2017-03-23 DIAGNOSIS — R197 Diarrhea, unspecified: Secondary | ICD-10-CM | POA: Insufficient documentation

## 2017-03-23 LAB — C DIFFICILE QUICK SCREEN W PCR REFLEX
C Diff antigen: NEGATIVE
C Diff interpretation: NOT DETECTED
C Diff toxin: NEGATIVE

## 2017-03-25 LAB — PANCREATIC ELASTASE, FECAL: Pancreatic Elastase-1, Stool: >500 ug Elast./g (ref >200)

## 2017-03-25 LAB — CALPROTECTIN, FECAL: Calprotectin, Fecal: 22 ug/g (ref 0–120)

## 2017-04-03 ENCOUNTER — Other Ambulatory Visit: Payer: Self-pay | Admitting: Family Medicine

## 2017-04-07 HISTORY — PX: CATARACT EXTRACTION W/ INTRAOCULAR LENS  IMPLANT, BILATERAL: SHX1307

## 2017-04-12 DIAGNOSIS — H353122 Nonexudative age-related macular degeneration, left eye, intermediate dry stage: Secondary | ICD-10-CM | POA: Diagnosis not present

## 2017-04-17 DIAGNOSIS — E782 Mixed hyperlipidemia: Secondary | ICD-10-CM | POA: Diagnosis not present

## 2017-04-17 DIAGNOSIS — R001 Bradycardia, unspecified: Secondary | ICD-10-CM | POA: Diagnosis not present

## 2017-04-17 DIAGNOSIS — I2510 Atherosclerotic heart disease of native coronary artery without angina pectoris: Secondary | ICD-10-CM | POA: Diagnosis not present

## 2017-04-17 DIAGNOSIS — I10 Essential (primary) hypertension: Secondary | ICD-10-CM | POA: Diagnosis not present

## 2017-05-12 DIAGNOSIS — H353122 Nonexudative age-related macular degeneration, left eye, intermediate dry stage: Secondary | ICD-10-CM | POA: Diagnosis not present

## 2017-05-25 DIAGNOSIS — Z8719 Personal history of other diseases of the digestive system: Secondary | ICD-10-CM | POA: Diagnosis not present

## 2017-05-25 DIAGNOSIS — R935 Abnormal findings on diagnostic imaging of other abdominal regions, including retroperitoneum: Secondary | ICD-10-CM | POA: Diagnosis not present

## 2017-05-25 DIAGNOSIS — R197 Diarrhea, unspecified: Secondary | ICD-10-CM | POA: Diagnosis not present

## 2017-06-04 ENCOUNTER — Telehealth: Payer: Self-pay | Admitting: Family Medicine

## 2017-06-04 NOTE — Telephone Encounter (Signed)
Copied from [place]. Topic: Quick Communication - Rx Refill/Question >> Jun 04, 2017 11:40 AM Synthia Innocent wrote: Medication: escitalopram (LEXAPRO) 20 MG tablet, tricor 145mg , rosuvastatin (CRESTOR) 40 MG tablet  90 day supply   Has the patient contacted their pharmacy? Yes.     (Agent: If no, request that the patient contact the pharmacy for the refill.)   Preferred Pharmacy (with phone number or street name):[facility] Fax # [phone]   Agent: Please be advised that RX refills may take up to 3 business days. We ask that you follow-up with your pharmacy.

## 2017-06-05 NOTE — Telephone Encounter (Signed)
Left Vm.  Labs due prior to refills

## 2017-06-08 ENCOUNTER — Other Ambulatory Visit: Payer: Self-pay | Admitting: *Deleted

## 2017-06-08 ENCOUNTER — Encounter: Payer: Self-pay | Admitting: *Deleted

## 2017-06-08 ENCOUNTER — Telehealth: Payer: Self-pay | Admitting: *Deleted

## 2017-06-08 DIAGNOSIS — F411 Generalized anxiety disorder: Secondary | ICD-10-CM

## 2017-06-08 MED ORDER — ESCITALOPRAM OXALATE 20 MG PO TABS: 20.0000 mg | ORAL_TABLET | Freq: Every day | ORAL | 0 refills | Status: DC

## 2017-06-08 MED ORDER — ROSUVASTATIN CALCIUM 40 MG PO TABS: ORAL_TABLET | ORAL | 1 refills | Status: AC

## 2017-06-08 NOTE — Telephone Encounter (Signed)
Prescription refilled for 90 days  Patient advised no more refills without appointment

## 2017-06-08 NOTE — Telephone Encounter (Signed)
This encounter was created in error - please disregard.

## 2017-06-09 ENCOUNTER — Encounter: Payer: Self-pay | Admitting: *Deleted

## 2017-06-10 ENCOUNTER — Other Ambulatory Visit: Payer: Self-pay

## 2017-06-10 ENCOUNTER — Ambulatory Visit: Payer: Medicare Other | Admitting: Anesthesiology

## 2017-06-10 ENCOUNTER — Encounter
Admission: RE | Disposition: A | Discharge status: Home / Self Care | Payer: Self-pay | Source: Ambulatory Visit | Attending: Unknown Physician Specialty

## 2017-06-10 ENCOUNTER — Ambulatory Visit
Admission: RE | Admit: 2017-06-10 | Discharge: 2017-06-10 | Disposition: A | Discharge status: Home / Self Care | Payer: Medicare Other | Source: Ambulatory Visit | Attending: Unknown Physician Specialty | Admitting: Unknown Physician Specialty

## 2017-06-10 ENCOUNTER — Encounter: Payer: Self-pay | Admitting: Student

## 2017-06-10 DIAGNOSIS — H3530 Unspecified macular degeneration: Secondary | ICD-10-CM | POA: Insufficient documentation

## 2017-06-10 DIAGNOSIS — Z79899 Other long term (current) drug therapy: Secondary | ICD-10-CM | POA: Insufficient documentation

## 2017-06-10 DIAGNOSIS — Z7982 Long term (current) use of aspirin: Secondary | ICD-10-CM | POA: Diagnosis not present

## 2017-06-10 DIAGNOSIS — F419 Anxiety disorder, unspecified: Secondary | ICD-10-CM | POA: Insufficient documentation

## 2017-06-10 DIAGNOSIS — Z880 Allergy status to penicillin: Secondary | ICD-10-CM | POA: Diagnosis not present

## 2017-06-10 DIAGNOSIS — K2980 Duodenitis without bleeding: Secondary | ICD-10-CM | POA: Insufficient documentation

## 2017-06-10 DIAGNOSIS — M1990 Unspecified osteoarthritis, unspecified site: Secondary | ICD-10-CM | POA: Diagnosis not present

## 2017-06-10 DIAGNOSIS — K5790 Diverticulosis of intestine, part unspecified, without perforation or abscess without bleeding: Secondary | ICD-10-CM | POA: Diagnosis not present

## 2017-06-10 DIAGNOSIS — Z85828 Personal history of other malignant neoplasm of skin: Secondary | ICD-10-CM | POA: Insufficient documentation

## 2017-06-10 DIAGNOSIS — K2970 Gastritis, unspecified, without bleeding: Secondary | ICD-10-CM | POA: Diagnosis not present

## 2017-06-10 DIAGNOSIS — E785 Hyperlipidemia, unspecified: Secondary | ICD-10-CM | POA: Diagnosis not present

## 2017-06-10 DIAGNOSIS — K529 Noninfective gastroenteritis and colitis, unspecified: Secondary | ICD-10-CM | POA: Diagnosis not present

## 2017-06-10 DIAGNOSIS — K2990 Gastroduodenitis, unspecified, without bleeding: Secondary | ICD-10-CM | POA: Diagnosis not present

## 2017-06-10 DIAGNOSIS — Z6838 Body mass index (BMI) 38.0-38.9, adult: Secondary | ICD-10-CM | POA: Insufficient documentation

## 2017-06-10 DIAGNOSIS — Z8249 Family history of ischemic heart disease and other diseases of the circulatory system: Secondary | ICD-10-CM | POA: Insufficient documentation

## 2017-06-10 DIAGNOSIS — R197 Diarrhea, unspecified: Secondary | ICD-10-CM | POA: Diagnosis not present

## 2017-06-10 DIAGNOSIS — Z87891 Personal history of nicotine dependence: Secondary | ICD-10-CM | POA: Diagnosis not present

## 2017-06-10 DIAGNOSIS — F329 Major depressive disorder, single episode, unspecified: Secondary | ICD-10-CM | POA: Diagnosis not present

## 2017-06-10 DIAGNOSIS — K219 Gastro-esophageal reflux disease without esophagitis: Secondary | ICD-10-CM | POA: Diagnosis not present

## 2017-06-10 DIAGNOSIS — I10 Essential (primary) hypertension: Secondary | ICD-10-CM | POA: Diagnosis not present

## 2017-06-10 DIAGNOSIS — Z823 Family history of stroke: Secondary | ICD-10-CM | POA: Insufficient documentation

## 2017-06-10 DIAGNOSIS — F418 Other specified anxiety disorders: Secondary | ICD-10-CM | POA: Diagnosis not present

## 2017-06-10 DIAGNOSIS — K621 Rectal polyp: Secondary | ICD-10-CM | POA: Insufficient documentation

## 2017-06-10 DIAGNOSIS — K5730 Diverticulosis of large intestine without perforation or abscess without bleeding: Secondary | ICD-10-CM | POA: Diagnosis not present

## 2017-06-10 DIAGNOSIS — E669 Obesity, unspecified: Secondary | ICD-10-CM | POA: Insufficient documentation

## 2017-06-10 DIAGNOSIS — K2950 Unspecified chronic gastritis without bleeding: Secondary | ICD-10-CM | POA: Diagnosis not present

## 2017-06-10 DIAGNOSIS — K640 First degree hemorrhoids: Secondary | ICD-10-CM | POA: Insufficient documentation

## 2017-06-10 DIAGNOSIS — E7800 Pure hypercholesterolemia, unspecified: Secondary | ICD-10-CM | POA: Diagnosis not present

## 2017-06-10 DIAGNOSIS — K2900 Acute gastritis without bleeding: Secondary | ICD-10-CM | POA: Diagnosis not present

## 2017-06-10 DIAGNOSIS — I2510 Atherosclerotic heart disease of native coronary artery without angina pectoris: Secondary | ICD-10-CM | POA: Diagnosis not present

## 2017-06-10 DIAGNOSIS — K5289 Other specified noninfective gastroenteritis and colitis: Secondary | ICD-10-CM | POA: Diagnosis not present

## 2017-06-10 HISTORY — DX: Diarrhea, unspecified: R19.7

## 2017-06-10 HISTORY — PX: ESOPHAGOGASTRODUODENOSCOPY (EGD) WITH PROPOFOL: SHX5813

## 2017-06-10 HISTORY — DX: Personal history of other diseases of the digestive system: Z87.19

## 2017-06-10 HISTORY — PX: COLONOSCOPY WITH PROPOFOL: SHX5780

## 2017-06-10 SURGERY — COLONOSCOPY WITH PROPOFOL
Anesthesia: General

## 2017-06-10 MED ORDER — SODIUM CHLORIDE 0.9 % IV SOLN: INTRAVENOUS | Status: DC

## 2017-06-10 MED ORDER — PROPOFOL 500 MG/50ML IV EMUL
INTRAVENOUS | Status: AC
  Filled 2017-06-10: qty 50

## 2017-06-10 MED ORDER — PROPOFOL 10 MG/ML IV BOLUS
INTRAVENOUS | Status: DC | PRN
  Administered 2017-06-10: 100 mg via INTRAVENOUS

## 2017-06-10 MED ORDER — PROPOFOL 500 MG/50ML IV EMUL
INTRAVENOUS | Status: DC | PRN
  Administered 2017-06-10: 160 ug/kg/min via INTRAVENOUS

## 2017-06-10 MED ORDER — EPHEDRINE SULFATE 50 MG/ML IJ SOLN
INTRAMUSCULAR | Status: DC | PRN
  Administered 2017-06-10: 10 mg via INTRAVENOUS

## 2017-06-10 MED ORDER — SODIUM CHLORIDE 0.9 % IV SOLN
INTRAVENOUS | Status: DC | PRN
  Administered 2017-06-10: 09:00:00 via INTRAVENOUS

## 2017-06-10 MED ORDER — FENTANYL CITRATE (PF) 100 MCG/2ML IJ SOLN
INTRAMUSCULAR | Status: AC
  Filled 2017-06-10: qty 2

## 2017-06-10 MED ORDER — FENTANYL CITRATE (PF) 100 MCG/2ML IJ SOLN
INTRAMUSCULAR | Status: DC | PRN
  Administered 2017-06-10 (×2): 50 ug via INTRAVENOUS

## 2017-06-10 MED ORDER — LIDOCAINE 2% (20 MG/ML) 5 ML SYRINGE
INTRAMUSCULAR | Status: DC | PRN
  Administered 2017-06-10: 30 mg via INTRAVENOUS

## 2017-06-10 MED ORDER — PHENYLEPHRINE HCL 10 MG/ML IJ SOLN
INTRAMUSCULAR | Status: DC | PRN
  Administered 2017-06-10: 100 ug via INTRAVENOUS

## 2017-06-10 NOTE — Op Note (Signed)
[facility] Gastroenterology Patient Name: Gail Burns Procedure Date: 06/10/2017 8:50 AM MRN: [id number] Account #: [id number] Date of Birth: [birth year] Admit Type: Outpatient Age: 72 Room: [facility] ENDO ROOM 3 Gender: Female Note Status: Finalized Procedure:            Colonoscopy Indications:          Clinically significant diarrhea of unexplained origin Providers:            Manya Silvas, MD Medicines:            Propofol per Anesthesia Complications:        No immediate complications. Procedure:            Pre-Anesthesia Assessment:                       - After reviewing the risks and benefits, the patient                        was deemed in satisfactory condition to undergo the                        procedure.                       After obtaining informed consent, the colonoscope was                        passed under direct vision. Throughout the procedure,                        the patient's blood pressure, pulse, and oxygen                        saturations were monitored continuously. The                        Colonoscope was introduced through the anus and                        advanced to the the cecum, identified by appendiceal                        orifice and ileocecal valve. The colonoscopy was                        performed without difficulty. The patient tolerated the                        procedure well. The quality of the bowel preparation                        was good. Findings:      A diminutive polyp was found in the rectum. The polyp was sessile. The       polyp was removed with a jumbo cold forceps. Resection and retrieval       were complete.      An area of mildly congested mucosa was found in the cecum. Biopsies were       taken with a cold forceps for histology. Biopsies done in ascending       colon, transverse colon, descending colon and sigmoid colon. Done for  problem with diarrhea.      A few small-mouthed  diverticula were found in the descending colon.      Internal hemorrhoids were found during endoscopy. The hemorrhoids were       small and Grade I (internal hemorrhoids that do not prolapse). Impression:           - One diminutive polyp in the rectum, removed with a                        jumbo cold forceps. Resected and retrieved.                       - Congested mucosa in the cecum. Biopsied.                       - Diverticulosis in the descending colon.                       - Internal hemorrhoids. Recommendation:       - Await pathology results. Manya Silvas, MD 06/10/2017 9:52:07 AM This report has been signed electronically. Number of Addenda: 0 Note Initiated On: 06/10/2017 8:50 AM Scope Withdrawal Time: 0 hours 9 minutes 33 seconds  Total Procedure Duration: 0 hours 13 minutes 51 seconds       [facility]

## 2017-06-10 NOTE — Anesthesia Post-op Follow-up Note (Signed)
Anesthesia QCDR form completed.        

## 2017-06-10 NOTE — Transfer of Care (Signed)
Immediate Anesthesia Transfer of Care Note  Patient: Gail Burns  Procedure(s) Performed: COLONOSCOPY WITH PROPOFOL (N/A ) ESOPHAGOGASTRODUODENOSCOPY (EGD) WITH PROPOFOL (N/A )  Patient Location: PACU and Endoscopy Unit  Anesthesia Type:General  Level of Consciousness: awake and drowsy  Airway & Oxygen Therapy: Patient Spontanous Breathing and Patient connected to nasal cannula oxygen  Post-op Assessment: Report given to RN and Post -op Vital signs reviewed and stable  Post vital signs: Reviewed and stable  Last Vitals:  Vitals:   06/10/17 0850 06/10/17 0953  BP: 131/65   Pulse: 79   Resp: 16   Temp: (!) 36.1 C (!) (P) 35.7 C  SpO2: 96%     Last Pain:  Vitals:   06/10/17 0953  TempSrc: (P) Tympanic         Complications: No apparent anesthesia complications

## 2017-06-10 NOTE — H&P (Signed)
Primary Care Physician:  Wardell Honour, MD Primary Gastroenterologist:  Dr. Vira Agar  Pre-Procedure History & Physical: HPI:  Gail Burns is a 72 y.o. female is here for an endoscopy and colonoscopy.  These are for diarrhea, PH colon polyps and evaluate CT findings of gastritis and duodenitis   Past Medical History:  Diagnosis Date  . Anxiety   . Basal cell carcinoma   . Benign neoplasm of colon   . Coronary atherosclerosis of native coronary artery   . Degeneration of lumbar or lumbosacral intervertebral disc   . Depression   . Diarrhea   . Diverticulosis of colon (without mention of hemorrhage)   . Esophageal reflux   . H/O acute pancreatitis   . Hyperlipidemia   . Hypertension   . Impaired glucose tolerance test   . Internal hemorrhoids without mention of complication   . Macular degeneration   . Obesity, unspecified   . Other dyspnea and respiratory abnormality   . Other malaise and fatigue   . Pancreatitis due to biliary obstruction 08/05/2012   two week admission in [place].  Step down unit.  . Seborrheic dermatitis, unspecified   . Tobacco use disorder   . Unspecified disorder of kidney and ureter     Past Surgical History:  Procedure Laterality Date  . Admission  08/05/2012   Acute pancreatitis. [place]. Two week admission; step down unit.  Marland Kitchen CARDIAC CATHETERIZATION  2006   80% blockage  . CHOLECYSTECTOMY    . colonoscopy with polypectomy    . dilatation and curettage  x 2  . DILATION AND CURETTAGE OF UTERUS    . eye lid surgery  2012  . EYE SURGERY     per patient's health survey - LASER  . KNEE SURGERY     Right  . Laser therapy and injections for macular degeneration    . TUBAL LIGATION  1982    Prior to Admission medications   Medication Sig Start Date End Date Taking? Authorizing Provider  aspirin 81 MG tablet Take 81 mg by mouth daily.   Yes [provider]  calcium carbonate (OS-CAL) 600 MG TABS Take 600 mg by mouth 2 (two) times  daily with a meal.   Yes [provider]  escitalopram (LEXAPRO) 20 MG tablet Take 1 tablet (20 mg total) by mouth daily. 06/08/17  Yes Wardell Honour, MD  fenofibrate 160 MG tablet Take 160 mg by mouth daily.   Yes [provider]  omeprazole (PRILOSEC) 40 MG capsule Take 40 mg by mouth daily.   Yes [provider]  meloxicam (MOBIC) 15 MG tablet TAKE 1 TABLET DAILY Patient not taking: Reported on 06/10/2017 10/29/16   Wardell Honour, MD  metoprolol tartrate (LOPRESSOR) 25 MG tablet Take 1 tablet (25 mg total) by mouth 2 (two) times daily. Patient not taking: Reported on 06/10/2017 10/29/16   Wardell Honour, MD  Misc Natural Products (LUTEIN VISION BLEND PO) Take by mouth daily.    [provider]  Multiple Vitamin (MULTIVITAMIN) tablet Take 1 tablet by mouth daily.    [provider]  rosuvastatin (CRESTOR) 40 MG tablet TAKE 1 TABLET DAILY 06/08/17   Wardell Honour, MD    Allergies as of 03/23/2017 - Review Complete 12/24/2016  Allergen Reaction Noted  . Augmentin [amoxicillin-pot clavulanate] Hives 02/17/2012    Family History  Problem Relation Age of Onset  . Cancer Mother   . Heart disease Mother  CAD     has pacemaker  . Dementia Mother   . Diabetes Mother   . Macular degeneration Mother   . Heart disease Father   . Rheumatic fever Father   . Heart disease Sister 66       CABG  . Hyperlipidemia Sister   . Stroke Sister 68       TIA's  . Heart disease Brother        CABG age 31  . COPD Brother   . Heart disease Maternal Grandfather   . Hyperlipidemia Sister   . Hypertension Sister   . Arthritis Sister        DDD lumbar s/p surgery  . Parkinson's disease Maternal Grandmother   . Heart disease Paternal Grandmother     Social History   Socioeconomic History  . Marital status: Married    Spouse name: Not on file  . Number of children: 2  . Years of education: 6  . Highest education level: Not on file  Social Needs   . Financial resource strain: Not on file  . Food insecurity - worry: Not on file  . Food insecurity - inability: Not on file  . Transportation needs - medical: Not on file  . Transportation needs - non-medical: Not on file  Occupational History  . Occupation: substitute Education officer, museum  Tobacco Use  . Smoking status: Former Smoker    Packs/day: 0.50    Years: 40.00    Pack years: 20.00    Types: Cigarettes    Last attempt to quit: 02/18/1990    Years since quitting: 27.3  . Smokeless tobacco: Never Used  . Tobacco comment: quit 1991  Substance and Sexual Activity  . Alcohol use: No    Comment: occasional twice a month;  per patient health survery form - No  . Drug use: No  . Sexual activity: Not Currently    Comment: due to atrophic vaginitis  Other Topics Concern  . Not on file  Social History Narrative   Marital status: married x 52 yrs, moderately happily married, no abuse, moved from [place] in 2007.  Husband with infidelity in past and in 2013.      Children:2 children; 4 grandchildren;  live in [place]      Lives: with husband.      Employment: unemployed. Substitute teacher.        Tobacco: none       Alcohol:  None       Drugs: none      Exercise: Minimal      Caffeine use: coffee 1 serving/day.      Always uses seat belts, smoke alarm in the home, no guns in the home.      ADLs: independent; drives.      Living Will:  FULL CODE; no prolonged measures. Has living will.      Review of Systems: See HPI, otherwise negative ROS  Physical Exam: BP 131/65   Pulse 79   Temp (!) 97 F (36.1 C) (Tympanic)   Resp 16   Ht 5\' 8"  (1.727 m)   Wt 114.3 kg (252 lb)   SpO2 96%   BMI 38.32 kg/m  General:   Alert,  pleasant and cooperative in NAD Head:  Normocephalic and atraumatic. Neck:  Supple; no masses or thyromegaly. Lungs:  Clear throughout to auscultation.    Heart:  Regular rate and rhythm. Abdomen:  Soft, nontender and nondistended. Normal bowel sounds,  without guarding, and without rebound.  Neurologic:  Alert and  oriented x4;  grossly normal neurologically.  Impression/Plan: Gail Burns is here for an endoscopy and colonoscopy to be performed for diarrhea, PH colon polyps and gastritis and duodenitis.  Risks, benefits, limitations, and alternatives regarding  endoscopy and colonoscopy have been reviewed with the patient.  Questions have been answered.  All parties agreeable.   Gaylyn Cheers, MD  06/10/2017, 9:09 AM

## 2017-06-10 NOTE — Anesthesia Preprocedure Evaluation (Signed)
Anesthesia Evaluation  Patient identified by MRN, date of birth, ID band Patient awake    Reviewed: Allergy & Precautions, H&P , NPO status , Patient's Chart, lab work & pertinent test results  History of Anesthesia Complications Negative for: history of anesthetic complications  Airway Mallampati: III  TM Distance: <3 FB Neck ROM: limited    Dental  (+) Chipped   Pulmonary shortness of breath and with exertion, former smoker,           Cardiovascular Exercise Tolerance: Good hypertension, (-) angina+ CAD       Neuro/Psych PSYCHIATRIC DISORDERS Anxiety Depression negative neurological ROS  negative psych ROS   GI/Hepatic Neg liver ROS, GERD  Medicated and Controlled,  Endo/Other  negative endocrine ROS  Renal/GU Renal disease  negative genitourinary   Musculoskeletal  (+) Arthritis ,   Abdominal   Peds  Hematology negative hematology ROS (+)   Anesthesia Other Findings Past Medical History: No date: Anxiety No date: Basal cell carcinoma No date: Benign neoplasm of colon No date: Coronary atherosclerosis of native coronary artery No date: Degeneration of lumbar or lumbosacral intervertebral disc No date: Depression No date: Diarrhea No date: Diverticulosis of colon (without mention of hemorrhage) No date: Esophageal reflux No date: H/O acute pancreatitis No date: Hyperlipidemia No date: Hypertension No date: Impaired glucose tolerance test No date: Internal hemorrhoids without mention of complication No date: Macular degeneration No date: Obesity, unspecified No date: Other dyspnea and respiratory abnormality No date: Other malaise and fatigue 08/05/2012: Pancreatitis due to biliary obstruction     Comment:  two week admission in [place].  Step down unit. No date: Seborrheic dermatitis, unspecified No date: Tobacco use disorder No date: Unspecified disorder of kidney and ureter  Past Surgical  History: 08/05/2012: Admission     Comment:  Acute pancreatitis. [place]. Two week admission; step               down unit. 2006: CARDIAC CATHETERIZATION     Comment:  80% blockage No date: CHOLECYSTECTOMY No date: colonoscopy with polypectomy x 2: dilatation and curettage No date: [place] OF UTERUS 2012: eye lid surgery No date: EYE SURGERY     Comment:  per patient's health survey - LASER No date: KNEE SURGERY     Comment:  Right No date: Laser therapy and injections for macular degeneration 1982: TUBAL LIGATION  BMI    Body Mass Index:  38.32 kg/m      Reproductive/Obstetrics negative OB ROS                             Anesthesia Physical Anesthesia Plan  ASA: III  Anesthesia Plan: General   Post-op Pain Management:    Induction: Intravenous  PONV Risk Score and Plan: Propofol infusion  Airway Management Planned: Natural Airway and Nasal Cannula  Additional Equipment:   Intra-op Plan:   Post-operative Plan:   Informed Consent: I have reviewed the patients History and Physical, chart, labs and discussed the procedure including the risks, benefits and alternatives for the proposed anesthesia with the patient or authorized representative who has indicated his/her understanding and acceptance.   Dental Advisory Given  Plan Discussed with: Anesthesiologist, CRNA and Surgeon  Anesthesia Plan Comments: (Patient consented for risks of anesthesia including but not limited to:  - adverse reactions to medications - risk of intubation if required - damage to teeth, lips or other oral mucosa - sore throat or hoarseness -  Damage to heart, brain, lungs or loss of life  Patient voiced understanding.)        Anesthesia Quick Evaluation

## 2017-06-10 NOTE — Anesthesia Postprocedure Evaluation (Signed)
Anesthesia Post Note  Patient: MARELI ANTUNES  Procedure(s) Performed: COLONOSCOPY WITH PROPOFOL (N/A ) ESOPHAGOGASTRODUODENOSCOPY (EGD) WITH PROPOFOL (N/A )  Patient location during evaluation: Endoscopy Anesthesia Type: General Level of consciousness: awake and alert Pain management: pain level controlled Vital Signs Assessment: post-procedure vital signs reviewed and stable Respiratory status: spontaneous breathing, nonlabored ventilation, respiratory function stable and patient connected to nasal cannula oxygen Cardiovascular status: blood pressure returned to baseline and stable Postop Assessment: no apparent nausea or vomiting Anesthetic complications: no     Last Vitals:  Vitals:   06/10/17 1013 06/10/17 1026  BP: (!) 134/45 135/73  Pulse:    Resp:    Temp:    SpO2:      Last Pain:  Vitals:   06/10/17 0953  TempSrc: Tympanic                 Precious Haws Piscitello

## 2017-06-10 NOTE — Op Note (Signed)
[facility] Patient Name: Gail Burns Procedure Date: 06/10/2017 8:50 AM MRN: [id number] Account #: [id number] Date of Birth: [birth year] Admit Type: Outpatient Age: 72 Room: [facility] ENDO ROOM 3 Gender: Female Note Status: Finalized Procedure:            Upper GI endoscopy Indications:          Abnormal CT of the GI tract Providers:            Manya Silvas, MD Referring MD:         Renette Butters. Tamala Julian, MD (Referring MD) Medicines:            Propofol per Anesthesia Complications:        No immediate complications. Procedure:            Pre-Anesthesia Assessment:                       - After reviewing the risks and benefits, the patient                        was deemed in satisfactory condition to undergo the                        procedure.                       After obtaining informed consent, the endoscope was                        passed under direct vision. Throughout the procedure,                        the patient's blood pressure, pulse, and oxygen                        saturations were monitored continuously. The Endoscope                        was introduced through the mouth, and advanced to the                        second part of duodenum. The upper GI endoscopy was                        accomplished without difficulty. The patient tolerated                        the procedure well. Findings:      The examined esophagus was normal. GEJ had some inflammation.      Localized mild inflammation characterized by erythema, granularity and       linear erosions was found in the gastric antrum. Biopsies were taken       with a cold forceps for histology. Biopsies were taken with a cold       forceps for Helicobacter pylori testing.      Localized moderate inflammation characterized by erythema and       granularity was found in the duodenal bulb. Biopsies were taken with a       cold forceps for histology. Biopsies were taken with a  cold forceps for       Helicobacter pylori testing.  The second portion of the duodenum was normal. Impression:           - Normal esophagus.                       - Gastritis. Biopsied.                       - Duodenitis. Biopsied.                       - Normal second portion of the duodenum. Recommendation:       - Await pathology results. Take medicine. Manya Silvas, MD 06/10/2017 9:32:39 AM This report has been signed electronically. Number of Addenda: 0 Note Initiated On: 06/10/2017 8:50 AM      [facility]

## 2017-06-11 ENCOUNTER — Encounter: Payer: Self-pay | Admitting: Unknown Physician Specialty

## 2017-06-11 DIAGNOSIS — H353122 Nonexudative age-related macular degeneration, left eye, intermediate dry stage: Secondary | ICD-10-CM | POA: Diagnosis not present

## 2017-06-11 LAB — SURGICAL PATHOLOGY

## 2017-06-22 DIAGNOSIS — H353212 Exudative age-related macular degeneration, right eye, with inactive choroidal neovascularization: Secondary | ICD-10-CM | POA: Diagnosis not present

## 2017-06-22 DIAGNOSIS — H353122 Nonexudative age-related macular degeneration, left eye, intermediate dry stage: Secondary | ICD-10-CM | POA: Diagnosis not present

## 2017-06-24 ENCOUNTER — Other Ambulatory Visit: Payer: Self-pay

## 2017-06-24 ENCOUNTER — Ambulatory Visit (INDEPENDENT_AMBULATORY_CARE_PROVIDER_SITE_OTHER): Payer: Medicare Other | Admitting: Family Medicine

## 2017-06-24 ENCOUNTER — Encounter: Payer: Self-pay | Admitting: Family Medicine

## 2017-06-24 VITALS — BP 130/68 | HR 82 | Temp 97.6°F | Ht 68.0 in | Wt 253.2 lb

## 2017-06-24 DIAGNOSIS — F329 Major depressive disorder, single episode, unspecified: Secondary | ICD-10-CM | POA: Diagnosis not present

## 2017-06-24 DIAGNOSIS — E7800 Pure hypercholesterolemia, unspecified: Secondary | ICD-10-CM | POA: Diagnosis not present

## 2017-06-24 DIAGNOSIS — Z6838 Body mass index (BMI) 38.0-38.9, adult: Secondary | ICD-10-CM

## 2017-06-24 DIAGNOSIS — M5136 Other intervertebral disc degeneration, lumbar region: Secondary | ICD-10-CM | POA: Diagnosis not present

## 2017-06-24 DIAGNOSIS — M1712 Unilateral primary osteoarthritis, left knee: Secondary | ICD-10-CM | POA: Diagnosis not present

## 2017-06-24 DIAGNOSIS — K2900 Acute gastritis without bleeding: Secondary | ICD-10-CM

## 2017-06-24 DIAGNOSIS — I2510 Atherosclerotic heart disease of native coronary artery without angina pectoris: Secondary | ICD-10-CM

## 2017-06-24 DIAGNOSIS — R7309 Other abnormal glucose: Secondary | ICD-10-CM

## 2017-06-24 DIAGNOSIS — I10 Essential (primary) hypertension: Secondary | ICD-10-CM

## 2017-06-24 DIAGNOSIS — R61 Generalized hyperhidrosis: Secondary | ICD-10-CM | POA: Diagnosis not present

## 2017-06-24 MED ORDER — MELOXICAM 7.5 MG PO TABS: ORAL_TABLET | ORAL | 1 refills | Status: DC

## 2017-06-24 NOTE — Patient Instructions (Addendum)
GOAL: 20-30 GRAMS OF PROTEIN PER MEAL.   IF you received an x-ray today, you will receive an invoice from [facility]. Please contact [facility] at [phone] with questions or concerns regarding your invoice.   IF you received labwork today, you will receive an invoice from [facility]. Please contact [facility] at [phone] with questions or concerns regarding your invoice.   Our billing staff will not be able to assist you with questions regarding bills from these companies.  You will be contacted with the lab results as soon as they are available. The fastest way to get your results is to activate your My Chart account. Instructions are located on the last page of this paperwork. If you have not heard from [place] regarding the results in 2 weeks, please contact this office.      Exercising to Lose Weight Exercising can help you to lose weight. In order to lose weight through exercise, you need to do vigorous-intensity exercise. You can tell that you are exercising with vigorous intensity if you are breathing very hard and fast and cannot hold a conversation while exercising. Moderate-intensity exercise helps to maintain your current weight. You can tell that you are exercising at a moderate level if you have a higher heart rate and faster breathing, but you are still able to hold a conversation. How often should I exercise? Choose an activity that you enjoy and set realistic goals. Your health care provider can help you to make an activity plan that works for you. Exercise regularly as directed by your health care provider. This may include:  Doing resistance training twice each week, such as: ? Push-ups. ? Sit-ups. ? Lifting weights. ? Using resistance bands.  Doing a given intensity of exercise for a given amount of time. Choose from these options: ? 150 minutes of moderate-intensity exercise every week. ? 75 minutes of vigorous-intensity exercise every week. ? A  mix of moderate-intensity and vigorous-intensity exercise every week.  Children, pregnant women, people who are out of shape, people who are overweight, and older adults may need to consult a health care provider for individual recommendations. If you have any sort of medical condition, be sure to consult your health care provider before starting a new exercise program. What are some activities that can help me to lose weight?  Walking at a rate of at least 4.5 miles an hour.  Jogging or running at a rate of 5 miles per hour.  Biking at a rate of at least 10 miles per hour.  Lap swimming.  Roller-skating or in-line skating.  Cross-country skiing.  Vigorous competitive sports, such as football, basketball, and soccer.  Jumping rope.  Aerobic dancing. How can I be more active in my day-to-day activities?  Use the stairs instead of the elevator.  Take a walk during your lunch break.  If you drive, park your car farther away from work or school.  If you take public transportation, get off one stop early and walk the rest of the way.  Make all of your phone calls while standing up and walking around.  Get up, stretch, and walk around every 30 minutes throughout the day. What guidelines should I follow while exercising?  Do not exercise so much that you hurt yourself, feel dizzy, or get very short of breath.  Consult your health care provider prior to starting a new exercise program.  Wear comfortable clothes and shoes with good support.  Drink plenty of water while you exercise to prevent  dehydration or heat stroke. Body water is lost during exercise and must be replaced.  Work out until you breathe faster and your heart beats faster. This information is not intended to replace advice given to you by your health care provider. Make sure you discuss any questions you have with your health care provider. Document Released: 04/26/2010 Document Revised: 08/30/2015 Document  Reviewed: 08/25/2013 Elsevier Interactive Patient Education  [employer].

## 2017-06-24 NOTE — Progress Notes (Signed)
Subjective:    Patient ID: Gail Burns, female    DOB: 12/27/1945, 72 y.o.   MRN: [id number]  06/24/2017  Chronic Conditions (6 months )    HPI This 72 y.o. female presents for six month follow-up of hypertension, hypercholesterolemia, glucose intolerance, anxiety/depression.   Management changes made at last visit include the following:  -chronic diarrhea since cholecystectomy yet worsening diarrhea in past few months.  History of pancreatitis and patient worried about pancreatic cancer or colon cancer. -obtain labs with stool studies. -refer to GI; obtain CT abdomen and pelvis. -BRAT diet,hydration.  CT of abdomen and pelvis showed: -possible gastritis and duodenitis as there is wall thickening in the stomach and duodenum.  -pancreas is normal.  -plaque build up in the aorta blood vessel; controlling blood pressure, cholesterol, and sugar are very important to prevent progression of this plaque build up.   Update: S/p colonoscopy last week; two weeks ago, started Omeprazole 20mg  daily.  S/p EGD with gastritis; restarted Omeprazole.  Diarrhea now resolved. Dr. Nehemiah Massed follow-up with good report.  Cardiac catheterization and artery are stable.  Stopped Metoprolol.  Weaned slowly.  Has stopped Meloxicam; having back and knee pain.  Took one the other day and really helped.   BP Readings from Last 3 Encounters:  06/24/17 130/68  06/10/17 135/73  12/24/16 133/70   Wt Readings from Last 3 Encounters:  06/24/17 253 lb 3.2 oz (114.9 kg)  06/10/17 252 lb (114.3 kg)  12/24/16 250 lb (113.4 kg)   Immunization History  Administered Date(s) Administered  . Influenza Split 02/05/2011  . Influenza, Seasonal, Injecte, Preservative Fre 03/16/2012  . Influenza,inj,Quad PF,6+ Mos 12/21/2012, 04/26/2014, 04/24/2015, 04/09/2016, 12/24/2016  . Pneumococcal Conjugate-13 10/26/2013  . Pneumococcal Polysaccharide-23 10/25/2014  . Pneumococcal-Unspecified 07/11/2009  . Tdap 08/11/2007    . Zoster 04/07/2010    Review of Systems  Constitutional: Negative for chills, diaphoresis, fatigue and fever.  Eyes: Negative for visual disturbance.  Respiratory: Negative for cough and shortness of breath.   Cardiovascular: Negative for chest pain, palpitations and leg swelling.  Gastrointestinal: Negative for abdominal pain, constipation, diarrhea, nausea and vomiting.  Endocrine: Negative for cold intolerance, heat intolerance, polydipsia, polyphagia and polyuria.  Musculoskeletal: Positive for arthralgias and back pain.  Neurological: Negative for dizziness, tremors, seizures, syncope, facial asymmetry, speech difficulty, weakness, light-headedness, numbness and headaches.  Psychiatric/Behavioral: Negative for dysphoric mood. The patient is not nervous/anxious.     Past Medical History:  Diagnosis Date  . Anxiety   . Basal cell carcinoma   . Benign neoplasm of colon   . Coronary atherosclerosis of native coronary artery   . Degeneration of lumbar or lumbosacral intervertebral disc   . Depression   . Diarrhea   . Diverticulosis of colon (without mention of hemorrhage)   . Esophageal reflux   . H/O acute pancreatitis   . Hyperlipidemia   . Hypertension   . Impaired glucose tolerance test   . Internal hemorrhoids without mention of complication   . Macular degeneration   . Obesity, unspecified   . Other dyspnea and respiratory abnormality   . Other malaise and fatigue   . Pancreatitis due to biliary obstruction 08/05/2012   two week admission in [place].  Step down unit.  . Seborrheic dermatitis, unspecified   . Tobacco use disorder   . Unspecified disorder of kidney and ureter    Past Surgical History:  Procedure Laterality Date  . Admission  08/05/2012   Acute pancreatitis. [place]. Two week admission; step  down unit.  Marland Kitchen CARDIAC CATHETERIZATION  2006   80% blockage  . CHOLECYSTECTOMY    . colonoscopy with polypectomy    . COLONOSCOPY WITH PROPOFOL N/A 06/10/2017    Procedure: COLONOSCOPY WITH PROPOFOL;  Surgeon: Manya Silvas, MD;  Location: [facility] ENDOSCOPY;  Service: Endoscopy;  Laterality: N/A;  . dilatation and curettage  x 2  . DILATION AND CURETTAGE OF UTERUS    . ESOPHAGOGASTRODUODENOSCOPY (EGD) WITH PROPOFOL N/A 06/10/2017   Procedure: ESOPHAGOGASTRODUODENOSCOPY (EGD) WITH PROPOFOL;  Surgeon: Manya Silvas, MD;  Location: [facility] ENDOSCOPY;  Service: Endoscopy;  Laterality: N/A;  . eye lid surgery  2012  . EYE SURGERY     per patient's health survey - LASER  . KNEE SURGERY     Right  . Laser therapy and injections for macular degeneration    . TUBAL LIGATION  1982   Allergies  Allergen Reactions  . Augmentin [Amoxicillin-Pot Clavulanate] Hives   Current Outpatient Medications on File Prior to Visit  Medication Sig Dispense Refill  . aspirin 81 MG tablet Take 81 mg by mouth daily.    . calcium carbonate (OS-CAL) 600 MG TABS Take 600 mg by mouth 2 (two) times daily with a meal.    . escitalopram (LEXAPRO) 20 MG tablet Take 1 tablet (20 mg total) by mouth daily. 90 tablet 0  . fenofibrate 160 MG tablet Take 160 mg by mouth daily.    . Misc Natural Products (LUTEIN VISION BLEND PO) Take by mouth daily.    . Multiple Vitamin (MULTIVITAMIN) tablet Take 1 tablet by mouth daily.    Marland Kitchen omeprazole (PRILOSEC) 40 MG capsule Take 40 mg by mouth daily.    . rosuvastatin (CRESTOR) 40 MG tablet TAKE 1 TABLET DAILY 90 tablet 1   No current facility-administered medications on file prior to visit.    Social History   Socioeconomic History  . Marital status: Married    Spouse name: Not on file  . Number of children: 2  . Years of education: 25  . Highest education level: Not on file  Occupational History  . Occupation: substitute school teacher  Social Needs  . Financial resource strain: Not on file  . Food insecurity:    Worry: Not on file    Inability: Not on file  . Transportation needs:    Medical: Not on file    Non-medical: Not on file   Tobacco Use  . Smoking status: Former Smoker    Packs/day: 0.50    Years: 40.00    Pack years: 20.00    Types: Cigarettes    Last attempt to quit: 02/18/1990    Years since quitting: 27.4  . Smokeless tobacco: Never Used  . Tobacco comment: quit 1991  Substance and Sexual Activity  . Alcohol use: No    Comment: occasional twice a month;  per patient health survery form - No  . Drug use: No  . Sexual activity: Not Currently    Comment: due to atrophic vaginitis  Lifestyle  . Physical activity:    Days per week: Not on file    Minutes per session: Not on file  . Stress: Not on file  Relationships  . Social connections:    Talks on phone: Not on file    Gets together: Not on file    Attends religious service: Not on file    Active member of club or organization: Not on file    Attends meetings of clubs or organizations: Not on  file    Relationship status: Not on file  . Intimate partner violence:    Fear of current or ex partner: Not on file    Emotionally abused: Not on file    Physically abused: Not on file    Forced sexual activity: Not on file  Other Topics Concern  . Not on file  Social History Narrative   Marital status: married x 52 yrs, moderately happily married, no abuse, moved from [place] in 2007.  Husband with infidelity in past and in 2013.      Children:2 children; 4 grandchildren;  live in [place]      Lives: with husband.      Employment: unemployed. Substitute teacher.        Tobacco: none       Alcohol:  None       Drugs: none      Exercise: Minimal      Caffeine use: coffee 1 serving/day.      Always uses seat belts, smoke alarm in the home, no guns in the home.      ADLs: independent; drives.      Living Will:  FULL CODE; no prolonged measures. Has living will.     Family History  Problem Relation Age of Onset  . Cancer Mother   . Heart disease Mother          CAD     has pacemaker  . Dementia Mother   . Diabetes Mother   . Macular  degeneration Mother   . Heart disease Father   . Rheumatic fever Father   . Heart disease Sister 73       CABG  . Hyperlipidemia Sister   . Stroke Sister 45       TIA's  . Heart disease Brother        CABG age 24  . COPD Brother   . Heart disease Maternal Grandfather   . Hyperlipidemia Sister   . Hypertension Sister   . Arthritis Sister        DDD lumbar s/p surgery  . Parkinson's disease Maternal Grandmother   . Heart disease Paternal Grandmother        Objective:    BP 130/68 (BP Location: Left Arm, Patient Position: Sitting, Cuff Size: Normal)   Pulse 82   Temp 97.6 F (36.4 C) (Oral)   Ht 5\' 8"  (1.727 m)   Wt 253 lb 3.2 oz (114.9 kg)   SpO2 95%   BMI 38.50 kg/m  Physical Exam  Constitutional: She is oriented to person, place, and time. She appears well-developed and well-nourished. No distress.  HENT:  Head: Normocephalic and atraumatic.  Right Ear: External ear normal.  Left Ear: External ear normal.  Nose: Nose normal.  Mouth/Throat: Oropharynx is clear and moist.  Eyes: Conjunctivae and EOM are normal. Pupils are equal, round, and reactive to light.  Neck: Normal range of motion. Neck supple. Carotid bruit is not present. No thyromegaly present.  Cardiovascular: Normal rate, regular rhythm, normal heart sounds and intact distal pulses. Exam reveals no gallop and no friction rub.  No murmur heard. Pulmonary/Chest: Effort normal and breath sounds normal. She has no wheezes. She has no rales.  Abdominal: Soft. Bowel sounds are normal. She exhibits no distension and no mass. There is no tenderness. There is no rebound and no guarding.  Lymphadenopathy:    She has no cervical adenopathy.  Neurological: She is alert and oriented to person, place, and time. No cranial nerve  deficit.  Skin: Skin is warm and dry. No rash noted. She is not diaphoretic. No erythema. No pallor.  Psychiatric: She has a normal mood and affect. Her behavior is normal. Judgment and thought  content normal.   No results found. Depression screen [facility] 2/9 12/24/2016 10/29/2016 05/01/2016 04/09/2016 10/23/2015  Decreased Interest 0 0 0 0 0  Down, Depressed, Hopeless 0 0 0 0 0  PHQ - 2 Score 0 0 0 0 0   Fall Risk  12/24/2016 10/29/2016 05/01/2016 04/09/2016 10/23/2015  Falls in the past year? No No No No No  Comment - - - - -        Assessment & Plan:   1. Essential hypertension, benign   2. Pure hypercholesterolemia   3. Other abnormal glucose   4. Excessive sweating   5. Coronary artery disease involving native coronary artery of native heart without angina pectoris   6. Reactive depression   7. Acute superficial gastritis without hemorrhage   8. DDD (degenerative disc disease), lumbar   9. Primary osteoarthritis of left knee   10. Class 2 severe obesity due to excess calories with serious comorbidity and body mass index (BMI) of 38.0 to 38.9 in adult [facility])     Controlled chronic medical conditions; obtain labs for chronic disease management.  Excessive sweating: persistent; obtain TSH and free T4.  Likely secondary to obesity; recommend weight loss and exercise.  Gastritis: s/p EGD that revealed gastritis; treatment of gastritis with Omeprazole resolved diarrhea.  S/p stools studies, Ct abdomen/pelvis and EGD and GI consultation.  DDD lumbar spine and OA knee: chronic; uncontrolled without COX2 or NSAID use; discussed risk-benefit of PRN COX2 usage; patient requesting rx for Mobic; will monitor kidney function at every visit.   Obesity: Recommend weight loss, exercise for 30-60 minutes five days per week; recommend 1200 kcal restriction per day with a minimum of 60 grams of protein per day.  Eat 3 meals per day. Do not skip meals. Consider having a protein shake as a meal replacement to aid with eliminating meal skipping. Look for products with <220 calories, <7 gm sugar, and 20-30 gm protein.  Eat breakfast within 2 hours of getting up.   Make  your plate non-starchy  vegetables,  protein, and  carbohydrates at lunch and dinner.   Aim for at least 64 oz. of calorie-free beverages daily (water, Crystal Light, diet green tea, etc.). Eliminate any sugary beverages such as regular soda, sweet tea, or fruit juice.   Pay attention to hunger and fullness cues.  Stop eating once you feel satisfied; don't wait until you feel full, stuffed, or sick from eating.  Choose lean meats and low fat/fat free dairy products.  Choose foods high in fiber such as fruits, vegetables, and whole grains (brown rice, whole wheat pasta, whole wheat bread, etc.).  Limit foods with added sugar to <7 gm per serving.  Always eat in the kitchen/dining room.  Never eat in the bedroom or in front of the TV.    Orders Placed This Encounter  Procedures  . CBC with Differential/Platelet  . Hemoglobin A1c  . Comprehensive metabolic panel    Order Specific Question:   Has the patient fasted?    Answer:   No  . Lipid panel    Order Specific Question:   Has the patient fasted?    Answer:   No  . T4, free  . TSH   Meds ordered this encounter  Medications  . meloxicam (MOBIC)  7.5 MG tablet    Sig: TAKE 1 TABLET DAILY    Dispense:  90 tablet    Refill:  1    Return in about 6 months (around 12/25/2017) for complete physical examiniation.   Gail Burns, M.D. Primary Care at [facility] previously [facility] [address] [place], [place]  [address] [phone] phone [phone] fax

## 2017-06-25 LAB — COMPREHENSIVE METABOLIC PANEL
A/G RATIO: 1.8 (ref 1.2–2.2)
ALBUMIN: 4.4 g/dL (ref 3.5–4.8)
ALT: 15 IU/L (ref 0–32)
AST: 18 IU/L (ref 0–40)
Alkaline Phosphatase: 58 IU/L (ref 39–117)
BUN / CREAT RATIO: 21 (ref 12–28)
BUN: 22 mg/dL (ref 8–27)
Bilirubin Total: 0.4 mg/dL (ref 0.0–1.2)
CALCIUM: 9.9 mg/dL (ref 8.7–10.3)
CO2: 24 mmol/L (ref 20–29)
Chloride: 104 mmol/L (ref 96–106)
Creatinine, Ser: 1.04 mg/dL — ABNORMAL HIGH (ref 0.57–1.00)
GFR, EST AFRICAN AMERICAN: 62 mL/min/{1.73_m2} (ref >59)
GFR, EST NON AFRICAN AMERICAN: 53 mL/min/{1.73_m2} — AB (ref >59)
GLUCOSE: 124 mg/dL — AB (ref 65–99)
Globulin, Total: 2.4 g/dL (ref 1.5–4.5)
Potassium: 5.5 mmol/L — ABNORMAL HIGH (ref 3.5–5.2)
Sodium: 143 mmol/L (ref 134–144)
TOTAL PROTEIN: 6.8 g/dL (ref 6.0–8.5)

## 2017-06-25 LAB — LIPID PANEL
CHOL/HDL RATIO: 3.5 ratio (ref 0.0–4.4)
Cholesterol, Total: 172 mg/dL (ref 100–199)
HDL: 49 mg/dL (ref >39)
LDL CALC: 102 mg/dL — AB (ref 0–99)
Triglycerides: 106 mg/dL (ref 0–149)
VLDL CHOLESTEROL CAL: 21 mg/dL (ref 5–40)

## 2017-06-25 LAB — CBC WITH DIFFERENTIAL/PLATELET
BASOS: 0 %
Basophils Absolute: 0 10*3/uL (ref 0.0–0.2)
EOS (ABSOLUTE): 0.1 10*3/uL (ref 0.0–0.4)
EOS: 3 %
HEMATOCRIT: 39.7 % (ref 34.0–46.6)
Hemoglobin: 13.1 g/dL (ref 11.1–15.9)
Immature Grans (Abs): 0 10*3/uL (ref 0.0–0.1)
Immature Granulocytes: 0 %
LYMPHS ABS: 1.6 10*3/uL (ref 0.7–3.1)
Lymphs: 31 %
MCH: 29.2 pg (ref 26.6–33.0)
MCHC: 33 g/dL (ref 31.5–35.7)
MCV: 89 fL (ref 79–97)
MONOS ABS: 0.6 10*3/uL (ref 0.1–0.9)
Monocytes: 12 %
Neutrophils Absolute: 2.8 10*3/uL (ref 1.4–7.0)
Neutrophils: 54 %
Platelets: 336 10*3/uL (ref 150–379)
RBC: 4.48 x10E6/uL (ref 3.77–5.28)
RDW: 14 % (ref 12.3–15.4)
WBC: 5.1 10*3/uL (ref 3.4–10.8)

## 2017-06-25 LAB — HEMOGLOBIN A1C
Est. average glucose Bld gHb Est-mCnc: 143 mg/dL
Hgb A1c MFr Bld: 6.6 % — ABNORMAL HIGH (ref 4.8–5.6)

## 2017-06-25 LAB — T4, FREE: FREE T4: 1.26 ng/dL (ref 0.82–1.77)

## 2017-06-25 LAB — TSH: TSH: 3.85 u[IU]/mL (ref 0.450–4.500)

## 2017-07-11 DIAGNOSIS — H353122 Nonexudative age-related macular degeneration, left eye, intermediate dry stage: Secondary | ICD-10-CM | POA: Diagnosis not present

## 2017-08-10 DIAGNOSIS — H353122 Nonexudative age-related macular degeneration, left eye, intermediate dry stage: Secondary | ICD-10-CM | POA: Diagnosis not present

## 2017-08-24 DIAGNOSIS — K2990 Gastroduodenitis, unspecified, without bleeding: Secondary | ICD-10-CM | POA: Diagnosis not present

## 2017-08-24 DIAGNOSIS — K219 Gastro-esophageal reflux disease without esophagitis: Secondary | ICD-10-CM | POA: Diagnosis not present

## 2017-09-01 ENCOUNTER — Other Ambulatory Visit: Payer: Self-pay | Admitting: Family Medicine

## 2017-09-02 ENCOUNTER — Encounter: Payer: Self-pay | Admitting: Family Medicine

## 2017-09-09 DIAGNOSIS — H353122 Nonexudative age-related macular degeneration, left eye, intermediate dry stage: Secondary | ICD-10-CM | POA: Diagnosis not present

## 2017-09-10 ENCOUNTER — Telehealth: Payer: Self-pay | Admitting: Family Medicine

## 2017-09-10 DIAGNOSIS — F411 Generalized anxiety disorder: Secondary | ICD-10-CM

## 2017-09-10 MED ORDER — ESCITALOPRAM OXALATE 20 MG PO TABS: 20.0000 mg | ORAL_TABLET | Freq: Every day | ORAL | 0 refills | Status: AC

## 2017-09-10 NOTE — Telephone Encounter (Signed)
Copied from [place] [phone]. Topic: Quick Communication - Rx Refill/Question >> Sep 10, 2017  3:43 PM Percell Belt A wrote: Medication: Medication  escitalopram (LEXAPRO) 20 MG tablet [[id number]]    Has the patient contacted their pharmacy? Yes  (Agent: If no, request that the patient contact the pharmacy for the refill.) (Agent: If yes, when and what did the pharmacy advise?)  Preferred Pharmacy (with phone number or street name): [facility] [place], [place] to [facility] [phone] (Phone)   Agent: Please be advised that RX refills may take up to 3 business days. We ask that you follow-up with your pharmacy.

## 2017-09-21 DIAGNOSIS — D485 Neoplasm of uncertain behavior of skin: Secondary | ICD-10-CM | POA: Diagnosis not present

## 2017-09-21 DIAGNOSIS — D0471 Carcinoma in situ of skin of right lower limb, including hip: Secondary | ICD-10-CM | POA: Diagnosis not present

## 2017-09-21 DIAGNOSIS — L218 Other seborrheic dermatitis: Secondary | ICD-10-CM | POA: Diagnosis not present

## 2017-09-21 DIAGNOSIS — L820 Inflamed seborrheic keratosis: Secondary | ICD-10-CM | POA: Diagnosis not present

## 2017-09-21 DIAGNOSIS — L570 Actinic keratosis: Secondary | ICD-10-CM | POA: Diagnosis not present

## 2017-09-21 DIAGNOSIS — C44529 Squamous cell carcinoma of skin of other part of trunk: Secondary | ICD-10-CM | POA: Diagnosis not present

## 2017-09-28 DIAGNOSIS — C44529 Squamous cell carcinoma of skin of other part of trunk: Secondary | ICD-10-CM | POA: Diagnosis not present

## 2017-09-28 DIAGNOSIS — L815 Leukoderma, not elsewhere classified: Secondary | ICD-10-CM | POA: Diagnosis not present

## 2017-09-28 DIAGNOSIS — C44722 Squamous cell carcinoma of skin of right lower limb, including hip: Secondary | ICD-10-CM | POA: Diagnosis not present

## 2017-09-28 DIAGNOSIS — L565 Disseminated superficial actinic porokeratosis (DSAP): Secondary | ICD-10-CM | POA: Diagnosis not present

## 2017-09-29 ENCOUNTER — Other Ambulatory Visit: Payer: Self-pay | Admitting: Family Medicine

## 2017-09-29 NOTE — Telephone Encounter (Signed)
Copied from [place] [phone]. Topic: Quick Communication - Rx Refill/Question >> Sep 29, 2017 11:45 AM Ahmed Prima L wrote: Medication: fenofibrate 160 MG tablet  Has the patient contacted their pharmacy? yes (Agent: If no, request that the patient contact the pharmacy for the refill.) (Agent: If yes, when and what did the pharmacy advise?)  Preferred Pharmacy (with phone number or street name):  [facility] [place], [place] to [facility] Agent: Please be advised that RX refills may take up to 3 business days. We ask that you follow-up with your pharmacy.

## 2017-09-30 NOTE — Telephone Encounter (Signed)
LOV  06/24/17 Dr. Tamala Julian Dr. Thompson Caul name not on medication on med list

## 2017-10-03 NOTE — Telephone Encounter (Signed)
Message sent to Dr. Tamala Julian re: fenofibrate

## 2017-10-04 MED ORDER — FENOFIBRATE 160 MG PO TABS: 160.0000 mg | ORAL_TABLET | Freq: Every day | ORAL | 3 refills | Status: DC

## 2017-11-23 DIAGNOSIS — H353122 Nonexudative age-related macular degeneration, left eye, intermediate dry stage: Secondary | ICD-10-CM | POA: Diagnosis not present

## 2017-11-30 DIAGNOSIS — I2510 Atherosclerotic heart disease of native coronary artery without angina pectoris: Secondary | ICD-10-CM | POA: Diagnosis not present

## 2017-11-30 DIAGNOSIS — E782 Mixed hyperlipidemia: Secondary | ICD-10-CM | POA: Diagnosis not present

## 2017-11-30 DIAGNOSIS — I10 Essential (primary) hypertension: Secondary | ICD-10-CM | POA: Diagnosis not present

## 2017-12-10 ENCOUNTER — Telehealth: Payer: Self-pay | Admitting: Family Medicine

## 2017-12-10 ENCOUNTER — Other Ambulatory Visit: Payer: Self-pay | Admitting: *Deleted

## 2017-12-10 DIAGNOSIS — F411 Generalized anxiety disorder: Secondary | ICD-10-CM

## 2017-12-10 NOTE — Telephone Encounter (Signed)
Copied from [place] [phone]. Topic: Quick Communication - Rx Refill/Question >> Dec 10, 2017  4:26 PM Selinda Flavin B, NT wrote: Medication: escitalopram (LEXAPRO) 20 MG tablet   Has the patient contacted their pharmacy? Yes.   (Agent: If no, request that the patient contact the pharmacy for the refill.) (Agent: If yes, when and what did the pharmacy advise?)  Preferred Pharmacy (with phone number or street name): [facility] [place], [place]: Please be advised that RX refills may take up to 3 business days. We ask that you follow-up with your pharmacy.

## 2017-12-10 NOTE — Telephone Encounter (Signed)
Pt was called regarding her request for a refill for lexapro. Pt did not want to schedule an appointment with provider at [facility]. She said that she would follow [facility] to her new location and get her refill done there.

## 2017-12-23 DIAGNOSIS — H353122 Nonexudative age-related macular degeneration, left eye, intermediate dry stage: Secondary | ICD-10-CM | POA: Diagnosis not present

## 2017-12-28 DIAGNOSIS — H353212 Exudative age-related macular degeneration, right eye, with inactive choroidal neovascularization: Secondary | ICD-10-CM | POA: Diagnosis not present

## 2018-01-18 DIAGNOSIS — L814 Other melanin hyperpigmentation: Secondary | ICD-10-CM | POA: Diagnosis not present

## 2018-01-18 DIAGNOSIS — Z85828 Personal history of other malignant neoplasm of skin: Secondary | ICD-10-CM | POA: Diagnosis not present

## 2018-01-18 DIAGNOSIS — L820 Inflamed seborrheic keratosis: Secondary | ICD-10-CM | POA: Diagnosis not present

## 2018-01-22 DIAGNOSIS — H353122 Nonexudative age-related macular degeneration, left eye, intermediate dry stage: Secondary | ICD-10-CM | POA: Diagnosis not present

## 2018-01-26 DIAGNOSIS — H353122 Nonexudative age-related macular degeneration, left eye, intermediate dry stage: Secondary | ICD-10-CM | POA: Diagnosis not present

## 2018-01-26 DIAGNOSIS — H25813 Combined forms of age-related cataract, bilateral: Secondary | ICD-10-CM | POA: Diagnosis not present

## 2018-01-26 DIAGNOSIS — H353212 Exudative age-related macular degeneration, right eye, with inactive choroidal neovascularization: Secondary | ICD-10-CM | POA: Diagnosis not present

## 2018-02-15 DIAGNOSIS — H353212 Exudative age-related macular degeneration, right eye, with inactive choroidal neovascularization: Secondary | ICD-10-CM | POA: Diagnosis not present

## 2018-02-19 DIAGNOSIS — E782 Mixed hyperlipidemia: Secondary | ICD-10-CM | POA: Diagnosis not present

## 2018-02-19 DIAGNOSIS — N289 Disorder of kidney and ureter, unspecified: Secondary | ICD-10-CM | POA: Diagnosis not present

## 2018-02-19 DIAGNOSIS — R7309 Other abnormal glucose: Secondary | ICD-10-CM | POA: Diagnosis not present

## 2018-02-19 DIAGNOSIS — I2510 Atherosclerotic heart disease of native coronary artery without angina pectoris: Secondary | ICD-10-CM | POA: Diagnosis not present

## 2018-02-19 DIAGNOSIS — F341 Dysthymic disorder: Secondary | ICD-10-CM | POA: Diagnosis not present

## 2018-02-19 DIAGNOSIS — Z23 Encounter for immunization: Secondary | ICD-10-CM | POA: Diagnosis not present

## 2018-02-19 DIAGNOSIS — M5136 Other intervertebral disc degeneration, lumbar region: Secondary | ICD-10-CM | POA: Diagnosis not present

## 2018-02-19 DIAGNOSIS — I10 Essential (primary) hypertension: Secondary | ICD-10-CM | POA: Diagnosis not present

## 2018-02-21 DIAGNOSIS — H353122 Nonexudative age-related macular degeneration, left eye, intermediate dry stage: Secondary | ICD-10-CM | POA: Diagnosis not present

## 2018-02-22 DIAGNOSIS — Z7982 Long term (current) use of aspirin: Secondary | ICD-10-CM | POA: Diagnosis not present

## 2018-02-22 DIAGNOSIS — Z880 Allergy status to penicillin: Secondary | ICD-10-CM | POA: Diagnosis not present

## 2018-02-22 DIAGNOSIS — Z961 Presence of intraocular lens: Secondary | ICD-10-CM | POA: Insufficient documentation

## 2018-02-22 DIAGNOSIS — Z79899 Other long term (current) drug therapy: Secondary | ICD-10-CM | POA: Diagnosis not present

## 2018-02-22 DIAGNOSIS — Z87891 Personal history of nicotine dependence: Secondary | ICD-10-CM | POA: Diagnosis not present

## 2018-02-22 DIAGNOSIS — H25811 Combined forms of age-related cataract, right eye: Secondary | ICD-10-CM | POA: Diagnosis not present

## 2018-02-22 DIAGNOSIS — H52221 Regular astigmatism, right eye: Secondary | ICD-10-CM | POA: Diagnosis not present

## 2018-03-23 DIAGNOSIS — H353122 Nonexudative age-related macular degeneration, left eye, intermediate dry stage: Secondary | ICD-10-CM | POA: Diagnosis not present

## 2018-03-29 DIAGNOSIS — H353122 Nonexudative age-related macular degeneration, left eye, intermediate dry stage: Secondary | ICD-10-CM | POA: Diagnosis not present

## 2018-03-29 DIAGNOSIS — H353212 Exudative age-related macular degeneration, right eye, with inactive choroidal neovascularization: Secondary | ICD-10-CM | POA: Diagnosis not present

## 2018-09-02 IMAGING — MG MM DIGITAL SCREENING BILAT W/ TOMO W/ CAD
8 of 12 series · 8 of 28 positions shown · non-contrast
Comparison: Previous exam(s).

CLINICAL DATA: Screening.

EXAM:
2D DIGITAL SCREENING BILATERAL MAMMOGRAM WITH CAD AND ADJUNCT TOMO

[R CC synth-2D]
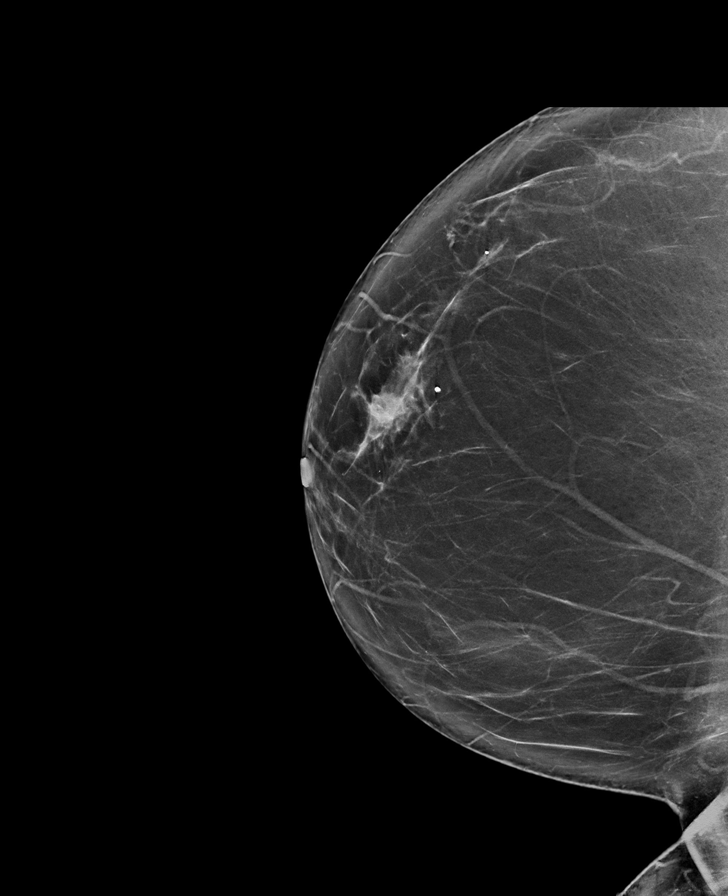

[R CC]
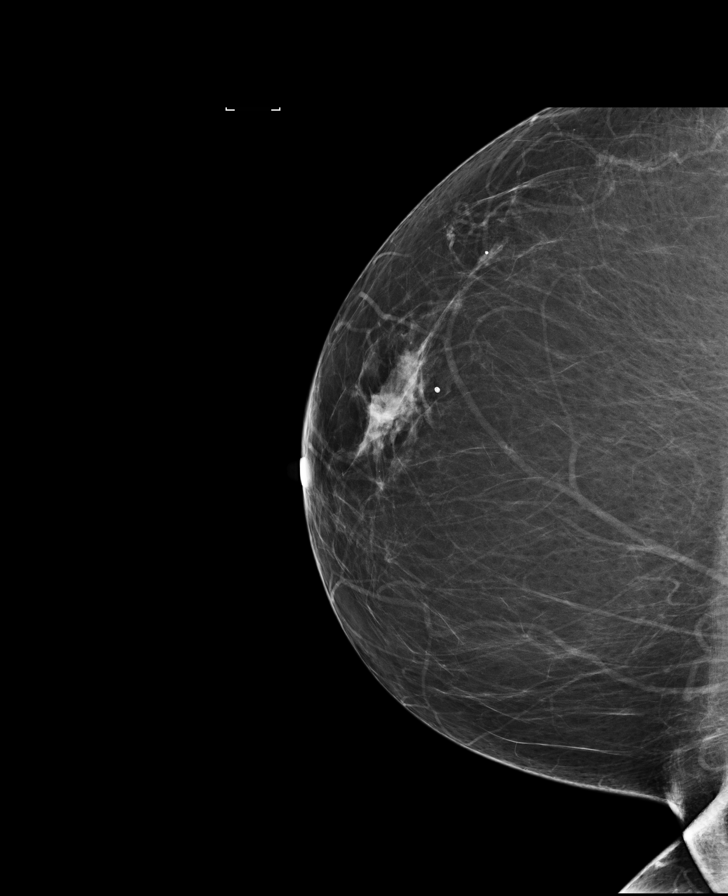

[R MLO]
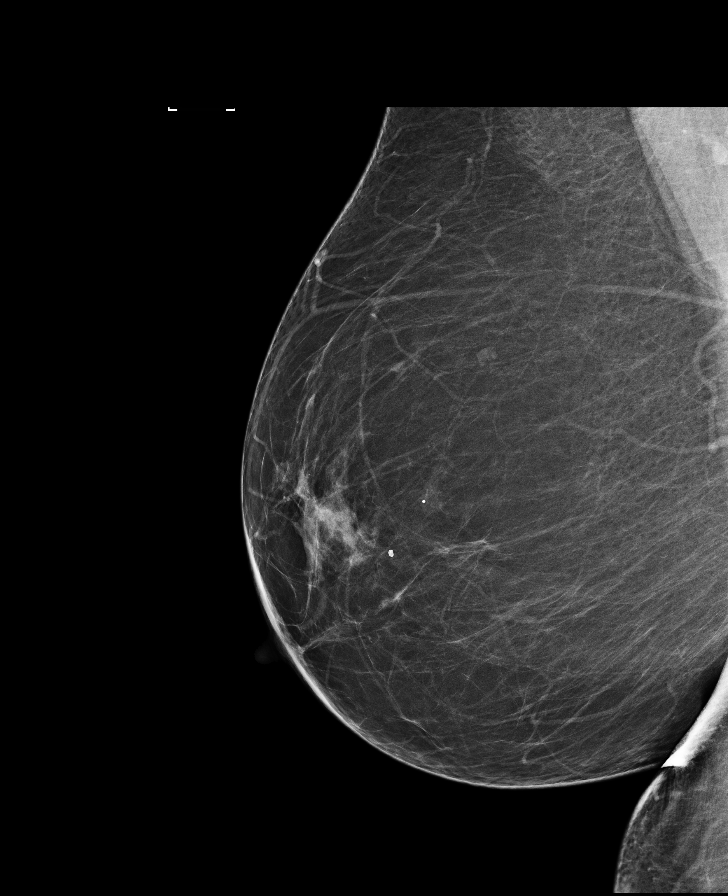

[L CC synth-2D]
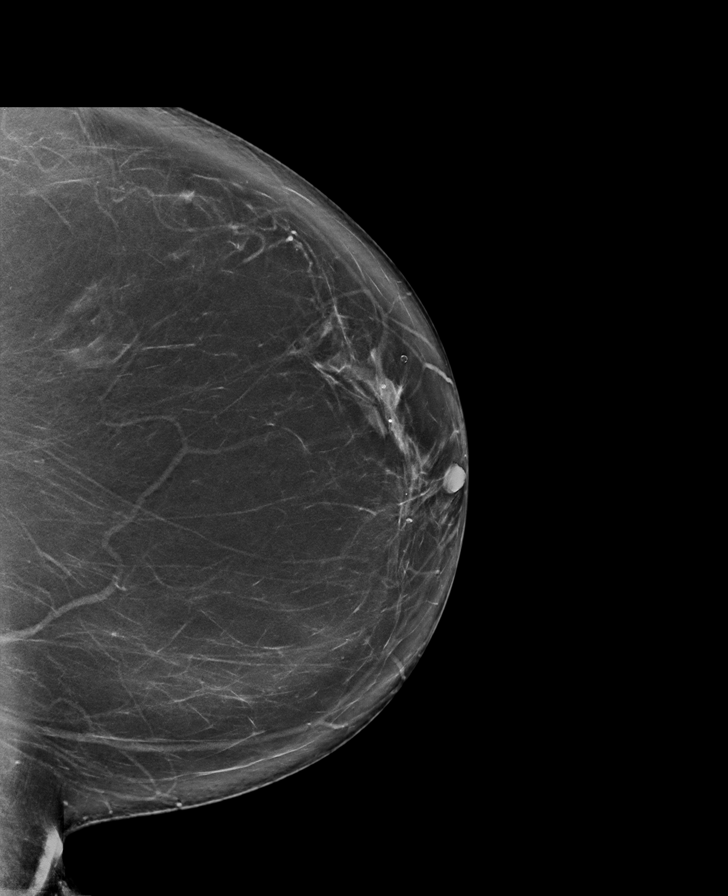

[L CC]
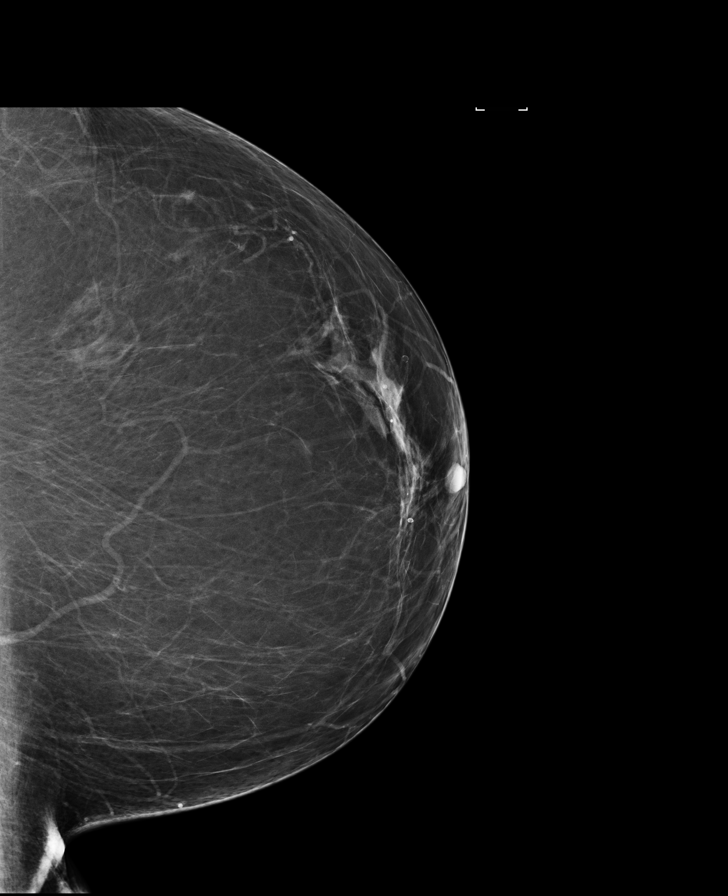

[R MLO synth-2D]
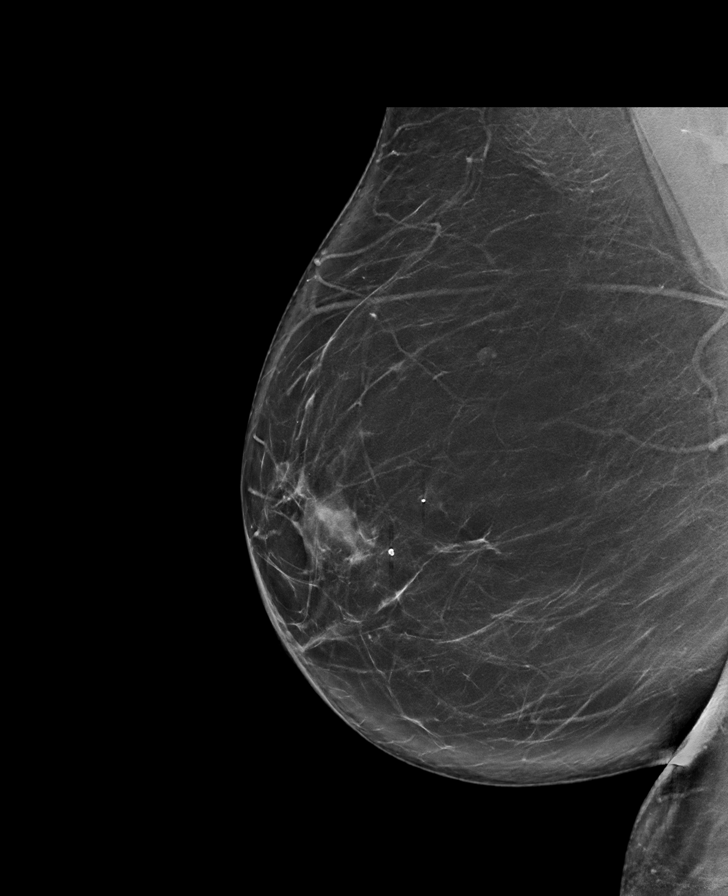

[L MLO]
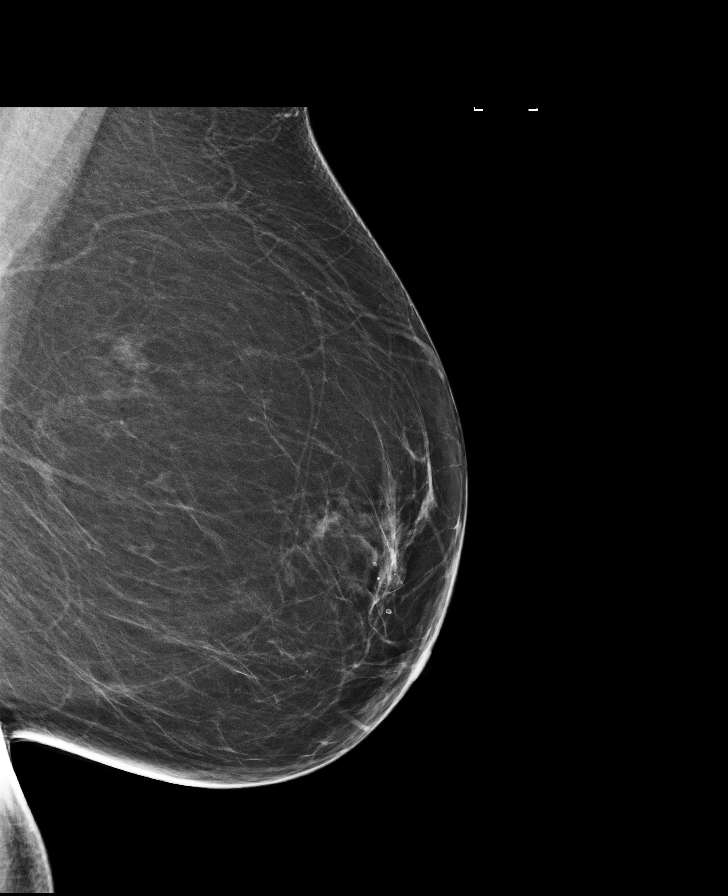

[L MLO synth-2D]
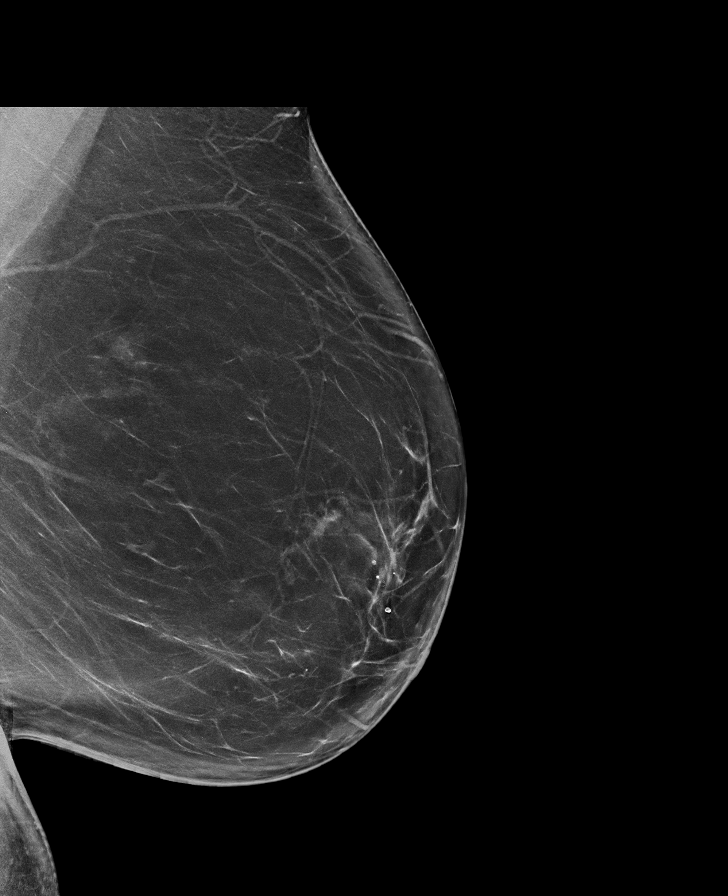

[8 of 28 positions shown; findings below may reference images not displayed]

ACR Breast Density Category b: There are scattered areas of
fibroglandular density.
FINDINGS: There are no findings suspicious for malignancy. Images were
processed with CAD.
IMPRESSION: No mammographic evidence of malignancy. A result letter of this
screening mammogram will be mailed directly to the patient.

RECOMMENDATION:
Screening mammogram in one year. (Code:97-6-RS4)

BI-RADS CATEGORY  1: Negative.

## 2018-09-08 ENCOUNTER — Other Ambulatory Visit: Payer: Self-pay | Admitting: Family Medicine

## 2018-09-08 DIAGNOSIS — Z1231 Encounter for screening mammogram for malignant neoplasm of breast: Secondary | ICD-10-CM

## 2019-02-02 ENCOUNTER — Inpatient Hospital Stay: Admission: RE | Admit: 2019-02-02 | Payer: Medicare Other | Source: Ambulatory Visit

## 2019-05-31 ENCOUNTER — Ambulatory Visit
Admission: RE | Admit: 2019-05-31 | Discharge: 2019-05-31 | Disposition: A | Discharge status: Home / Self Care | Payer: Medicare Other | Source: Ambulatory Visit | Attending: Family Medicine | Admitting: Family Medicine

## 2019-05-31 DIAGNOSIS — Z1231 Encounter for screening mammogram for malignant neoplasm of breast: Secondary | ICD-10-CM | POA: Diagnosis not present

## 2019-06-02 ENCOUNTER — Other Ambulatory Visit: Payer: Self-pay | Admitting: Family Medicine

## 2019-06-02 DIAGNOSIS — R921 Mammographic calcification found on diagnostic imaging of breast: Secondary | ICD-10-CM

## 2019-06-02 DIAGNOSIS — R928 Other abnormal and inconclusive findings on diagnostic imaging of breast: Secondary | ICD-10-CM

## 2019-06-09 ENCOUNTER — Ambulatory Visit
Admission: RE | Admit: 2019-06-09 | Discharge: 2019-06-09 | Disposition: A | Discharge status: Home / Self Care | Payer: Medicare Other | Source: Ambulatory Visit | Attending: Family Medicine | Admitting: Family Medicine

## 2019-06-09 DIAGNOSIS — R921 Mammographic calcification found on diagnostic imaging of breast: Secondary | ICD-10-CM | POA: Diagnosis present

## 2019-06-09 DIAGNOSIS — R928 Other abnormal and inconclusive findings on diagnostic imaging of breast: Secondary | ICD-10-CM | POA: Diagnosis present

## 2019-06-13 ENCOUNTER — Other Ambulatory Visit: Payer: Self-pay | Admitting: Family Medicine

## 2019-06-13 DIAGNOSIS — R928 Other abnormal and inconclusive findings on diagnostic imaging of breast: Secondary | ICD-10-CM

## 2019-06-13 DIAGNOSIS — R921 Mammographic calcification found on diagnostic imaging of breast: Secondary | ICD-10-CM

## 2020-05-20 DIAGNOSIS — M1711 Unilateral primary osteoarthritis, right knee: Secondary | ICD-10-CM | POA: Insufficient documentation

## 2020-07-09 DIAGNOSIS — I708 Atherosclerosis of other arteries: Secondary | ICD-10-CM | POA: Insufficient documentation

## 2020-07-09 DIAGNOSIS — I700 Atherosclerosis of aorta: Secondary | ICD-10-CM | POA: Insufficient documentation

## 2020-08-24 NOTE — Discharge Instructions (Signed)
Instructions after Total Knee Replacement   Gail Burns, Jr., M.D.     Dept. of Orthopaedics & Sports Medicine  [facility]  [address]  [place], [place]  [address]  Phone: [phone]   Fax: [phone]    DIET: Drink plenty of non-alcoholic fluids. Resume your normal diet. Include foods high in fiber.  ACTIVITY:  You may use crutches or a walker with weight-bearing as tolerated, unless instructed otherwise. You may be weaned off of the walker or crutches by your Physical Therapist.  Do NOT place pillows under the knee. Anything placed under the knee could limit your ability to straighten the knee.   Continue doing gentle exercises. Exercising will reduce the pain and swelling, increase motion, and prevent muscle weakness.   Please continue to use the TED compression stockings for 6 weeks. You may remove the stockings at night, but should reapply them in the morning. Do not drive or operate any equipment until instructed.  WOUND CARE:  Continue to use the PolarCare or ice packs periodically to reduce pain and swelling. You may bathe or shower after the staples are removed at the first office visit following surgery.  MEDICATIONS: You may resume your regular medications. Please take the pain medication as prescribed on the medication. Do not take pain medication on an empty stomach. You have been given a prescription for a blood thinner (Lovenox or Coumadin). Please take the medication as instructed. (NOTE: After completing a 2 week course of Lovenox, take one Enteric-coated aspirin once a day. This along with elevation will help reduce the possibility of phlebitis in your operated leg.) Do not drive or drink alcoholic beverages when taking pain medications.  CALL THE OFFICE FOR: Temperature above 101 degrees Excessive bleeding or drainage on the dressing. Excessive swelling, coldness, or paleness of the toes. Persistent nausea and vomiting.  FOLLOW-UP:  You  should have an appointment to return to the office in 10-14 days after surgery. Arrangements have been made for continuation of Physical Therapy (either home therapy or outpatient therapy).   [facility] Department Directory         [email or web address]       [email or web address]          Cardiology  Appointments: [place] - [phone] [place] - [phone]  Endocrinology  Appointments: [place] - [phone] [place] - [phone]  Gastroenterology  Appointments: [place] - [phone] [place] - [phone]        General Surgery   Appointments: [place] - [phone]  Internal Medicine/Family Medicine  Appointments: [place] - [phone] [place] - [phone] [place] - [phone]  Metabolic and Weigh Loss Surgery  Appointments: [place] - [phone]        Neurology  Appointments: [place] - [phone] [place] - [phone]  Neurosurgery  Appointments: [place] - [phone]  Obstetrics & Gynecology  Appointments: [place] - [phone] [place] - [phone]        Pediatrics  Appointments: [place] - [phone] [place] - [phone]  Physiatry  Appointments: [place] -[phone]  Physical Therapy  Appointments: [place] - [phone] [place] - [phone]        Podiatry  Appointments: [place] - [phone] [place] - [phone]  Pulmonology  Appointments: [place] - [phone]  Rheumatology  Appointments:  - [phone]         Location: [facility]  [address] , [place]  [address]  [place] Location: [facility] [address] [place], [place]  [address]  [place] Location: [facility] [address] [place], [place]  [address]    

## 2020-08-29 ENCOUNTER — Other Ambulatory Visit: Payer: Self-pay

## 2020-08-29 ENCOUNTER — Encounter
Admission: RE | Admit: 2020-08-29 | Discharge: 2020-08-29 | Disposition: A | Discharge status: Home / Self Care | Payer: Medicare Other | Source: Ambulatory Visit | Attending: Orthopedic Surgery | Admitting: Orthopedic Surgery

## 2020-08-29 DIAGNOSIS — Z01818 Encounter for other preprocedural examination: Secondary | ICD-10-CM | POA: Insufficient documentation

## 2020-08-29 HISTORY — DX: Family history of other specified conditions: Z84.89

## 2020-08-29 HISTORY — DX: Unspecified osteoarthritis, unspecified site: M19.90

## 2020-08-29 LAB — URINALYSIS, ROUTINE W REFLEX MICROSCOPIC
Bilirubin Urine: NEGATIVE
Glucose, UA: NEGATIVE mg/dL
Hgb urine dipstick: NEGATIVE
Ketones, ur: NEGATIVE mg/dL
Leukocytes,Ua: NEGATIVE
Nitrite: NEGATIVE
Protein, ur: NEGATIVE mg/dL
Specific Gravity, Urine: 1.012 (ref 1.005–1.030)
pH: 7 (ref 5.0–8.0)

## 2020-08-29 LAB — PROTIME-INR
INR: 1 (ref 0.8–1.2)
Prothrombin Time: 12.9 seconds (ref 11.4–15.2)

## 2020-08-29 LAB — TYPE AND SCREEN
ABO/RH(D): O POS
Antibody Screen: NEGATIVE

## 2020-08-29 LAB — SURGICAL PCR SCREEN
MRSA, PCR: NEGATIVE
Staphylococcus aureus: NEGATIVE

## 2020-08-29 LAB — APTT: aPTT: 35 seconds (ref 24–36)

## 2020-08-29 LAB — SEDIMENTATION RATE: Sed Rate: 28 mm/hr (ref 0–30)

## 2020-08-29 LAB — C-REACTIVE PROTEIN: CRP: 0.6 mg/dL (ref <1.0)

## 2020-08-29 NOTE — Patient Instructions (Addendum)
Your procedure is scheduled on:  Monday, June 6 Report to the Registration Desk on the 1st floor of the [facility]. To find out your arrival time, please call [phone] between 1PM - 3PM on: Friday, June 3  REMEMBER: Instructions that are not followed completely may result in serious medical risk, up to and including death; or upon the discretion of your surgeon and anesthesiologist your surgery may need to be rescheduled.  Do not eat food after midnight the night before surgery.  No gum chewing, lozengers or hard candies.  You may however, drink CLEAR liquids up to 2 hours before you are scheduled to arrive for your surgery. Do not drink anything within 2 hours of your scheduled arrival time.  Clear liquids include: - water  - apple juice without pulp - gatorade (not RED, PURPLE, OR BLUE) - black coffee or tea (Do NOT add milk or creamers to the coffee or tea) Do NOT drink anything that is not on this list.  In addition, your doctor has ordered for you to drink the provided  Ensure Pre-Surgery Clear Carbohydrate Drink  Drinking this carbohydrate drink up to two hours before surgery helps to reduce insulin resistance and improve patient outcomes. Please complete drinking 2 hours prior to scheduled arrival time.  TAKE THESE MEDICATIONS THE MORNING OF SURGERY WITH A SIP OF WATER:  1.  Escitalopram (Lexapro) 2.  Ezetimibe (Zetia) 3.  Omeprazole (Prilosec) -(take one the night before and one on the morning of surgery - helps to prevent nausea after surgery.)   One week prior to surgery: starting May 30 Stop aspirin and Anti-inflammatories (NSAIDS) such as Advil, Aleve, Ibuprofen, Motrin, Naproxen, Naprosyn and Aspirin based products such as Excedrin, Goodys Powder, BC Powder. Stop ANY OVER THE COUNTER supplements until after surgery. (calcium, lutein, multiple vitamin, cell wise) You may however, continue to take Tylenol if needed for pain up until the day of surgery.  No Alcohol  for 24 hours before or after surgery.  No Smoking including e-cigarettes for 24 hours prior to surgery.  No chewable tobacco products for at least 6 hours prior to surgery.  No nicotine patches on the day of surgery.  Do not use any "recreational" drugs for at least a week prior to your surgery.  Please be advised that the combination of cocaine and anesthesia may have negative outcomes, up to and including death. If you test positive for cocaine, your surgery will be cancelled.  On the morning of surgery brush your teeth with toothpaste and water, you may rinse your mouth with mouthwash if you wish. Do not swallow any toothpaste or mouthwash.  Do not wear jewelry, make-up, hairpins, clips or nail polish.  Do not wear lotions, powders, or perfumes.   Do not shave body from the neck down 48 hours prior to surgery just in case you cut yourself which could leave a site for infection.  Also, freshly shaved skin may become irritated if using the CHG soap.  Contact lenses, hearing aids and dentures may not be worn into surgery.  Do not bring valuables to the hospital. [facility] is not responsible for any missing/lost belongings or valuables.   Use CHG Soap as directed on instruction sheet.  Notify your doctor if there is any change in your medical condition (cold, fever, infection).  Wear comfortable clothing (specific to your surgery type) to the hospital.  Plan for stool softeners for home use; pain medications have a tendency to cause constipation. You can  also help prevent constipation by eating foods high in fiber such as fruits and vegetables and drinking plenty of fluids as your diet allows.  After surgery, you can help prevent lung complications by doing breathing exercises.  Take deep breaths and cough every 1-2 hours. Your doctor may order a device called an Incentive Spirometer to help you take deep breaths.  If you are being admitted to the hospital overnight, leave your  suitcase in the car. After surgery it may be brought to your room.  If you are being discharged the day of surgery, you will not be allowed to drive home. You will need a responsible adult (18 years or older) to drive you home and stay with you that night.   If you are taking public transportation, you will need to have a responsible adult (18 years or older) with you. Please confirm with your physician that it is acceptable to use public transportation.   Please call the [place] Dept. at [phone] if you have any questions about these instructions.  Surgery Visitation Policy:  Patients undergoing a surgery or procedure may have one family member or support person with them as long as that person is not COVID-19 positive or experiencing its symptoms.  That person may remain in the waiting area during the procedure.  Inpatient Visitation:    Visiting hours are 7 a.m. to 8 p.m. Inpatients will be allowed two visitors daily. The visitors may change each day during the patient's stay. No visitors under the age of 59. Any visitor under the age of 23 must be accompanied by an adult. The visitor must pass COVID-19 screenings, use hand sanitizer when entering and exiting the patient's room and wear a mask at all times, including in the patient's room. Patients must also wear a mask when staff or their visitor are in the room. Masking is required regardless of vaccination status.  Total Hip/Knee Replacement Preoperative Educational Video  To better prepare for surgery, please view our videos that explain the physical activity and discharge planning required to have the best surgical recovery at [facility].  [email or web address]      Questions? Call [phone] or email [email or web address]

## 2020-08-30 LAB — URINE CULTURE
Culture: NO GROWTH
Special Requests: NORMAL

## 2020-09-02 ENCOUNTER — Encounter: Payer: Self-pay | Admitting: Orthopedic Surgery

## 2020-09-02 NOTE — Progress Notes (Signed)
Perioperative Services  Pre-Admission/Anesthesia Testing Clinical Review  Date: 09/04/20  Patient Demographics:  Name: Gail Burns DOB:   1945-05-01 MRN:   [id number]  Planned Surgical Procedure(s):    Case: [id number] Date/Time: 09/10/20 0700   Procedure: COMPUTER ASSISTED TOTAL KNEE ARTHROPLASTY (Right Knee)   Anesthesia type: Choice   Pre-op diagnosis: PRIMARY OSTEOARTHRITIS OF RIGHT KNEE.   Location: [facility] OR ROOM 01 / [place] ORS FOR ANESTHESIA GROUP   Surgeons: Dereck Leep, MD    NOTE: Available PAT nursing documentation and vital signs have been reviewed. Clinical nursing staff has updated patient's PMH/PSHx, current medication list, and drug allergies/intolerances to ensure comprehensive history available to assist in medical decision making as it pertains to the aforementioned surgical procedure and anticipated anesthetic course.   Clinical Discussion:  Gail Burns is a 75 y.o. female who is submitted for pre-surgical anesthesia review and clearance prior to her undergoing the above procedure. Patient is a Former Smoker (20 pack years; quit 02/1990). Pertinent PMH includes: CAD, aortoiliac atherosclerosis, HTN, HLD, GERD (uses CACO3 tablets), OA, anxiety, depression.   Extensive review of available clinical information performed. [place] PMH and PSH updated with any diagnoses/procedures that  may have been inadvertently omitted during her intake with the pre-admission testing department's nursing staff.  Patient is followed by cardiology Nehemiah Massed, MD). She was last seen in the cardiology clinic on 07/09/2020; notes reviewed.  At the time of her clinic visit, patient reported to be doing well overall from a cardiovascular perspective.  She denied any angina, PND, orthopnea, palpitations, peripheral edema, vertiginous symptoms, or presyncope/syncope.  Patient reported some mild shortness of breath associated with activity that has been persistent for approximately 5 years;  symptom and unchanged from previous.  Patient with a history of cardiovascular diagnoses.   Patient underwent PCTA of an 80% coronary artery stenosis (unknown artery) in 2006. Procedure was performed in [place], [place].  Records not available for review regarding this procedure.  Information obtained from cardiology notes.   Stress echocardiogram performed in 08/2014 revealed normal right ventricular systolic function, mild valvular regurgitation and no valvular stenosis.  LVEF >55%.   Myocardial perfusion imaging study performed on 06/07/2020 revealed normal left ventricular systolic function and no evidence of stress-induced myocardial ischemia or arrhythmia.  LVEF was >55% (see full interpretation of cardiovascular testing below).  Blood pressure fairly well controlled at 136/70; takes no medications.  Patient on GDMT for her HLD diagnosis; takes rosuvastatin, fenofibrate, and ezetimibe. Functional capacity, as defined by DASI, is documented as being >/= 4 METS.  No changes were made to patient's medication regimen.  Patient to follow-up with outpatient cardiology in 6 months or sooner if needed.  Patient is scheduled for an elective total knee arthroplasty on 09/10/2020 with Dr. Skip Estimable, MD.  Given patient's past medical history significant for cardiovascular diagnoses, presurgical cardiac clearance was sought by the PAT team. Per cardiology, "this patient is optimized for surgery and may proceed with the planned procedural course with a LOW risk stratification". This patient is on daily antiplatelet therapy. She has been instructed on recommendations for holding her daily low-dose ASA for 7 days prior to her procedure with plans to restart as soon as postoperative bleeding risk felt to be minimized by her attending surgeon. The patient has been instructed that her last dose of her anticoagulant will be on 09/02/2020.  Patient denies previous perioperative complications with anesthesia in the  past. However, she did make mention of the fact that her  twin sister has (+) PMH of delayed emergence.  In review of the available records, it is noted that patient underwent a MAC anesthetic course at [facility] (ASA II) in 04/2018 without documented complications.   Vitals with BMI 08/29/2020 06/24/2017 06/10/2017  Height _0  _1  -  Weight 248 lbs 253 lbs 3 oz -  BMI 17.40 81.44 -  Systolic 818 563 149  Diastolic 70 68 73  Pulse 65 82 -    Providers/Specialists:   NOTE: Primary physician provider listed below. Patient may have been seen by APP or partner within same practice.   PROVIDER ROLE / SPECIALTY LAST OV  Hooten, Laurice Record, MD  Orthopedics (Surgeon)  05/15/2020  Wardell Honour, MD  Primary Care Provider  08/27/2020  Serafina Royals, MD  Cardiology  07/09/2020   Allergies:  Augmentin [amoxicillin-pot clavulanate]  Current Home Medications:   No current facility-administered medications for this encounter.   Marland Kitchen aspirin 81 MG tablet  . calcium carbonate (OS-CAL) 600 MG TABS  . escitalopram (LEXAPRO) 20 MG tablet  . ezetimibe (ZETIA) 10 MG tablet  . fenofibrate (TRICOR) 145 MG tablet  . Misc Natural Products (LUTEIN VISION BLEND PO)  . Multiple Vitamin (MULTIVITAMIN) tablet  . omeprazole (PRILOSEC) 20 MG capsule  . OVER THE COUNTER MEDICATION  . rosuvastatin (CRESTOR) 40 MG tablet  . acetaminophen (TYLENOL) 500 MG tablet  . ibuprofen (ADVIL) 200 MG tablet   History:   Past Medical History:  Diagnosis Date  . Anxiety   . Aorto-iliac atherosclerosis ([place])   . Basal cell carcinoma    face, chest, legs  . Benign neoplasm of colon   . Coronary atherosclerosis of native coronary artery   . Degeneration of lumbar or lumbosacral intervertebral disc   . Depression   . Diarrhea   . Diverticulosis of colon (without mention of hemorrhage)   . Family history of adverse reaction to anesthesia    twin sister has hard time waking up from anesthesia  . GERD  (gastroesophageal reflux disease)   . H/O acute pancreatitis   . Hyperlipidemia   . Hypertension   . Impaired glucose tolerance test   . Internal hemorrhoids without mention of complication   . Macular degeneration   . Obesity, unspecified   . Osteoarthritis    right knee  . Other dyspnea and respiratory abnormality   . Other malaise and fatigue   . Pancreatitis due to biliary obstruction 08/05/2012   two week admission in [place].  Step down unit.  . Seborrheic dermatitis, unspecified   . Tobacco use disorder   . Unspecified disorder of kidney and ureter    Past Surgical History:  Procedure Laterality Date  . Admission  08/05/2012   Acute pancreatitis. [place]. Two week admission; step down unit.  Marland Kitchen CATARACT EXTRACTION W/ INTRAOCULAR LENS  IMPLANT, BILATERAL Bilateral 2019  . CHOLECYSTECTOMY  2014  . colonoscopy with polypectomy    . COLONOSCOPY WITH PROPOFOL N/A 06/10/2017   Procedure: COLONOSCOPY WITH PROPOFOL;  Surgeon: Manya Silvas, MD;  Location: [facility] END[facility]COPY;  Service: Endoscopy;  Laterality: N/A;  . CORONARY ANGIOPLASTY  2006   80% blockage; PTCA of unknown artery; Location: [place], [place].   Marland Kitchen dilatation and curettage  x 2  . DILATION AND CURETTAGE OF UTERUS    . ESOPHAGOGASTRODUODENOSCOPY (EGD) WITH PROPOFOL N/A 06/10/2017   Procedure: ESOPHAGOGASTRODUODENOSCOPY (EGD) WITH PROPOFOL;  Surgeon: Manya Silvas, MD;  Location: [facility] ENDOSCOPY;  Service: Endoscopy;  Laterality: N/A;  .  eye lid surgery Bilateral 2012  . EYE SURGERY     per patient's health survey - LASER  . KNEE SURGERY Right 2010   arthroscopic  . Laser therapy and injections for macular degeneration    . TUBAL LIGATION  1982   Family History  Problem Relation Age of Onset  . Cancer Mother   . Heart disease Mother          CAD     has pacemaker  . Dementia Mother   . Diabetes Mother   . Macular degeneration Mother   . Heart disease Father   . Rheumatic fever Father   . Heart disease Sister 66        CABG  . Hyperlipidemia Sister   . Stroke Sister 63       TIA's  . Heart disease Brother        CABG age 35  . COPD Brother   . Heart disease Maternal Grandfather   . Hyperlipidemia Sister   . Hypertension Sister   . Arthritis Sister        DDD lumbar s/p surgery  . Parkinson's disease Maternal Grandmother   . Heart disease Paternal Grandmother   . Breast cancer Daughter 54   Social History   Tobacco Use  . Smoking status: Former Smoker    Packs/day: 0.50    Years: 40.00    Pack years: 20.00    Types: Cigarettes    Quit date: 02/18/1990    Years since quitting: 30.5  . Smokeless tobacco: Never Used  . Tobacco comment: quit 1991  Vaping Use  . Vaping Use: Never used  Substance Use Topics  . Alcohol use: Yes    Comment: occassional  . Drug use: No    Pertinent Clinical Results:  LABS: Labs reviewed: Acceptable for surgery.  No visits with results within 3 Day(s) from this visit.  Latest known visit with results is:  Hospital Outpatient Visit on 08/29/2020  Component Date Value Ref Range Status  . Prothrombin Time 08/29/2020 12.9  11.4 - 15.2 seconds Final  . INR 08/29/2020 1.0  0.8 - 1.2 Final   Comment: (NOTE) INR goal varies based on device and disease states. Performed at [facility], [address], [place], [place] [address]   . aPTT 08/29/2020 35  24 - 36 seconds Final   Performed at [facility], [address] [place], [place] [address]  . Color, Urine 08/29/2020 YELLOW* YELLOW Final  . APPearance 08/29/2020 CLEAR* CLEAR Final  . Specific Gravity, Urine 08/29/2020 1.012  1.005 - 1.030 Final  . pH 08/29/2020 7.0  5.0 - 8.0 Final  . Glucose, UA 08/29/2020 NEGATIVE  NEGATIVE mg/dL Final  . Hgb urine dipstick 08/29/2020 NEGATIVE  NEGATIVE Final  . Bilirubin Urine 08/29/2020 NEGATIVE  NEGATIVE Final  . Ketones, ur 08/29/2020 NEGATIVE  NEGATIVE mg/dL Final  . Protein, ur 08/29/2020 NEGATIVE  NEGATIVE mg/dL Final  . Nitrite  08/29/2020 NEGATIVE  NEGATIVE Final  . Chalmers Guest 08/29/2020 NEGATIVE  NEGATIVE Final   Performed at [facility], [address], [place], [place] [address]  . Specimen Description 08/29/2020    Final                   Value:URINE, RANDOM Performed at [facility], [address] [place], [place] [address]   . Special Requests 08/29/2020    Final  Value:Normal Performed at [facility], [address], [place], [place] [address]   . Culture 08/29/2020    Final                   Value:NO GROWTH Performed at [facility], [place] [address], [place], [place] [address]   . Report Status 08/29/2020 08/30/2020 FINAL   Final  . ABO/RH(D) 08/29/2020 O POS   Final  . Antibody Screen 08/29/2020 NEG   Final  . Sample Expiration 08/29/2020 09/12/2020,2359   Final  . Extend sample reason 08/29/2020    Final                   Value:NO TRANSFUSIONS OR PREGNANCY IN THE PAST 3 MONTHS Performed at [facility], [address] [place], [place] [address]   . CRP 08/29/2020 0.6  <1.0 mg/dL Final   Performed at [facility] [address], [place], [place] [address]  . IgE (Immunoglobulin E), Serum 08/29/2020 4* 6 - 495 IU/mL Final   Comment: (NOTE) Performed At: [facility] [place], [place] [id number] Rush Farmer MD OF:[id number]   . Sed Rate 08/29/2020 28  0 - 30 mm/hr Final   Performed at [facility], [address] [place], [place] [address]  . MRSA, PCR 08/29/2020 NEGATIVE  NEGATIVE Final  . Staphylococcus aureus 08/29/2020 NEGATIVE  NEGATIVE Final   Comment: (NOTE) The Xpert SA Assay (FDA approved for NASAL specimens in patients 68 years of age and older), is one component of a comprehensive surveillance program. It is not intended to diagnose infection nor to guide or monitor treatment. Performed at [facility], [address] [place], [place] [address]      ECG: Date: 08/29/2020 Time ECG obtained: 1028 AM Rate: 65 bpm Rhythm: normal sinus Axis (leads I and aVF): Normal Intervals: PR 156 ms. QRS 66 ms. QTc 428 ms. ST segment and T wave changes: Evidence of age undetermined anterior infarct; TWI in lead III  Comparison: Similar to previous tracing obtained on 03/09/2020   IMAGING / PROCEDURES: LEXISCAN performed on 06/07/2020 1. LVEF >55% 2. Overall, left ventricular systolic function was normal without regional wall motion abnormalities. 3. Myocardial perfusion imaging is Normal.  4. No evidence of ischemia or infarction.  5. Stress ECG is Normal.  6. Summed severity score is 7.  7. Artifacts noted: breast attenuation.   DIAGNOSTIC RADIOGRAPHS RIGHT KNEE 3 VIEWS performed on 05/15/2020 1. Narrowing of the medial cartilage space with near bone-on-bone articulation and associated varus alignment.  2. Osteophyte formation is noted.  3. Subchondral sclerosis is noted.  4. No evidence of fracture or dislocation.  STRESS ECHOCARDIOGRAM performed on 08/07/2014 1. Normal right ventricular systolic function 2. Mild MR 3. Trivial TR and PR 4. No AR 5. No evidence of valvular stenosis 6. No observed pericardial effusion 7. Normal stress echocardiogram  Impression and Plan:  Gail Burns has been referred for pre-anesthesia review and clearance prior to her undergoing the planned anesthetic and procedural courses. Available labs, pertinent testing, and imaging results were personally reviewed by me. This patient has been appropriately cleared by cardiology with an overall LOW risk of significant perioperative cardiovascular complications.  Based on clinical review performed today (09/04/20), barring any significant acute changes in the patient's overall condition, it is anticipated that she will be able to proceed with the planned surgical intervention. Any acute changes in clinical condition may necessitate her procedure being  postponed and/or cancelled. Patient will meet with anesthesia team (MD and/or CRNA) on the day of her procedure for preoperative evaluation/assessment. Questions regarding anesthetic course will be fielded at that time.   Pre-surgical instructions were reviewed with the patient during her PAT appointment and questions were fielded by PAT clinical staff. Patient was advised that if any questions or concerns arise prior to her procedure then she should return a call to PAT and/or her surgeon's office to discuss.  Honor Loh, MSN, APRN, FNP-C, CEN [facility]  Peri-operative Services Nurse Practitioner Phone: [phone] 09/04/20 12:24 PM  NOTE: This note has been prepared using Dragon dictation software. Despite my best ability to proofread, there is always the potential that unintentional transcriptional errors may still occur from this process.

## 2020-09-04 ENCOUNTER — Other Ambulatory Visit: Payer: Self-pay | Admitting: Family Medicine

## 2020-09-04 DIAGNOSIS — Z1231 Encounter for screening mammogram for malignant neoplasm of breast: Secondary | ICD-10-CM

## 2020-09-04 LAB — IGE: IgE (Immunoglobulin E), Serum: 4 IU/mL — ABNORMAL LOW (ref 6–495)

## 2020-09-06 ENCOUNTER — Other Ambulatory Visit: Payer: Self-pay

## 2020-09-06 ENCOUNTER — Other Ambulatory Visit
Admission: RE | Admit: 2020-09-06 | Discharge: 2020-09-06 | Disposition: A | Discharge status: Home / Self Care | Payer: Medicare Other | Source: Ambulatory Visit | Attending: Orthopedic Surgery | Admitting: Orthopedic Surgery

## 2020-09-06 DIAGNOSIS — Z20822 Contact with and (suspected) exposure to covid-19: Secondary | ICD-10-CM | POA: Insufficient documentation

## 2020-09-06 DIAGNOSIS — Z01812 Encounter for preprocedural laboratory examination: Secondary | ICD-10-CM | POA: Diagnosis present

## 2020-09-07 LAB — SARS CORONAVIRUS 2 (TAT 6-24 HRS): SARS Coronavirus 2: NEGATIVE

## 2020-09-10 ENCOUNTER — Observation Stay: Payer: Medicare Other

## 2020-09-10 ENCOUNTER — Encounter: Payer: Self-pay | Admitting: Orthopedic Surgery

## 2020-09-10 ENCOUNTER — Ambulatory Visit: Payer: Medicare Other | Admitting: Urgent Care

## 2020-09-10 ENCOUNTER — Observation Stay
Admission: RE | Admit: 2020-09-10 | Discharge: 2020-09-11 | Disposition: A | Discharge status: Home / Self Care | Payer: Medicare Other | Attending: Orthopedic Surgery | Admitting: Orthopedic Surgery

## 2020-09-10 ENCOUNTER — Encounter
Admission: RE | Disposition: A | Discharge status: Home / Self Care | Payer: Self-pay | Source: Home / Self Care | Attending: Orthopedic Surgery

## 2020-09-10 ENCOUNTER — Other Ambulatory Visit: Payer: Self-pay

## 2020-09-10 DIAGNOSIS — I2510 Atherosclerotic heart disease of native coronary artery without angina pectoris: Secondary | ICD-10-CM | POA: Diagnosis not present

## 2020-09-10 DIAGNOSIS — Z79899 Other long term (current) drug therapy: Secondary | ICD-10-CM | POA: Diagnosis not present

## 2020-09-10 DIAGNOSIS — M1711 Unilateral primary osteoarthritis, right knee: Principal | ICD-10-CM | POA: Insufficient documentation

## 2020-09-10 DIAGNOSIS — Z87891 Personal history of nicotine dependence: Secondary | ICD-10-CM | POA: Diagnosis not present

## 2020-09-10 DIAGNOSIS — Z7982 Long term (current) use of aspirin: Secondary | ICD-10-CM | POA: Diagnosis not present

## 2020-09-10 DIAGNOSIS — Z96659 Presence of unspecified artificial knee joint: Secondary | ICD-10-CM

## 2020-09-10 DIAGNOSIS — I10 Essential (primary) hypertension: Secondary | ICD-10-CM | POA: Insufficient documentation

## 2020-09-10 HISTORY — PX: KNEE ARTHROPLASTY: SHX992

## 2020-09-10 HISTORY — DX: Atherosclerosis of aorta: I70.0

## 2020-09-10 HISTORY — DX: Gastro-esophageal reflux disease without esophagitis: K21.9

## 2020-09-10 HISTORY — DX: Atherosclerosis of other arteries: I70.8

## 2020-09-10 LAB — ABO/RH: ABO/RH(D): O POS

## 2020-09-10 SURGERY — ARTHROPLASTY, KNEE, TOTAL, USING IMAGELESS COMPUTER-ASSISTED NAVIGATION
Anesthesia: Spinal | Site: Knee | Laterality: Right

## 2020-09-10 MED ORDER — CELECOXIB 200 MG PO CAPS: 400.0000 mg | ORAL_CAPSULE | Freq: Once | ORAL | Status: AC

## 2020-09-10 MED ORDER — DEXAMETHASONE SODIUM PHOSPHATE 10 MG/ML IJ SOLN
INTRAMUSCULAR | Status: AC
  Administered 2020-09-10: 8 mg via INTRAVENOUS
  Filled 2020-09-10: qty 1

## 2020-09-10 MED ORDER — DEXAMETHASONE SODIUM PHOSPHATE 10 MG/ML IJ SOLN: 8.0000 mg | Freq: Once | INTRAMUSCULAR | Status: AC

## 2020-09-10 MED ORDER — LACTATED RINGERS IV SOLN: INTRAVENOUS | Status: DC

## 2020-09-10 MED ORDER — GABAPENTIN 300 MG PO CAPS
ORAL_CAPSULE | ORAL | Status: AC
  Administered 2020-09-10: 300 mg via ORAL
  Filled 2020-09-10: qty 1

## 2020-09-10 MED ORDER — CELECOXIB 200 MG PO CAPS
200.0000 mg | ORAL_CAPSULE | Freq: Two times a day (BID) | ORAL | Status: DC
  Administered 2020-09-10 – 2020-09-11 (×3): 200 mg via ORAL
  Filled 2020-09-10 (×3): qty 1

## 2020-09-10 MED ORDER — ACETAMINOPHEN 10 MG/ML IV SOLN
INTRAVENOUS | Status: DC | PRN
  Administered 2020-09-10: 1000 mg via INTRAVENOUS

## 2020-09-10 MED ORDER — ENOXAPARIN SODIUM 30 MG/0.3ML IJ SOSY
30.0000 mg | PREFILLED_SYRINGE | Freq: Two times a day (BID) | INTRAMUSCULAR | Status: DC
  Filled 2020-09-10: qty 0.3

## 2020-09-10 MED ORDER — TRANEXAMIC ACID-NACL 1000-0.7 MG/100ML-% IV SOLN
INTRAVENOUS | Status: AC
  Administered 2020-09-10: 1000 mg via INTRAVENOUS
  Filled 2020-09-10: qty 100

## 2020-09-10 MED ORDER — ENSURE PRE-SURGERY PO LIQD
296.0000 mL | Freq: Once | ORAL | Status: DC
  Filled 2020-09-10: qty 296

## 2020-09-10 MED ORDER — ONDANSETRON HCL 4 MG/2ML IJ SOLN: 4.0000 mg | Freq: Four times a day (QID) | INTRAMUSCULAR | Status: DC | PRN

## 2020-09-10 MED ORDER — ONDANSETRON HCL 4 MG/2ML IJ SOLN: 4.0000 mg | Freq: Once | INTRAMUSCULAR | Status: DC | PRN

## 2020-09-10 MED ORDER — TRANEXAMIC ACID-NACL 1000-0.7 MG/100ML-% IV SOLN
1000.0000 mg | INTRAVENOUS | Status: AC
  Administered 2020-09-10: 1000 mg via INTRAVENOUS

## 2020-09-10 MED ORDER — CEFAZOLIN SODIUM-DEXTROSE 2-4 GM/100ML-% IV SOLN
2.0000 g | Freq: Four times a day (QID) | INTRAVENOUS | Status: AC
  Administered 2020-09-10: 2 g via INTRAVENOUS
  Filled 2020-09-10 (×2): qty 100

## 2020-09-10 MED ORDER — ESCITALOPRAM OXALATE 20 MG PO TABS
20.0000 mg | ORAL_TABLET | Freq: Every day | ORAL | Status: DC
  Administered 2020-09-11: 20 mg via ORAL
  Filled 2020-09-10: qty 1

## 2020-09-10 MED ORDER — SURGIPHOR WOUND IRRIGATION SYSTEM - OPTIME
TOPICAL | Status: DC | PRN
  Administered 2020-09-10: 1 via TOPICAL

## 2020-09-10 MED ORDER — ORAL CARE MOUTH RINSE: 15.0000 mL | Freq: Once | OROMUCOSAL | Status: AC

## 2020-09-10 MED ORDER — BUPIVACAINE HCL (PF) 0.5 % IJ SOLN
INTRAMUSCULAR | Status: DC | PRN
  Administered 2020-09-10: 3 mL

## 2020-09-10 MED ORDER — ADULT MULTIVITAMIN W/MINERALS CH
1.0000 | ORAL_TABLET | Freq: Every day | ORAL | Status: DC
  Administered 2020-09-10 – 2020-09-11 (×2): 1 via ORAL
  Filled 2020-09-10 (×2): qty 1

## 2020-09-10 MED ORDER — MIDAZOLAM HCL 5 MG/5ML IJ SOLN
INTRAMUSCULAR | Status: DC | PRN
  Administered 2020-09-10: 2 mg via INTRAVENOUS

## 2020-09-10 MED ORDER — TRAMADOL HCL 50 MG PO TABS: 50.0000 mg | ORAL_TABLET | ORAL | Status: DC | PRN

## 2020-09-10 MED ORDER — GABAPENTIN 300 MG PO CAPS: 300.0000 mg | ORAL_CAPSULE | Freq: Once | ORAL | Status: AC

## 2020-09-10 MED ORDER — CHLORHEXIDINE GLUCONATE 0.12 % MT SOLN
OROMUCOSAL | Status: AC
  Administered 2020-09-10: 15 mL via OROMUCOSAL
  Filled 2020-09-10: qty 15

## 2020-09-10 MED ORDER — CEFAZOLIN SODIUM-DEXTROSE 2-4 GM/100ML-% IV SOLN
2.0000 g | INTRAVENOUS | Status: AC
  Administered 2020-09-10: 2 g via INTRAVENOUS

## 2020-09-10 MED ORDER — SENNOSIDES-DOCUSATE SODIUM 8.6-50 MG PO TABS
1.0000 | ORAL_TABLET | Freq: Two times a day (BID) | ORAL | Status: DC
  Administered 2020-09-10 – 2020-09-11 (×3): 1 via ORAL
  Filled 2020-09-10 (×3): qty 1

## 2020-09-10 MED ORDER — FENTANYL CITRATE (PF) 100 MCG/2ML IJ SOLN: 25.0000 ug | INTRAMUSCULAR | Status: DC | PRN

## 2020-09-10 MED ORDER — FERROUS SULFATE 325 (65 FE) MG PO TABS
325.0000 mg | ORAL_TABLET | Freq: Two times a day (BID) | ORAL | Status: DC
  Administered 2020-09-10 – 2020-09-11 (×2): 325 mg via ORAL
  Filled 2020-09-10 (×2): qty 1

## 2020-09-10 MED ORDER — CALCIUM CARBONATE 1250 (500 CA) MG PO TABS
1250.0000 mg | ORAL_TABLET | Freq: Every day | ORAL | Status: DC
  Administered 2020-09-10: 1250 mg via ORAL
  Filled 2020-09-10 (×2): qty 1

## 2020-09-10 MED ORDER — PHENOL 1.4 % MT LIQD
1.0000 | OROMUCOSAL | Status: DC | PRN
  Filled 2020-09-10: qty 177

## 2020-09-10 MED ORDER — CHLORHEXIDINE GLUCONATE 4 % EX LIQD: 60.0000 mL | Freq: Once | CUTANEOUS | Status: DC

## 2020-09-10 MED ORDER — CELECOXIB 200 MG PO CAPS
ORAL_CAPSULE | ORAL | Status: AC
  Administered 2020-09-10: 400 mg via ORAL
  Filled 2020-09-10: qty 2

## 2020-09-10 MED ORDER — PROPOFOL 10 MG/ML IV BOLUS
INTRAVENOUS | Status: DC | PRN
  Administered 2020-09-10: 20 mg via INTRAVENOUS

## 2020-09-10 MED ORDER — SODIUM CHLORIDE 0.9 % IV SOLN
INTRAVENOUS | Status: DC | PRN
  Administered 2020-09-10: 40 ug/min via INTRAVENOUS

## 2020-09-10 MED ORDER — ROSUVASTATIN CALCIUM 10 MG PO TABS
40.0000 mg | ORAL_TABLET | Freq: Every day | ORAL | Status: DC
  Administered 2020-09-10: 40 mg via ORAL
  Filled 2020-09-10: qty 4

## 2020-09-10 MED ORDER — MENTHOL 3 MG MT LOZG
1.0000 | LOZENGE | OROMUCOSAL | Status: DC | PRN
  Filled 2020-09-10: qty 9

## 2020-09-10 MED ORDER — SODIUM CHLORIDE 0.9 % IV SOLN: INTRAVENOUS | Status: DC

## 2020-09-10 MED ORDER — FENOFIBRATE 160 MG PO TABS
160.0000 mg | ORAL_TABLET | Freq: Every day | ORAL | Status: DC
  Administered 2020-09-10 – 2020-09-11 (×2): 160 mg via ORAL
  Filled 2020-09-10 (×2): qty 1

## 2020-09-10 MED ORDER — EZETIMIBE 10 MG PO TABS
10.0000 mg | ORAL_TABLET | Freq: Every day | ORAL | Status: DC
  Administered 2020-09-11: 10 mg via ORAL
  Filled 2020-09-10: qty 1

## 2020-09-10 MED ORDER — PANTOPRAZOLE SODIUM 40 MG PO TBEC
40.0000 mg | DELAYED_RELEASE_TABLET | Freq: Two times a day (BID) | ORAL | Status: DC
  Administered 2020-09-10 – 2020-09-11 (×3): 40 mg via ORAL
  Filled 2020-09-10 (×3): qty 1

## 2020-09-10 MED ORDER — CEFAZOLIN SODIUM-DEXTROSE 2-4 GM/100ML-% IV SOLN
INTRAVENOUS | Status: AC
  Administered 2020-09-10: 2 g via INTRAVENOUS
  Filled 2020-09-10: qty 100

## 2020-09-10 MED ORDER — SODIUM CHLORIDE 0.9 % IV SOLN
INTRAVENOUS | Status: DC | PRN
  Administered 2020-09-10: 60 mL

## 2020-09-10 MED ORDER — DIPHENHYDRAMINE HCL 12.5 MG/5ML PO ELIX: 12.5000 mg | ORAL_SOLUTION | ORAL | Status: DC | PRN

## 2020-09-10 MED ORDER — TRANEXAMIC ACID-NACL 1000-0.7 MG/100ML-% IV SOLN
1000.0000 mg | Freq: Once | INTRAVENOUS | Status: AC
Start: 2020-09-10 — End: 2020-09-10

## 2020-09-10 MED ORDER — OXYCODONE HCL 5 MG PO TABS
10.0000 mg | ORAL_TABLET | ORAL | Status: DC | PRN
  Administered 2020-09-10 – 2020-09-11 (×4): 10 mg via ORAL
  Filled 2020-09-10 (×4): qty 2

## 2020-09-10 MED ORDER — GLYCOPYRROLATE 0.2 MG/ML IJ SOLN
INTRAMUSCULAR | Status: DC | PRN
  Administered 2020-09-10: .2 mg via INTRAVENOUS

## 2020-09-10 MED ORDER — OXYCODONE HCL 5 MG PO TABS
5.0000 mg | ORAL_TABLET | ORAL | Status: DC | PRN
  Administered 2020-09-10: 5 mg via ORAL
  Filled 2020-09-10: qty 1

## 2020-09-10 MED ORDER — HYDROMORPHONE HCL 1 MG/ML IJ SOLN
0.5000 mg | INTRAMUSCULAR | Status: DC | PRN
  Administered 2020-09-10: 1 mg via INTRAVENOUS
  Filled 2020-09-10: qty 1

## 2020-09-10 MED ORDER — FLEET ENEMA 7-19 GM/118ML RE ENEM: 1.0000 | ENEMA | Freq: Once | RECTAL | Status: DC | PRN

## 2020-09-10 MED ORDER — PROPOFOL 1000 MG/100ML IV EMUL
INTRAVENOUS | Status: AC
  Filled 2020-09-10: qty 100

## 2020-09-10 MED ORDER — BUPIVACAINE HCL (PF) 0.25 % IJ SOLN
INTRAMUSCULAR | Status: DC | PRN
  Administered 2020-09-10: 60 mL

## 2020-09-10 MED ORDER — ALUM & MAG HYDROXIDE-SIMETH 200-200-20 MG/5ML PO SUSP: 30.0000 mL | ORAL | Status: DC | PRN

## 2020-09-10 MED ORDER — PROPOFOL 500 MG/50ML IV EMUL
INTRAVENOUS | Status: DC | PRN
  Administered 2020-09-10: 200 ug/kg/min via INTRAVENOUS

## 2020-09-10 MED ORDER — NEOMYCIN-POLYMYXIN B GU 40-200000 IR SOLN
Status: DC | PRN
  Administered 2020-09-10: 16 mL

## 2020-09-10 MED ORDER — ACETAMINOPHEN 325 MG PO TABS
325.0000 mg | ORAL_TABLET | Freq: Four times a day (QID) | ORAL | Status: DC | PRN
Start: 2020-09-11 — End: 2020-09-11

## 2020-09-10 MED ORDER — CHLORHEXIDINE GLUCONATE 0.12 % MT SOLN: 15.0000 mL | Freq: Once | OROMUCOSAL | Status: AC

## 2020-09-10 MED ORDER — ACETAMINOPHEN 10 MG/ML IV SOLN
1000.0000 mg | Freq: Four times a day (QID) | INTRAVENOUS | Status: AC
  Administered 2020-09-10 (×3): 1000 mg via INTRAVENOUS
  Filled 2020-09-10 (×4): qty 100

## 2020-09-10 MED ORDER — BISACODYL 10 MG RE SUPP: 10.0000 mg | Freq: Every day | RECTAL | Status: DC | PRN

## 2020-09-10 MED ORDER — MAGNESIUM HYDROXIDE 400 MG/5ML PO SUSP
30.0000 mL | Freq: Every day | ORAL | Status: DC
  Administered 2020-09-10 – 2020-09-11 (×2): 30 mL via ORAL
  Filled 2020-09-10 (×2): qty 30

## 2020-09-10 MED ORDER — ONDANSETRON HCL 4 MG PO TABS
4.0000 mg | ORAL_TABLET | Freq: Four times a day (QID) | ORAL | Status: DC | PRN
  Administered 2020-09-11: 4 mg via ORAL
  Filled 2020-09-10: qty 1

## 2020-09-10 MED ORDER — MIDAZOLAM HCL 2 MG/2ML IJ SOLN
INTRAMUSCULAR | Status: AC
  Filled 2020-09-10: qty 2

## 2020-09-10 MED ORDER — METOCLOPRAMIDE HCL 10 MG PO TABS
10.0000 mg | ORAL_TABLET | Freq: Three times a day (TID) | ORAL | Status: DC
  Administered 2020-09-10 – 2020-09-11 (×2): 10 mg via ORAL
  Filled 2020-09-10 (×3): qty 1

## 2020-09-10 MED ORDER — TRANEXAMIC ACID-NACL 1000-0.7 MG/100ML-% IV SOLN
INTRAVENOUS | Status: AC
  Filled 2020-09-10: qty 100

## 2020-09-10 SURGICAL SUPPLY — 73 items
ATTUNE MED DOME PAT 38 KNEE (Knees) ×2 IMPLANT
ATTUNE PSFEM RTSZ6 NARCEM KNEE (Femur) ×2 IMPLANT
ATTUNE PSRP INSR SZ6 5 KNEE (Insert) ×2 IMPLANT
BASE TIBIA ATTUNE KNEE SYS SZ6 (Knees) ×1 IMPLANT
BATTERY INSTRU NAVIGATION (MISCELLANEOUS) ×8 IMPLANT
BLADE SAW 70X12.5 (BLADE) ×2 IMPLANT
BLADE SAW 90X13X1.19 OSCILLAT (BLADE) ×2 IMPLANT
BLADE SAW 90X25X1.19 OSCILLAT (BLADE) ×2 IMPLANT
BONE CEMENT GENTAMICIN (Cement) ×4 IMPLANT
CEMENT BONE GENTAMICIN (Cement) IMPLANT
CEMENT BONE GENTAMICIN 40 (Cement) ×2 IMPLANT
CEMENT HV SMART SET (Cement) IMPLANT
COOLER POLAR GLACIER W/PUMP (MISCELLANEOUS) ×2 IMPLANT
COVER WAND RF STERILE (DRAPES) ×2 IMPLANT
CUFF TOURN SGL QUICK 34 (TOURNIQUET CUFF) ×1
CUFF TRNQT CYL 34X4.125X (TOURNIQUET CUFF) ×1 IMPLANT
DRAPE 3/4 80X56 (DRAPES) ×2 IMPLANT
DRSG DERMACEA 8X12 NADH (GAUZE/BANDAGES/DRESSINGS) ×2 IMPLANT
DRSG MEPILEX SACRM 8.7X9.8 (GAUZE/BANDAGES/DRESSINGS) ×2 IMPLANT
DRSG OPSITE POSTOP 4X12 (GAUZE/BANDAGES/DRESSINGS) ×2 IMPLANT
DRSG OPSITE POSTOP 4X14 (GAUZE/BANDAGES/DRESSINGS) ×2 IMPLANT
DRSG TEGADERM 4X4.75 (GAUZE/BANDAGES/DRESSINGS) ×2 IMPLANT
DURAPREP 26ML APPLICATOR (WOUND CARE) ×4 IMPLANT
ELECT CAUTERY BLADE 6.4 (BLADE) ×2 IMPLANT
ELECT REM PT RETURN 9FT ADLT (ELECTROSURGICAL) ×2
ELECTRODE REM PT RTRN 9FT ADLT (ELECTROSURGICAL) ×1 IMPLANT
EX-PIN ORTHOLOCK NAV 4X150 (PIN) ×4 IMPLANT
GLOVE SURG ENC MOIS LTX SZ7.5 (GLOVE) ×4 IMPLANT
GLOVE SURG ENC TEXT LTX SZ7.5 (GLOVE) ×4 IMPLANT
GLOVE SURG UNDER LTX SZ8 (GLOVE) ×2 IMPLANT
GLOVE SURG UNDER POLY LF SZ7.5 (GLOVE) ×2 IMPLANT
GOWN STRL REUS W/ TWL LRG LVL3 (GOWN DISPOSABLE) ×2 IMPLANT
GOWN STRL REUS W/ TWL XL LVL3 (GOWN DISPOSABLE) ×1 IMPLANT
GOWN STRL REUS W/TWL LRG LVL3 (GOWN DISPOSABLE) ×2
GOWN STRL REUS W/TWL XL LVL3 (GOWN DISPOSABLE) ×1
HEMOVAC 400CC 10FR (MISCELLANEOUS) ×2 IMPLANT
HOLDER FOLEY CATH W/STRAP (MISCELLANEOUS) ×2 IMPLANT
HOOD PEEL AWAY FLYTE STAYCOOL (MISCELLANEOUS) ×4 IMPLANT
IRRIGATION SURGIPHOR STRL (IV SOLUTION) ×2 IMPLANT
IV NS IRRIG 3000ML ARTHROMATIC (IV SOLUTION) ×2 IMPLANT
KIT TURNOVER KIT A (KITS) ×2 IMPLANT
KNIFE SCULPS 14X20 (INSTRUMENTS) ×2 IMPLANT
LABEL OR SOLS (LABEL) ×2 IMPLANT
MANIFOLD NEPTUNE II (INSTRUMENTS) ×4 IMPLANT
NDL SAFETY ECLIPSE 18X1.5 (NEEDLE) ×1 IMPLANT
NEEDLE HYPO 18GX1.5 SHARP (NEEDLE) ×1
NEEDLE SPNL 20GX3.5 QUINCKE YW (NEEDLE) ×4 IMPLANT
PACK TOTAL KNEE (MISCELLANEOUS) ×2 IMPLANT
PAD WRAPON POLOR MULTI XL (MISCELLANEOUS) ×1 IMPLANT
PENCIL SMOKE EVACUATOR COATED (MISCELLANEOUS) ×2 IMPLANT
PIN DRILL QUICK PACK ×2 IMPLANT
PIN FIXATION 1/8DIA X 3INL (PIN) ×6 IMPLANT
PULSAVAC PLUS IRRIG FAN TIP (DISPOSABLE) ×2
SOL PREP PVP 2OZ (MISCELLANEOUS) ×2
SOLUTION PREP PVP 2OZ (MISCELLANEOUS) ×1 IMPLANT
SPONGE DRAIN TRACH 4X4 STRL 2S (GAUZE/BANDAGES/DRESSINGS) ×2 IMPLANT
STAPLER SKIN PROX 35W (STAPLE) ×2 IMPLANT
STOCKINETTE IMPERV 14X48 (MISCELLANEOUS) IMPLANT
STRAP TIBIA SHORT (MISCELLANEOUS) ×2 IMPLANT
SUCTION FRAZIER HANDLE 10FR (MISCELLANEOUS) ×1
SUCTION TUBE FRAZIER 10FR DISP (MISCELLANEOUS) ×1 IMPLANT
SUT VIC AB 0 CT1 36 (SUTURE) ×4 IMPLANT
SUT VIC AB 1 CT1 36 (SUTURE) ×4 IMPLANT
SUT VIC AB 2-0 CT2 27 (SUTURE) ×2 IMPLANT
SYR 20ML LL LF (SYRINGE) ×2 IMPLANT
SYR 30ML LL (SYRINGE) ×4 IMPLANT
TIBIA ATTUNE KNEE SYS BASE SZ6 (Knees) ×2 IMPLANT
TIP FAN IRRIG PULSAVAC PLUS (DISPOSABLE) ×1 IMPLANT
TOWEL OR 17X26 4PK STRL BLUE (TOWEL DISPOSABLE) ×2 IMPLANT
TOWER CARTRIDGE SMART MIX (DISPOSABLE) ×2 IMPLANT
TRAY FOLEY MTR SLVR 16FR STAT (SET/KITS/TRAYS/PACK) ×2 IMPLANT
WRAP-ON POLOR PAD MULTI XL (MISCELLANEOUS) ×1
WRAPON POLOR PAD MULTI XL (MISCELLANEOUS) ×1

## 2020-09-10 NOTE — Anesthesia Preprocedure Evaluation (Signed)
Anesthesia Evaluation  Patient identified by MRN, date of birth, ID band Patient awake    Reviewed: Allergy & Precautions, NPO status , Patient's Chart, lab work & pertinent test results  History of Anesthesia Complications Negative for: history of anesthetic complications  Airway Mallampati: II       Dental   Pulmonary neg sleep apnea, neg COPD, Not current smoker, former smoker,           Cardiovascular (-) hypertension+ CAD (80% blockage - angioplasty done, no problems since)  (-) Past MI and (-) CHF (-) dysrhythmias (-) Valvular Problems/Murmurs     Neuro/Psych neg Seizures Anxiety Depression    GI/Hepatic Neg liver ROS, GERD  Medicated,  Endo/Other  neg diabetes  Renal/GU Renal InsufficiencyRenal disease     Musculoskeletal   Abdominal   Peds  Hematology   Anesthesia Other Findings   Reproductive/Obstetrics                             Anesthesia Physical Anesthesia Plan  ASA: III  Anesthesia Plan: Spinal   Post-op Pain Management:    Induction:   PONV Risk Score and Plan:   Airway Management Planned:   Additional Equipment:   Intra-op Plan:   Post-operative Plan:   Informed Consent: I have reviewed the patients History and Physical, chart, labs and discussed the procedure including the risks, benefits and alternatives for the proposed anesthesia with the patient or authorized representative who has indicated his/her understanding and acceptance.       Plan Discussed with:   Anesthesia Plan Comments:         Anesthesia Quick Evaluation

## 2020-09-10 NOTE — Anesthesia Procedure Notes (Signed)
Date/Time: 09/10/2020 7:57 AM Performed by: Nelda Marseille, CRNA Pre-anesthesia Checklist: Patient identified, Emergency Drugs available, Suction available, Patient being monitored and Timeout performed Oxygen Delivery Method: Simple face mask

## 2020-09-10 NOTE — Evaluation (Signed)
Physical Therapy Evaluation Patient Details Name: Gail Burns MRN: [id number] DOB: December 10, 1945 Today's Date: 09/10/2020   History of Present Illness  Pt is a 75 yo female s/p R TKA,WBAT. PMH of former smoker, CAD, anxiety, depression, renal disease, GERD, HTN.  Clinical Impression  Patient alert, reported 4/10 pain in RLE. At baseline reported that she is independent, lives with her husband and family/friends available to assist as needed at discharge.   The patient was able to perform several supine exercises with verbal/tactile cueing, SLR performed without lag. Supine to sit with CGA for RLE, and good sitting balance noted. Pt reported light headedness throughout sitting EOB, tech in room to assess vitals, WFLs (BP 131/62). Further mobility deferred due to continued symptoms, returned to supine. Pt exhibited excellent ROM 0-84degrees.  Overall the patient demonstrated deficits (see "PT Problem List") that impede the patient's functional abilities, safety, and mobility and would benefit from skilled PT intervention. Recommendation at this time is HHPT with supervision/assist 24/7 pending further pt progress.      Follow Up Recommendations Home health PT;Supervision/Assistance - 24 hour    Equipment Recommendations  3in1 (PT)    Recommendations for Other Services       Precautions / Restrictions Precautions Precautions: Knee;Fall Precaution Booklet Issued: Yes (comment) Restrictions Weight Bearing Restrictions: Yes RLE Weight Bearing: Weight bearing as tolerated      Mobility  Bed Mobility Overal bed mobility: Needs Assistance Bed Mobility: Supine to Sit;Sit to Supine     Supine to sit: Min guard;HOB elevated Sit to supine: Min guard   General bed mobility comments: CGA for RLE assist as needed, pt reported light headedness throughout    Transfers                 General transfer comment: deferred due to pt continued light headedness  Ambulation/Gait                 Stairs            Wheelchair Mobility    Modified Rankin (Stroke Patients Only)       Balance Overall balance assessment: Needs assistance Sitting-balance support: Feet supported Sitting balance-Leahy Scale: Good                                       Pertinent Vitals/Pain Pain Assessment: 0-10 Pain Score: 4  Pain Location: R knee Pain Descriptors / Indicators: Guarding;Grimacing;Sore Pain Intervention(s): Limited activity within patient's tolerance;Monitored during session;RN gave pain meds during session;Repositioned;Ice applied    Home Living Family/patient expects to be discharged to:: Private residence Living Arrangements: Spouse/significant other Available Help at Discharge: Family;Friend(s);Available 24 hours/day Type of Home: House Home Access: Level entry     Home Layout: Able to live on main level with bedroom/bathroom Home Equipment: Walker - 2 wheels (3 wheeled walker)      Prior Function Level of Independence: Independent               Hand Dominance        Extremity/Trunk Assessment   Upper Extremity Assessment Upper Extremity Assessment: Defer to OT evaluation;Overall [facility] for tasks assessed    Lower Extremity Assessment Lower Extremity Assessment: RLE deficits/detail;LLE deficits/detail RLE Deficits / Details: SLR without lag LLE Deficits / Details: WFLs       Communication   Communication: No difficulties  Cognition Arousal/Alertness: Awake/alert Behavior During Therapy: WFL for tasks assessed/performed  Overall Cognitive Status: Within Functional Limits for tasks assessed                                        General Comments      Exercises Total Joint Exercises Ankle Circles/Pumps: AROM;Both;15 reps Quad Sets: AROM;Right;10 reps;Strengthening Gluteal Sets: AROM;Strengthening;Both;10 reps Heel Slides: AROM;Strengthening;Right;10 reps Straight Leg Raises:  AROM;Strengthening;Right;5 reps Knee Flexion: AROM;Seated;Right;10 reps Goniometric ROM: 0 - 84 degrees   Assessment/Plan    PT Assessment Patient needs continued PT services  PT Problem List Decreased strength;Decreased range of motion;Decreased knowledge of use of DME;Decreased activity tolerance;Decreased balance;Decreased knowledge of precautions;Pain;Decreased mobility       PT Treatment Interventions DME instruction;Balance training;Gait training;Neuromuscular re-education;Stair training;Functional mobility training;Patient/family education;Therapeutic activities;Therapeutic exercise    PT Goals (Current goals can be found in the Care Plan section)  Acute Rehab PT Goals Patient Stated Goal: to go home PT Goal Formulation: With patient Time For Goal Achievement: 09/24/20 Potential to Achieve Goals: Good    Frequency BID   Barriers to discharge        Co-evaluation               AM-PAC PT "6 Clicks" Mobility  Outcome Measure Help needed turning from your back to your side while in a flat bed without using bedrails?: A Little Help needed moving from lying on your back to sitting on the side of a flat bed without using bedrails?: A Little Help needed moving to and from a bed to a chair (including a wheelchair)?: A Little Help needed standing up from a chair using your arms (e.g., wheelchair or bedside chair)?: A Little Help needed to walk in hospital room?: A Little Help needed climbing 3-5 steps with a railing? : A Little 6 Click Score: 18    End of Session   Activity Tolerance: Other (comment) (limited due to light headedness) Patient left: in bed;with call bell/phone within reach;with bed alarm set;with SCD's reapplied;Other (comment) (polar care in place) Nurse Communication: Mobility status PT Visit Diagnosis: Other abnormalities of gait and mobility (R26.89);Pain;Difficulty in walking, not elsewhere classified (R26.2);Muscle weakness (generalized)  (M62.81) Pain - Right/Left: Right Pain - part of body: Knee    Time: 1451-1530 PT Time Calculation (min) (ACUTE ONLY): 39 min   Charges:   PT Evaluation $PT Eval Low Complexity: 1 Low PT Treatments $Therapeutic Exercise: 23-37 mins        Lieutenant Diego PT, DPT 3:49 PM,09/10/20

## 2020-09-10 NOTE — H&P (Signed)
The patient has been re-examined, and the chart reviewed, and there have been no interval changes to the documented history and physical.    The risks, benefits, and alternatives have been discussed at length. The patient expressed understanding of the risks benefits and agreed with plans for surgical intervention.  Andie Mungin P. Emery Dupuy, Jr. M.D.    

## 2020-09-10 NOTE — Progress Notes (Addendum)
[name]  Code Status: FULL Gail Burns is a 75 y.o. female patient admitted from OR awake, alert - oriented X4 - no acute distress noted. VSS -  no c/o shortness of breath, no c/o chest pain. Fall assessment complete, with patient able to verbalize understanding of risk associated with falls, and verbalized understanding to call nursing before up out of bed. Call light within reach, patient able to voice, and demonstrate understanding. Skin, clean and dry. Bandage to right knee intact. No evidence of skin break down noted on exam.  ?  Will cont to eval and treat per MD orders.  Melonie Florida, RN  09/10/2020 1:45 PM

## 2020-09-10 NOTE — Plan of Care (Signed)
  Problem: Education: Goal: Knowledge of General Education information will improve Description: Including pain rating scale, medication(s)/side effects and non-pharmacologic comfort measures Outcome: Progressing   Problem: Health Behavior/Discharge Planning: Goal: Ability to manage health-related needs will improve Outcome: Progressing   Problem: Clinical Measurements: Goal: Ability to maintain clinical measurements within normal limits will improve Outcome: Progressing Goal: Will remain free from infection Outcome: Progressing Goal: Diagnostic test results will improve Outcome: Progressing Goal: Respiratory complications will improve Outcome: Progressing Goal: Cardiovascular complication will be avoided Outcome: Progressing   Problem: Activity: Goal: Risk for activity intolerance will decrease Outcome: Progressing   Problem: Nutrition: Goal: Adequate nutrition will be maintained Outcome: Progressing   Problem: Coping: Goal: Level of anxiety will decrease Outcome: Progressing   Problem: Elimination: Goal: Will not experience complications related to bowel motility Outcome: Progressing Goal: Will not experience complications related to urinary retention Outcome: Progressing   Problem: Skin Integrity: Goal: Risk for impaired skin integrity will decrease Outcome: Progressing   Problem: Education: Goal: Knowledge of the prescribed therapeutic regimen will improve Outcome: Progressing Goal: Individualized Educational Video(s) Outcome: Progressing

## 2020-09-10 NOTE — Transfer of Care (Signed)
Immediate Anesthesia Transfer of Care Note  Patient: Gail Burns  Procedure(s) Performed: COMPUTER ASSISTED TOTAL KNEE ARTHROPLASTY (Right Knee)  Patient Location: PACU  Anesthesia Type:Spinal  Level of Consciousness: awake and sedated  Airway & Oxygen Therapy: Patient Spontanous Breathing and Patient connected to face mask oxygen  Post-op Assessment: Report given to RN and Post -op Vital signs reviewed and stable  Post vital signs: Reviewed and stable  Last Vitals:  Vitals Value Taken Time  BP 97/48 09/10/20 1104  Temp    Pulse 55 09/10/20 1109  Resp 17 09/10/20 1109  SpO2 98 % 09/10/20 1109  Vitals shown include unvalidated device data.  Last Pain:  Vitals:   09/10/20 0626  TempSrc: Temporal  PainSc: 0-No pain      Patients Stated Pain Goal: 0 (24/46/95 0722)  Complications: No complications documented.

## 2020-09-10 NOTE — H&P (Signed)
ORTHOPAEDIC HISTORY & PHYSICAL Gwenlyn Fudge, [place] - 09/04/2020 1:45 PM EDT Formatting of this note is different from the original. [facility] Chief Complaint:   Chief Complaint  Patient presents with  . Knee Pain  H & P RIGHT TOTAL KNEE ARTHROPLASTY   History of Present Illness:   Gail Burns is a 75 y.o. female that presents to clinic today for her preoperative history and evaluation. Patient presents unaccompanied. The patient is scheduled to undergo a right total knee arthroplasty on 09/10/20 by Dr. Marry Guan. Her pain began several years ago and is worsened over the last 9 months. The pain is located primarily along the medial aspect of the knee. She describes her pain as worse with weightbearing. She reports associated swelling with some giving way of the knee. She denies associated numbness or tingling, denies locking.   The patient's symptoms have progressed to the point that they decrease her quality of life. The patient has previously undergone conservative treatment including NSAIDS and injections to the knee without adequate control of her symptoms.  Patient states she gets hives when she takes augmentin. Denies history of lumbar surgery, blood clots. She see Dr Nehemiah Massed for cardiology. She states she had a "balloonplasty" in 2006.   Past Medical, Surgical, Family, Social History, Allergies, Medications:   Past Medical History:  Past Medical History:  Diagnosis Date  . Abnormal CT of the abdomen 05/25/2017  . Anxiety  . Basal cell carcinoma  . CAD (coronary artery disease) 2006  from previous cardiac catheterization - angioplasty  . Depression  . Diarrhea 05/25/2017  . Diverticulosis  . Gastritis and duodenitis 08/24/2017  . GERD (gastroesophageal reflux disease)  . H/O acute pancreatitis 08/05/2012  with Pancreatic Infarction  . High cholesterol  . History of pancreatitis 05/25/2017  . Hypertension  . Macular degeneration   . Psoriasis  . Tobacco use disorder   Past Surgical History:  Past Surgical History:  Procedure Laterality Date  . ARTHROSCOPY KNEE Right 2010  . cardiac catheterization 2006  80% blockage  . CHOLECYSTECTOMY  . COLONOSCOPY 01/15/2009  PH Colon Polyps ([facility])  . COLONOSCOPY 12/22/2013  Adenomatous Polyp: CBF 12/2018  . COLONOSCOPY 06/10/2017  PH Adenomatous Polyps: [facility]BF 06/2022  . DILATION & CURRETTAGE  x 2  . EGD 06/10/2017  Gastritis, Duodenitis: No repeat per RTE  . EXTRACTION CATARACT EXTRACAPSULAR W/INSERTION INTRAOCULAR PROSTHESIS Right 02/22/2018  Procedure: LenSx/ LRI/ ORA/ DEXTENZA-----Cataract extraction with intraocular lens implant and possible limbal relaxing incisions; Surgeon: Arline Asp, MD; [facility]; Service: Ophthalmology; Laterality: Right;  . EXTRACTION CATARACT EXTRACAPSULAR W/INSERTION INTRAOCULAR PROSTHESIS Left 04/19/2018  Procedure: LRI/ ORA/ DEXTENZA------cataract extraction with intraocular lens implant; Surgeon: Arline Asp, MD; Location: [facility]; Service: Ophthalmology; Laterality: Left; LRI  . gallbladder sx 06/2012  . LAPAROSCOPIC TUBAL LIGATION 1982  . laser therapy  for Macular Degeneration   Current Medications:  Current Outpatient Medications  Medication Sig Dispense Refill  . acetaminophen (TYLENOL) 500 MG tablet Take 1,000 mg by mouth 2 (two) times daily  . age-related macular degeneration, AMD, monitoring (FORESEEHOME) Use as directed 1 each 0  . aspirin 81 MG EC tablet Take 81 mg by mouth nightly  . calcium carbonate 600 mg (1,500 mg) Tab tablet Take 600 mg by mouth once daily.  Marland Kitchen escitalopram oxalate (LEXAPRO) 20 MG tablet Take 1 tablet (20 mg total) by mouth once daily 90 tablet 4  . ezetimibe (ZETIA) 10 mg tablet TAKE 1  TABLET ONCE DAILY 90 tablet 3  . fenofibrate nanocrystallized (TRICOR) 145 MG tablet Take 1 tablet (145 mg total) by mouth once daily 90 tablet 4  . ibuprofen 200 mg Cap Take 3 tablets by  mouth 2 (two) times daily  . LUTEIN ORAL Take by mouth every evening  . multivitamin capsule Take 1 capsule by mouth once daily.  Marland Kitchen omeprazole (PRILOSEC) 20 MG DR capsule Take 1 capsule (20 mg total) by mouth once daily 90 capsule 3  . rosuvastatin (CRESTOR) 40 MG tablet Take 1 tablet (40 mg total) by mouth nightly 90 tablet 4   No current facility-administered medications for this visit.   Allergies:  Allergies  Allergen Reactions  . Amoxicillin-Pot Clavulanate Hives   Social History:  Social History   Socioeconomic History  . Marital status: Married  Spouse name: Kirstie Peri. Augustin Coupe  . Number of children: 2  . Years of education: 74  . Highest education level: High school graduate  Occupational History  . Occupation: Retired  Tobacco Use  . Smoking status: Former Smoker  Packs/day: 0.50  Years: 40.00  Pack years: 20.00  Quit date: 02/18/1990  Years since quitting: 30.5  . Smokeless tobacco: Never Used  Vaping Use  . Vaping Use: Never used  Substance and Sexual Activity  . Alcohol use: Yes  Alcohol/week: 0.0 standard drinks  Comment: Occasional  . Drug use: No  . Sexual activity: Not Currently  Partners: Male  Birth control/protection: Post-menopausal  Comment: Due to atrophic Vaginitis  Social History Narrative  Marital status: married x 56 years to [place]. From [place].  Children: 2 children; 4 grandchildren.  Lives; with husband, 1 dog (Emmie 4.5 lb)  Employment: homemaker  Exercise: two days per week; daughter works at gym at [employer].  ADLs: independent with ADLs; drives; no assisted devices.  Advanced Directives: YES; full code.   Family History:  Family History  Problem Relation Age of Onset  . Macular degeneration Mother  . Pacemaker Mother 58  at age 47  . Cancer Mother  . Diabetes Mother  . Dementia Mother  . Bone cancer Mother  . Heart failure Father  . Coronary Artery Disease (Blocked arteries around heart) Sister  early onset CAD and  myocardial infarction.  . Stroke Sister  . Heart failure Sister  . Cataracts Sister  . Osteoarthritis Sister  . Coronary Artery Disease (Blocked arteries around heart) Sister  early onset CAD and myocardial infarction  . Heart failure Sister  . Coronary Artery Disease (Blocked arteries around heart) Brother  bypass surgery at age 56  . Heart disease Brother  . Heart failure Brother  . No Known Problems Daughter  . No Known Problems Son  . Macular degeneration Maternal Grandmother  . Parkinsonism Maternal Grandmother  . Cataracts Maternal Grandmother  . Heart disease Maternal Grandfather  . Heart disease Paternal Grandmother  . Glaucoma Neg Hx  . Retinopathy of prematurity Neg Hx  . Retinal degeneration Neg Hx  . Blindness Neg Hx  . Anesthesia problems Neg Hx   Review of Systems:   A 10+ ROS was performed, reviewed, and the pertinent orthopaedic findings are documented in the HPI.   Physical Examination:   BP (!) 142/76 (BP Location: Left upper arm, Patient Position: Sitting, BP Cuff Size: Large Adult)  Ht 170.2 cm (5\' 7" )  Wt (!) 111.5 kg (245 lb 12.8 oz)  BMI 38.50 kg/m   Patient is a well-developed, well-nourished female in no acute distress. Patient has normal mood  and affect. Patient is alert and oriented to person, place, and time.   HEENT: Atraumatic, normocephalic. Pupils equal and reactive to light. Extraocular motion intact. Noninjected sclera.  Cardiovascular: Regular rate and rhythm, with no murmurs, rubs, or gallops. Distal pulses palpable. No bruits.   Respiratory: Lungs clear to auscultation bilaterally.   Right Knee: Soft tissue swelling: none Effusion: None Erythema: none Crepitance: mild Tenderness: medial Alignment: relative varus Mediolateral laxity: medial pseudolaxity Posterior sag: negative Patellar tracking: Good tracking without evidence of subluxation or tilt Atrophy: No significant atrophy.  Quadriceps tone was good. Range of motion:  0/0/110 degrees 121, lacks 6   Sensation intact over the saphenous, lateral sural cutaneous, superficial fibular, and deep fibular nerve distributions.  Tests Performed/Reviewed:  X-rays  X-ray knee right 3 views  Result Date: 09/04/2020 3 views of the right knee were obtained. Images reveal severe loss of medial compartment joint space with significant osteophyte formation. No fractures or dislocations. No osseous abnormality noted.   Impression:   ICD-10-CM  1. Primary osteoarthritis of right knee M17.11   Plan:   The patient has end-stage degenerative changes of the right knee. It was explained to the patient that the condition is progressive in nature. Having failed conservative treatment, the patient has elected to proceed with a total joint arthroplasty. The patient will undergo a total joint arthroplasty with Dr. Marry Guan. The risks of surgery, including blood clot and infection, were discussed with the patient. Measures to reduce these risks, including the use of anticoagulation, perioperative antibiotics, and early ambulation were discussed. The importance of postoperative physical therapy was discussed with the patient. The patient elects to proceed with surgery. The patient is instructed to stop all blood thinners prior to surgery. The patient is instructed to call the hospital the day before surgery to learn of the proper arrival time.   Contact our office with any questions or concerns. Follow up as indicated, or sooner should any new problems arise, if conditions worsen, or if they are otherwise concerned.   Gwenlyn Fudge, [facility] [place],  [id number] Phone: [phone]  This note was generated in part with voice recognition software and I apologize for any typographical errors that were not detected and corrected.  Electronically signed by Gwenlyn Fudge, PA at 09/04/2020 4:29 PM EDT

## 2020-09-10 NOTE — Op Note (Signed)
OPERATIVE NOTE  DATE OF SURGERY:  09/10/2020  PATIENT NAME:  Gail Burns   DOB: 1946-02-25  MRN: [id number]  PRE-OPERATIVE DIAGNOSIS: Degenerative arthrosis of the right knee, primary  POST-OPERATIVE DIAGNOSIS:  Same  PROCEDURE:  Right total knee arthroplasty using computer-assisted navigation  SURGEON:  Marciano Sequin. M.D.  ASSISTANT: Cassell Smiles, PA-C (present and scrubbed throughout the case, critical for assistance with exposure, retraction, instrumentation, and closure)  ANESTHESIA: spinal  ESTIMATED BLOOD LOSS: 50 mL  FLUIDS REPLACED: 1200 mL of crystalloid  TOURNIQUET TIME: 88 minutes  DRAINS: 2 medium Hemovac  SOFT TISSUE RELEASES: Anterior cruciate ligament, posterior cruciate ligament, deep medial collateral ligament, patellofemoral ligament  IMPLANTS UTILIZED: DePuy Attune size 6N posterior stabilized femoral component (cemented), size 6 rotating platform tibial component (cemented), 38 mm medialized dome patella (cemented), and a 5 mm stabilized rotating platform polyethylene insert.  INDICATIONS FOR SURGERY: Gail Burns is a 75 y.o. year old female with a long history of progressive knee pain. X-rays demonstrated severe degenerative changes in tricompartmental fashion. The patient had not seen any significant improvement despite conservative nonsurgical intervention. After discussion of the risks and benefits of surgical intervention, the patient expressed understanding of the risks benefits and agree with plans for total knee arthroplasty.   The risks, benefits, and alternatives were discussed at length including but not limited to the risks of infection, bleeding, nerve injury, stiffness, blood clots, the need for revision surgery, cardiopulmonary complications, among others, and they were willing to proceed.  PROCEDURE IN DETAIL: The patient was brought into the operating room and, after adequate spinal anesthesia was achieved, a tourniquet was placed on the  patient's upper thigh. The patient's knee and leg were cleaned and prepped with alcohol and DuraPrep and draped in the usual sterile fashion. A "timeout" was performed as per usual protocol. The lower extremity was exsanguinated using an Esmarch, and the tourniquet was inflated to 300 mmHg. An anterior longitudinal incision was made followed by a standard mid vastus approach. The deep fibers of the medial collateral ligament were elevated in a subperiosteal fashion off of the medial flare of the tibia so as to maintain a continuous soft tissue sleeve. The patella was subluxed laterally and the patellofemoral ligament was incised. Inspection of the knee demonstrated severe degenerative changes with full-thickness loss of articular cartilage. Osteophytes were debrided using a rongeur. Anterior and posterior cruciate ligaments were excised. Two 4.0 mm Schanz pins were inserted in the femur and into the tibia for attachment of the array of trackers used for computer-assisted navigation. Hip center was identified using a circumduction technique. Distal landmarks were mapped using the computer. The distal femur and proximal tibia were mapped using the computer. The distal femoral cutting guide was positioned using computer-assisted navigation so as to achieve a 5 distal valgus cut. The femur was sized and it was felt that a size 6N femoral component was appropriate. A size 6 femoral cutting guide was positioned and the anterior cut was performed and verified using the computer. This was followed by completion of the posterior and chamfer cuts. Femoral cutting guide for the central box was then positioned in the center box cut was performed.  Attention was then directed to the proximal tibia. Medial and lateral menisci were excised. The extramedullary tibial cutting guide was positioned using computer-assisted navigation so as to achieve a 0 varus-valgus alignment and 3 posterior slope. The cut was performed and  verified using the computer. The proximal tibia was sized  and it was felt that a size 6 tibial tray was appropriate. Tibial and femoral trials were inserted followed by insertion of a 5 mm polyethylene insert. This allowed for excellent mediolateral soft tissue balancing both in flexion and in full extension. Finally, the patella was cut and prepared so as to accommodate a 38 mm medialized dome patella. A patella trial was placed and the knee was placed through a range of motion with excellent patellar tracking appreciated. The femoral trial was removed after debridement of posterior osteophytes. The central post-hole for the tibial component was reamed followed by insertion of a keel punch. Tibial trials were then removed. Cut surfaces of bone were irrigated with copious amounts of normal saline using pulsatile lavage and then suctioned dry. Polymethylmethacrylate cement with gentamicin was prepared in the usual fashion using a vacuum mixer. Cement was applied to the cut surface of the proximal tibia as well as along the undersurface of a size 6 rotating platform tibial component. Tibial component was positioned and impacted into place. Excess cement was removed using [employer] fast streamer. Cement was then applied to the cut surfaces of the femur as well as along the posterior flanges of the size 6N femoral component. The femoral component was positioned and impacted into place. Excess cement was removed using [employer] fast streamer. A 5 mm polyethylene trial was inserted and the knee was brought into full extension with steady axial compression applied. Finally, cement was applied to the backside of a 38 mm medialized dome patella and the patellar component was positioned and patellar clamp applied. Excess cement was removed using [employer] fast streamer. After adequate curing of the cement, the tourniquet was deflated after a total tourniquet time of 88 minutes. Hemostasis was achieved using electrocautery. The knee was irrigated  with copious amounts of normal saline using pulsatile lavage followed by 500 ml of Surgiphor and then suctioned dry. 20 mL of 1.3% Exparel and 60 mL of 0.25% Marcaine in 40 mL of normal saline was injected along the posterior capsule, medial and lateral gutters, and along the arthrotomy site. A 5 mm stabilized rotating platform polyethylene insert was inserted and the knee was placed through a range of motion with excellent mediolateral soft tissue balancing appreciated and excellent patellar tracking noted. 2 medium drains were placed in the wound bed and brought out through separate stab incisions. The medial parapatellar portion of the incision was reapproximated using interrupted sutures of #1 Vicryl. Subcutaneous tissue was approximated in layers using first #0 Vicryl followed #2-0 Vicryl. The skin was approximated with skin staples. A sterile dressing was applied.  The patient tolerated the procedure well and was transported to the recovery room in stable condition.    Andron Marrazzo P. Holley Bouche., M.D.

## 2020-09-11 DIAGNOSIS — M1711 Unilateral primary osteoarthritis, right knee: Secondary | ICD-10-CM | POA: Diagnosis not present

## 2020-09-11 MED ORDER — ENOXAPARIN SODIUM 40 MG/0.4ML IJ SOSY: 40.0000 mg | PREFILLED_SYRINGE | INTRAMUSCULAR | 0 refills | Status: AC

## 2020-09-11 MED ORDER — OXYCODONE HCL 5 MG PO TABS: 5.0000 mg | ORAL_TABLET | ORAL | 0 refills | Status: AC | PRN

## 2020-09-11 MED ORDER — CELECOXIB 200 MG PO CAPS: 200.0000 mg | ORAL_CAPSULE | Freq: Two times a day (BID) | ORAL | 0 refills | Status: AC

## 2020-09-11 NOTE — Progress Notes (Signed)
  Subjective: 1 Day Post-Op Procedure(s) (LRB): COMPUTER ASSISTED TOTAL KNEE ARTHROPLASTY (Right) Patient reports pain as well-controlled.   Patient is well, and has had no acute complaints or problems Plan is to go Home after hospital stay. Negative for chest pain and shortness of breath Fever: no Gastrointestinal: negative for nausea and vomiting.  Patient has not had a bowel movement.  Objective: Vital signs in last 24 hours: Temp:  [97.4 F (36.3 C)-98.3 F (36.8 C)] 97.8 F (36.6 C) (06/07 0518) Pulse Rate:  [51-70] 62 (06/07 0518) Resp:  [13-18] 17 (06/07 0518) BP: (97-137)/(48-67) 121/54 (06/07 0518) SpO2:  [94 %-100 %] 95 % (06/07 0518)  Intake/Output from previous day:  Intake/Output Summary (Last 24 hours) at 09/11/2020 0756 Last data filed at 09/11/2020 0522 Gross per 24 hour  Intake 2293.93 ml  Output 1000 ml  Net 1293.93 ml    Intake/Output this shift: No intake/output data recorded.  Labs: No results for input(s): HGB in the last 72 hours. No results for input(s): WBC, RBC, HCT, PLT in the last 72 hours. No results for input(s): NA, K, CL, CO2, BUN, CREATININE, GLUCOSE, CALCIUM in the last 72 hours. No results for input(s): LABPT, INR in the last 72 hours.   EXAM General - Patient is Alert, Appropriate and Oriented Extremity - Neurovascular intact Dorsiflexion/Plantar flexion intact Compartment soft Dressing/Incision -Postoperative dressing remains in place., Polar Care in place and working. , Hemovac in place.  Motor Function - intact, moving foot and toes well on exam.  Unable to perform SLR Cardiovascular- Regular rate and rhythm, no murmurs/rubs/gallops Respiratory- Lungs clear to auscultation bilaterally Gastrointestinal- soft, nontender and active bowel sounds   Assessment/Plan: 1 Day Post-Op Procedure(s) (LRB): COMPUTER ASSISTED TOTAL KNEE ARTHROPLASTY (Right) Active Problems:   Total knee replacement status  Estimated body mass index is  37.71 kg/m as calculated from the following:   Height as of this encounter: 5\' 8"  (1.727 m).   Weight as of this encounter: 112.5 kg. Advance diet Up with therapy   DVT Prophylaxis - Lovenox, Ted hose and foot pumps Weight-Bearing as tolerated to right leg  Cassell Smiles, PA-C [facility] Orthopaedic Surgery 09/11/2020, 7:56 AM

## 2020-09-11 NOTE — Discharge Summary (Signed)
Physician Discharge Summary  Patient ID: Gail Burns MRN: [id number] DOB/AGE: 12/14/1945 75 y.o.  Admit date: 09/10/2020 Discharge date: 09/11/2020  Admission Diagnoses:  Total knee replacement status [Z96.659]  Surgeries:Procedure(s): Right total knee arthroplasty using computer-assisted navigation  SURGEON:  Marciano Sequin. M.D.  ASSISTANT: Cassell Smiles, PA-C (present and scrubbed throughout the case, critical for assistance with exposure, retraction, instrumentation, and closure)  ANESTHESIA: spinal  ESTIMATED BLOOD LOSS: 50 mL  FLUIDS REPLACED: 1200 mL of crystalloid  TOURNIQUET TIME: 88 minutes  DRAINS: 2 medium Hemovac  SOFT TISSUE RELEASES: Anterior cruciate ligament, posterior cruciate ligament, deep medial collateral ligament, patellofemoral ligament  IMPLANTS UTILIZED: DePuy Attune size 6N posterior stabilized femoral component (cemented), size 6 rotating platform tibial component (cemented), 38 mm medialized dome patella (cemented), and a 5 mm stabilized rotating platform polyethylene insert.   Discharge Diagnoses: Patient Active Problem List   Diagnosis Date Noted  . Total knee replacement status 09/10/2020  . Severe obesity (BMI 35.0-35.9 with comorbidity) ([place]) 08/27/2020  . Aorto-iliac atherosclerosis ([place]) 07/09/2020  . Primary osteoarthritis of right knee 05/20/2020  . Pseudophakia of right eye 02/22/2018  . Mixed hyperlipidemia 01/31/2014  . Abnormal colonoscopy 01/23/2014  . Personal history of colonic polyps 12/30/2013  . Pancreatic duct obstruction 01/26/2013  . Subacute pancreatic necrosis 01/26/2013  . Dyspnea 09/20/2012  . Hyponatremia 08/18/2012  . Hypoxemia 08/17/2012  . Anemia 08/16/2012  . GERD (gastroesophageal reflux disease) 08/09/2012  . History of pancreatitis 08/09/2012  . HTN (hypertension) 08/09/2012  . Hypokalemia 08/09/2012  . Leukocytosis 08/09/2012  . Macular degeneration 08/09/2012  . Renal insufficiency  08/09/2012  . S/P laparoscopic cholecystectomy 08/08/2012  . Abnormal LFTs 08/08/2012  . Pure hypercholesterolemia 03/16/2012  . Other abnormal glucose 03/16/2012  . Essential hypertension, benign 03/16/2012  . Coronary artery disease 03/16/2012  . Depression 03/16/2012    Past Medical History:  Diagnosis Date  . Anxiety   . Aorto-iliac atherosclerosis ([place])   . Basal cell carcinoma    face, chest, legs  . Benign neoplasm of colon   . Coronary atherosclerosis of native coronary artery   . Degeneration of lumbar or lumbosacral intervertebral disc   . Depression   . Diarrhea   . Diverticulosis of colon (without mention of hemorrhage)   . Family history of adverse reaction to anesthesia    twin sister has hard time waking up from anesthesia  . GERD (gastroesophageal reflux disease)   . H/O acute pancreatitis   . Hyperlipidemia   . Hypertension   . Impaired glucose tolerance test   . Internal hemorrhoids without mention of complication   . Macular degeneration   . Obesity, unspecified   . Osteoarthritis    right knee  . Other dyspnea and respiratory abnormality   . Other malaise and fatigue   . Pancreatitis due to biliary obstruction 08/05/2012   two week admission in [place].  Step down unit.  . Seborrheic dermatitis, unspecified   . Tobacco use disorder   . Unspecified disorder of kidney and ureter      Transfusion:    Consultants (if any):   Discharged Condition: Improved  Hospital Course: Gail Burns is an 75 y.o. female who was admitted 09/10/2020 with a diagnosis of right knee osteoarthritis and went to the operating room on 09/10/2020 and underwent right total knee arthroplasty. The patient received perioperative antibiotics for prophylaxis (see below). The patient tolerated the procedure well and was transported to PACU in stable condition. After meeting PACU criteria,  the patient was subsequently transferred to the Orthopaedics/Rehabilitation unit.   The patient  received DVT prophylaxis in the form of early mobilization, Lovenox, Foot Pumps and TED hose. A sacral pad had been placed and heels were elevated off of the bed with rolled towels in order to protect skin integrity. Foley catheter was discontinued on postoperative day #0. Wound drains were discontinued on postoperative day #1. The surgical incision was healing well without signs of infection.  Physical therapy was initiated postoperatively for transfers, gait training, and strengthening. Occupational therapy was initiated for activities of daily living and evaluation for assisted devices. Rehabilitation goals were reviewed in detail with the patient. The patient made steady progress with physical therapy and physical therapy recommended discharge to Home.   The patient achieved the preliminary goals of this hospitalization and was felt to be medically and orthopaedically appropriate for discharge.  She was given perioperative antibiotics:  Anti-infectives (From admission, onward)   Start     Dose/Rate Route Frequency Ordered Stop   09/10/20 1400  ceFAZolin (ANCEF) IVPB 2g/100 mL premix        2 g 200 mL/hr over 30 Minutes Intravenous Every 6 hours 09/10/20 1223 09/10/20 2046   09/10/20 0616  ceFAZolin (ANCEF) 2-4 GM/100ML-% IVPB       Note to Pharmacy: [place] [facility], [place]: cabinet override      09/10/20 0616 09/10/20 2046   09/10/20 0600  ceFAZolin (ANCEF) IVPB 2g/100 mL premix        2 g 200 mL/hr over 30 Minutes Intravenous On call to O.R. 09/10/20 0025 09/10/20 0751    .  Recent vital signs:  Vitals:   09/11/20 0807 09/11/20 1138  BP: (!) 134/54 (!) 125/55  Pulse: 64 (!) 59  Resp: 18 18  Temp: 98.2 F (36.8 C) 97.8 F (36.6 C)  SpO2: 96% 95%    Recent laboratory studies:  No results for input(s): WBC, HGB, HCT, PLT, K, CL, CO2, BUN, CREATININE, GLUCOSE, CALCIUM, LABPT, INR in the last 72 hours.  Diagnostic Studies: DG Knee Right Port  Result Date: 09/10/2020 CLINICAL DATA:   Status post total knee replacement EXAM: PORTABLE RIGHT KNEE - 1-2 VIEW COMPARISON:  None. FINDINGS: Frontal and lateral views were obtained. Patient is status post total knee replacement with prosthetic components well-seated. No fracture or dislocation. No erosion. Overlying skin staples noted. Drain in knee joint noted. IMPRESSION: Status post total knee replacement with prosthetic components well-seated. No acute fracture or dislocation. Acute postoperative changes noted. Electronically Signed   By: Lowella Grip III M.D.   On: 09/10/2020 11:35    Discharge Medications:   Allergies as of 09/11/2020      Reactions   Augmentin [amoxicillin-pot Clavulanate] Hives   IgE = 4 (WNL) on 08/29/2020      Medication List    STOP taking these medications   aspirin 81 MG tablet   ibuprofen 200 MG tablet Commonly known as: ADVIL     TAKE these medications   acetaminophen 500 MG tablet Commonly known as: TYLENOL Take 1,000 mg by mouth 2 (two) times daily as needed.   calcium carbonate 600 MG Tabs tablet Commonly known as: OS-CAL Take 600 mg by mouth daily with supper.   celecoxib 200 MG capsule Commonly known as: CELEBREX Take 1 capsule (200 mg total) by mouth 2 (two) times daily.   enoxaparin 40 MG/0.4ML injection Commonly known as: LOVENOX Inject 0.4 mLs (40 mg total) into the skin daily for 14 days.  escitalopram 20 MG tablet Commonly known as: LEXAPRO Take 1 tablet (20 mg total) by mouth daily.   ezetimibe 10 MG tablet Commonly known as: ZETIA Take 10 mg by mouth daily.   fenofibrate 145 MG tablet Commonly known as: TRICOR Take 145 mg by mouth daily with supper.   LUTEIN VISION BLEND PO Take 1 capsule by mouth daily with supper.   multivitamin tablet Take 1 tablet by mouth daily.   omeprazole 20 MG capsule Commonly known as: PRILOSEC Take 20 mg by mouth daily.   OVER THE COUNTER MEDICATION Take 1 capsule by mouth daily. Cell Wise   oxyCODONE 5 MG immediate  release tablet Commonly known as: Oxy IR/ROXICODONE Take 1 tablet (5 mg total) by mouth every 4 (four) hours as needed for moderate pain (pain score 4-6).   rosuvastatin 40 MG tablet Commonly known as: CRESTOR TAKE 1 TABLET DAILY What changed:   how much to take  how to take this  when to take this            Durable Medical Equipment  (From admission, onward)         Start     Ordered   09/10/20 1224  DME Walker rolling  Once       Question:  Patient needs a walker to treat with the following condition  Answer:  Total knee replacement status   09/10/20 1223   09/10/20 1224  DME Bedside commode  Once       Question:  Patient needs a bedside commode to treat with the following condition  Answer:  Total knee replacement status   09/10/20 1223          Disposition: Home with home health PT     Follow-up Information    [facility] Heir On 09/25/2020.   Specialty: Orthopedic Surgery Why: at 1:15pm Contact information: [place] [address] [phone]        Dereck Leep, MD On 10/25/2020.   Specialty: Orthopedic Surgery Why: at 9:30am Contact information: [place] [address] [place], PA-C 09/11/2020, 1:51 PM

## 2020-09-11 NOTE — Plan of Care (Signed)
  Problem: Education: Goal: Knowledge of General Education information will improve Description: Including pain rating scale, medication(s)/side effects and non-pharmacologic comfort measures Outcome: Progressing   Problem: Health Behavior/Discharge Planning: Goal: Ability to manage health-related needs will improve Outcome: Progressing   Problem: Clinical Measurements: Goal: Ability to maintain clinical measurements within normal limits will improve Outcome: Progressing Goal: Will remain free from infection Outcome: Progressing Goal: Diagnostic test results will improve Outcome: Progressing Goal: Respiratory complications will improve Outcome: Progressing Goal: Cardiovascular complication will be avoided Outcome: Progressing  DC instructions reviewed with pt - understanding voiced

## 2020-09-11 NOTE — TOC Progression Note (Signed)
Transition of Care [facility]) - Progression Note    Patient Details  Name: Gail Burns MRN: [id number] Date of Birth: 08-31-1945  Transition of Care [facility]) CM/SW [place], RN Phone Number: 09/11/2020, 2:54 PM  Clinical Narrative:   Met with the patient in the room, she has DME at home she is set up with [facility] for [facility], She has transportation and can afford her medication          Expected Discharge Plan and Services           Expected Discharge Date: 09/11/20                                     Social Determinants of Health (SDOH) Interventions    Readmission Risk Interventions No flowsheet data found.

## 2020-09-11 NOTE — Anesthesia Postprocedure Evaluation (Signed)
Anesthesia Post Note  Patient: Gail Burns  Procedure(s) Performed: COMPUTER ASSISTED TOTAL KNEE ARTHROPLASTY (Right Knee)  Patient location during evaluation: Nursing Unit Anesthesia Type: Spinal Level of consciousness: oriented and awake and alert Pain management: pain level controlled Vital Signs Assessment: post-procedure vital signs reviewed and stable Respiratory status: spontaneous breathing and respiratory function stable Cardiovascular status: blood pressure returned to baseline and stable Postop Assessment: no headache, no backache, no apparent nausea or vomiting and patient able to bend at knees Anesthetic complications: no   No complications documented.   Last Vitals:  Vitals:   09/11/20 0015 09/11/20 0518  BP: 125/65 (!) 121/54  Pulse: 69 62  Resp: 17 17  Temp: 36.7 C 36.6 C  SpO2: 94% 95%    Last Pain:  Vitals:   09/11/20 0518  TempSrc: Oral  PainSc:                  Norm Salt

## 2020-09-11 NOTE — Plan of Care (Signed)
Post-op dressing removed. , Hemovac removed. and Mini compression dressing applied.
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
## 2020-09-11 NOTE — Progress Notes (Signed)
Physical Therapy Treatment Patient Details Name: Gail Burns MRN: [id number] DOB: 14-Jul-1945 Today's Date: 09/11/2020    History of Present Illness Pt is a 75 yo female s/p R TKA,WBAT. PMH of former smoker, CAD, anxiety, depression, renal disease, GERD, HTN.    PT Comments    Patient alert, reported 6/10 R knee pain especially with ambulation and exercises. The patient demonstrated excellent progress towards goals this session. Sit <> stand with RW and supervision, cued for technique to minimize pain with good follow through. She was able to ambulate ~242ft with RW and CGA, progressed to step through gait pattern, good weight bearing with LEs. Stair training also performed with CGA, steady, safe, adequate for discharge. Pt returned to room, all needs in reach. The patient would benefit from further skilled PT intervention to continue to progress towards goals. Recommendation remains appropriate.     Follow Up Recommendations  Home health PT;Supervision - Intermittent     Equipment Recommendations  3in1 (PT)    Recommendations for Other Services       Precautions / Restrictions Precautions Precautions: Knee;Fall Precaution Booklet Issued: No Restrictions Weight Bearing Restrictions: Yes RLE Weight Bearing: Weight bearing as tolerated    Mobility  Bed Mobility               General bed mobility comments: up in chair at start/end of session    Transfers Overall transfer level: Needs assistance Equipment used: Rolling walker (2 wheeled) Transfers: Sit to/from Stand Sit to Stand: Supervision         General transfer comment: cued to bring RLE forward prior to sitting for pain management  Ambulation/Gait   Gait Distance (Feet): 220 Feet Assistive device: Rolling walker (2 wheeled)       General Gait Details: progressed from step to gait, to step through gait, antalgic gait noted.   Stairs Stairs: Yes Stairs assistance: Min guard Stair Management: Two  rails;Step to pattern Number of Stairs: 4 General stair comments: steady, safe   Wheelchair Mobility    Modified Rankin (Stroke Patients Only)       Balance Overall balance assessment: Needs assistance Sitting-balance support: Feet supported Sitting balance-Leahy Scale: Good       Standing balance-Leahy Scale: Fair                              Cognition Arousal/Alertness: Awake/alert Behavior During Therapy: WFL for tasks assessed/performed Overall Cognitive Status: Within Functional Limits for tasks assessed                                        Exercises Total Joint Exercises Ankle Circles/Pumps: AROM;Both;15 reps Long Arc Quad: AROM;Strengthening;Right;10 reps Knee Flexion: AROM;Seated;Right;10 reps Goniometric ROM: 0 - and around 85 degrees    General Comments        Pertinent Vitals/Pain Pain Assessment: 0-10 Pain Score: 6  Pain Location: R knee Pain Descriptors / Indicators: Guarding;Grimacing;Sore Pain Intervention(s): Limited activity within patient's tolerance;Monitored during session;Premedicated before session;Repositioned;Ice applied    Home Living                      Prior Function            PT Goals (current goals can now be found in the care plan section) Progress towards PT goals: Progressing toward goals  Frequency    BID      PT Plan Current plan remains appropriate    Co-evaluation              AM-PAC PT "6 Clicks" Mobility   Outcome Measure  Help needed turning from your back to your side while in a flat bed without using bedrails?: None Help needed moving from lying on your back to sitting on the side of a flat bed without using bedrails?: A Little Help needed moving to and from a bed to a chair (including a wheelchair)?: None Help needed standing up from a chair using your arms (e.g., wheelchair or bedside chair)?: None Help needed to walk in hospital room?: A Little Help  needed climbing 3-5 steps with a railing? : A Little 6 Click Score: 21    End of Session Equipment Utilized During Treatment: Gait belt Activity Tolerance: Patient tolerated treatment well Patient left: in chair;with chair alarm set;with call bell/phone within reach;with family/visitor present Nurse Communication: Mobility status PT Visit Diagnosis: Other abnormalities of gait and mobility (R26.89);Pain;Difficulty in walking, not elsewhere classified (R26.2);Muscle weakness (generalized) (M62.81) Pain - Right/Left: Right Pain - part of body: Knee     Time: 8016-5537 PT Time Calculation (min) (ACUTE ONLY): 25 min  Charges:  $Gait Training: 8-22 mins $Therapeutic Exercise: 8-22 mins                     Lieutenant Diego PT, DPT 10:28 AM,09/11/20

## 2020-10-12 ENCOUNTER — Other Ambulatory Visit: Payer: Self-pay | Admitting: Family Medicine

## 2020-10-12 ENCOUNTER — Other Ambulatory Visit: Payer: Self-pay | Admitting: Physician Assistant

## 2020-10-12 DIAGNOSIS — R921 Mammographic calcification found on diagnostic imaging of breast: Secondary | ICD-10-CM

## 2020-10-23 ENCOUNTER — Ambulatory Visit
Admission: RE | Admit: 2020-10-23 | Discharge: 2020-10-23 | Disposition: A | Discharge status: Home / Self Care | Payer: Medicare Other | Source: Ambulatory Visit | Attending: Family Medicine | Admitting: Family Medicine

## 2020-10-23 ENCOUNTER — Other Ambulatory Visit: Payer: Self-pay

## 2020-10-23 DIAGNOSIS — R921 Mammographic calcification found on diagnostic imaging of breast: Secondary | ICD-10-CM

## 2021-09-11 ENCOUNTER — Other Ambulatory Visit: Payer: Self-pay | Admitting: Family Medicine

## 2021-09-11 DIAGNOSIS — Z1231 Encounter for screening mammogram for malignant neoplasm of breast: Secondary | ICD-10-CM

## 2021-10-24 ENCOUNTER — Ambulatory Visit
Admission: RE | Admit: 2021-10-24 | Discharge: 2021-10-24 | Disposition: A | Discharge status: Home / Self Care | Payer: Medicare Other | Source: Ambulatory Visit | Attending: Family Medicine | Admitting: Family Medicine

## 2021-10-24 DIAGNOSIS — Z1231 Encounter for screening mammogram for malignant neoplasm of breast: Secondary | ICD-10-CM | POA: Insufficient documentation

## 2022-03-12 ENCOUNTER — Emergency Department
Admission: EM | Admit: 2022-03-12 | Discharge: 2022-03-12 | Disposition: A | Discharge status: Home / Self Care | Payer: Medicare Other | Attending: Emergency Medicine | Admitting: Emergency Medicine

## 2022-03-12 ENCOUNTER — Other Ambulatory Visit: Payer: Self-pay

## 2022-03-12 DIAGNOSIS — L03116 Cellulitis of left lower limb: Secondary | ICD-10-CM | POA: Diagnosis not present

## 2022-03-12 MED ORDER — CEPHALEXIN 500 MG PO CAPS
500.0000 mg | ORAL_CAPSULE | Freq: Once | ORAL | Status: AC
  Administered 2022-03-12: 500 mg via ORAL
  Filled 2022-03-12: qty 1

## 2022-03-12 MED ORDER — CEPHALEXIN 500 MG PO CAPS: 500.0000 mg | ORAL_CAPSULE | Freq: Four times a day (QID) | ORAL | 0 refills | Status: AC
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
## 2022-03-12 NOTE — Discharge Instructions (Signed)
Please take antibiotic as prescribed.  You can apply topical antibiotic ointment daily.  Continue to monitor area of redness to see if it is exceeding the skin pen mark.  If worsening redness, pain, I recommend returning to the ER.

## 2022-04-06 ENCOUNTER — Other Ambulatory Visit: Payer: Self-pay

## 2022-04-06 ENCOUNTER — Emergency Department
Admission: EM | Admit: 2022-04-06 | Discharge: 2022-04-06 | Disposition: A | Discharge status: Home / Self Care | Payer: Medicare Other | Attending: Emergency Medicine | Admitting: Emergency Medicine

## 2022-04-06 ENCOUNTER — Emergency Department: Payer: Medicare Other

## 2022-04-06 DIAGNOSIS — J069 Acute upper respiratory infection, unspecified: Secondary | ICD-10-CM | POA: Insufficient documentation

## 2022-04-06 DIAGNOSIS — Z8616 Personal history of COVID-19: Secondary | ICD-10-CM | POA: Insufficient documentation

## 2022-04-06 DIAGNOSIS — R059 Cough, unspecified: Secondary | ICD-10-CM | POA: Diagnosis present

## 2022-04-06 MED ORDER — AZITHROMYCIN 250 MG PO TABS: ORAL_TABLET | ORAL | 0 refills | Status: AC

## 2022-04-06 NOTE — ED Provider Notes (Signed)
[facility] Provider Note    Event Date/Time   First MD Initiated Contact with Patient 04/06/22 1255     (approximate)   History   URI   HPI  Gail Burns is a 76 y.o. female presents to the emergency department with ongoing cough.  States that she was diagnosed with COVID 2 weeks ago and since that time she has been having an ongoing cough.  She did have complications of what she describes as a sinus infection and states that she saw her primary care physician and was started on moxifloxacin.  Denies any posttussis emesis.  Her immunizations are up-to-date, she is uncertain if she has had a recent pertussis booster.  Denies any abdominal pain nausea or vomiting.  No dysuria.  Denies any shortness of breath or chest pain.     Physical Exam   Triage Vital Signs: ED Triage Vitals  Enc Vitals Group     BP 04/06/22 1211 (!) 147/69     Pulse Rate 04/06/22 1211 84     Resp 04/06/22 1211 18     Temp 04/06/22 1216 98.1 F (36.7 C)     Temp Source 04/06/22 1216 Oral     SpO2 04/06/22 1211 96 %     Weight 04/06/22 1212 240 lb (108.9 kg)     Height 04/06/22 1212 '5\' 8"'$  (1.727 m)     Head Circumference --      Peak Flow --      Pain Score 04/06/22 1212 0     Pain Loc --      Pain Edu? --      Excl. in [place]? --     Most recent vital signs: Vitals:   04/06/22 1300 04/06/22 1329  BP: (!) 134/55 (!) 128/57  Pulse: 72 65  Resp:  18  Temp:    SpO2: 94% 100%    Physical Exam Constitutional:      Appearance: She is well-developed.  HENT:     Head: Atraumatic.  Eyes:     Conjunctiva/sclera: Conjunctivae normal.  Cardiovascular:     Rate and Rhythm: Regular rhythm.  Pulmonary:     Effort: No respiratory distress.     Breath sounds: No wheezing.  Abdominal:     General: There is no distension.  Musculoskeletal:        General: Normal range of motion.     Cervical back: Normal range of motion.  Skin:    General: Skin is warm.  Neurological:      Mental Status: She is alert. Mental status is at baseline.      IMPRESSION / MDM / ASSESSMENT AND PLAN / ED COURSE  I reviewed the triage vital signs and the nursing notes.  Differential diagnosis including viral illness including COVID/influenza, bacterial pneumonia, pertussis   RADIOLOGY I independently reviewed imaging, my interpretation of imaging: Chest x-ray with no focal consistent with pneumonia.  No signs of cardiomegaly or pulmonary edema.   Labs (all labs ordered are listed, but only abnormal results are displayed) Labs interpreted as -    Labs Reviewed - No data to display    Will treat the patient with a course of azithromycin.  Discussed follow-up with primary care physician.  Discussed symptomatic treatment for cough.  Given return precautions for any worsening symptoms.   PROCEDURES:  Critical Care performed: No  Procedures  Patient's presentation is most consistent with acute illness / injury with system symptoms.   MEDICATIONS ORDERED IN  ED: Medications - No data to display  FINAL CLINICAL IMPRESSION(S) / ED DIAGNOSES   Final diagnoses:  Upper respiratory tract infection, unspecified type     Rx / DC Orders   ED Discharge Orders          Ordered    azithromycin (ZITHROMAX Z-PAK) 250 MG tablet        04/06/22 1314             Note:  This document was prepared using Dragon voice recognition software and may include unintentional dictation errors.   Nathaniel Man, MD 04/06/22 [id number]

## 2022-04-06 NOTE — ED Triage Notes (Signed)
Pt states she was diagnosed with covid 2 weeks ago and then a week ago she was seen at [facility] and was prescribed some antibiotics for a sinus infection- pt states she does not feel any better and last time this happened in April she had pneumonia- pt states they did not do a chest xray

## 2022-05-19 IMAGING — DX DG KNEE 1-2V PORT*R*
2 series · 2 of 2 positions shown · non-contrast
Comparison: None.

CLINICAL DATA: Status post total knee replacement

EXAM:
PORTABLE RIGHT KNEE - 1-2 VIEW

[knee ap]
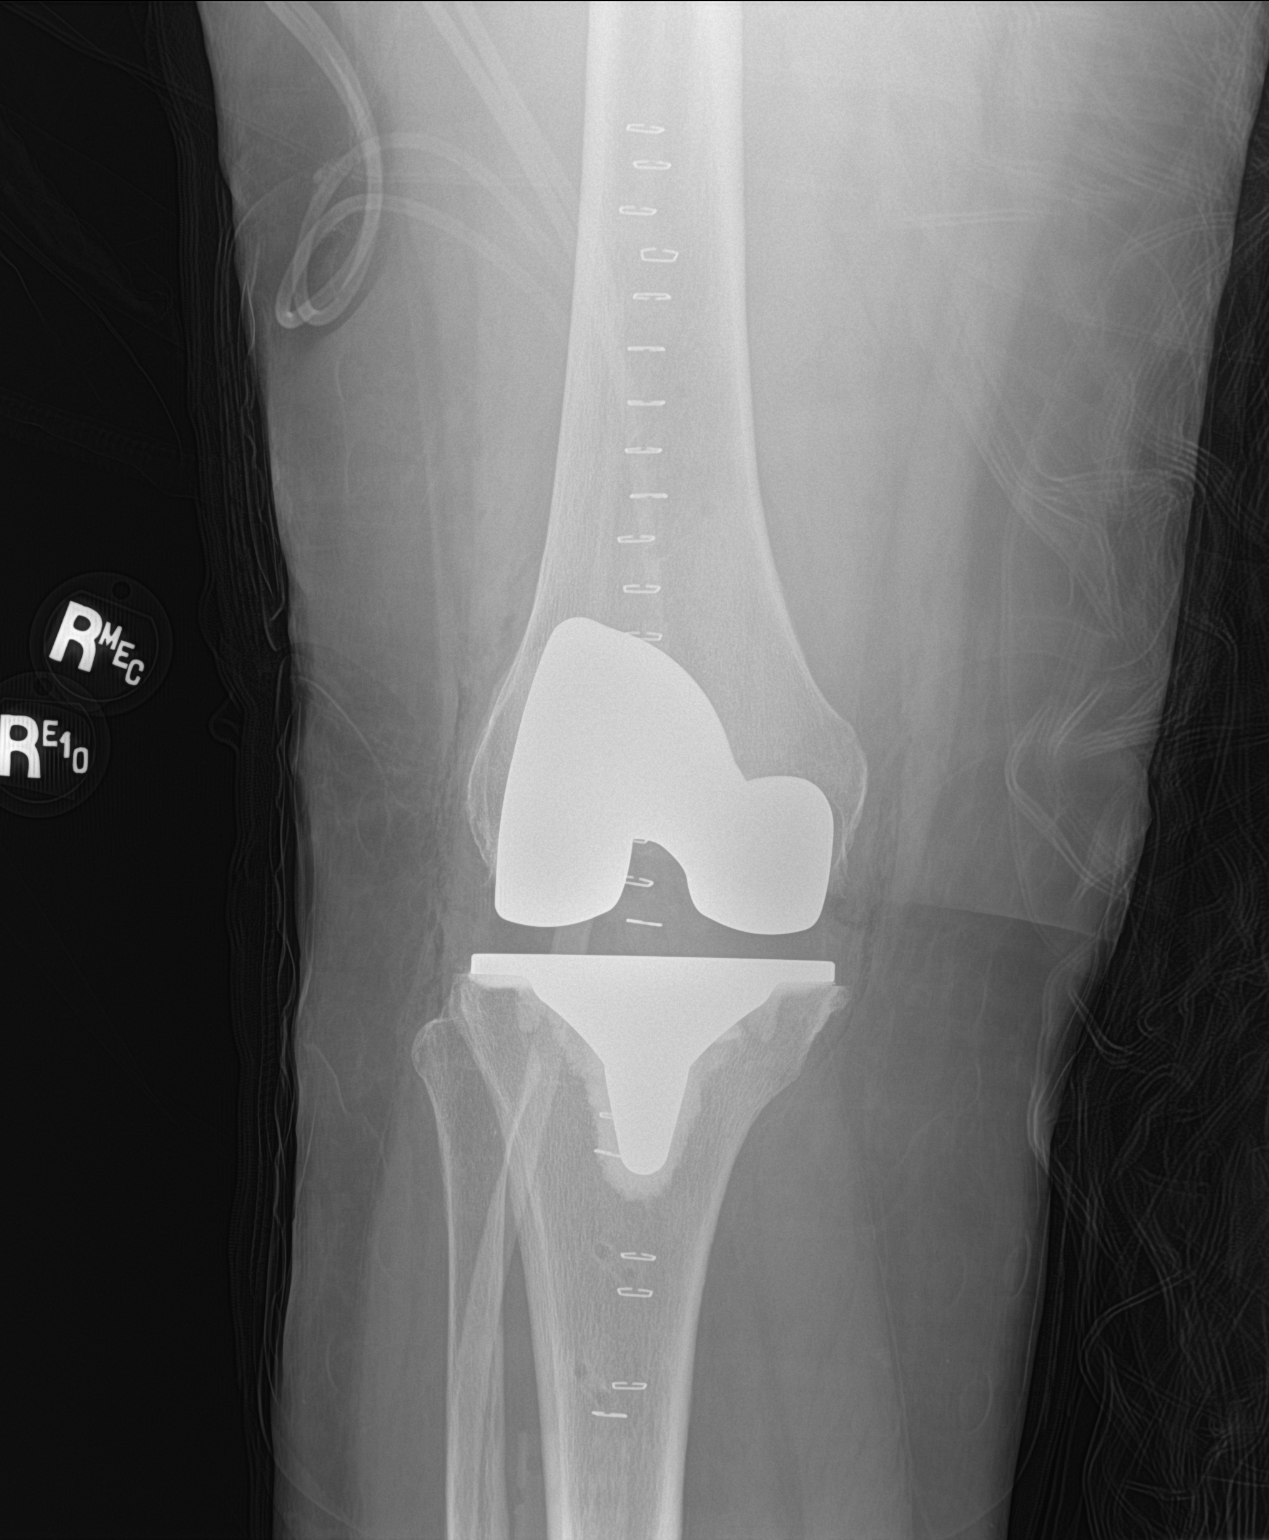

[knee lat]
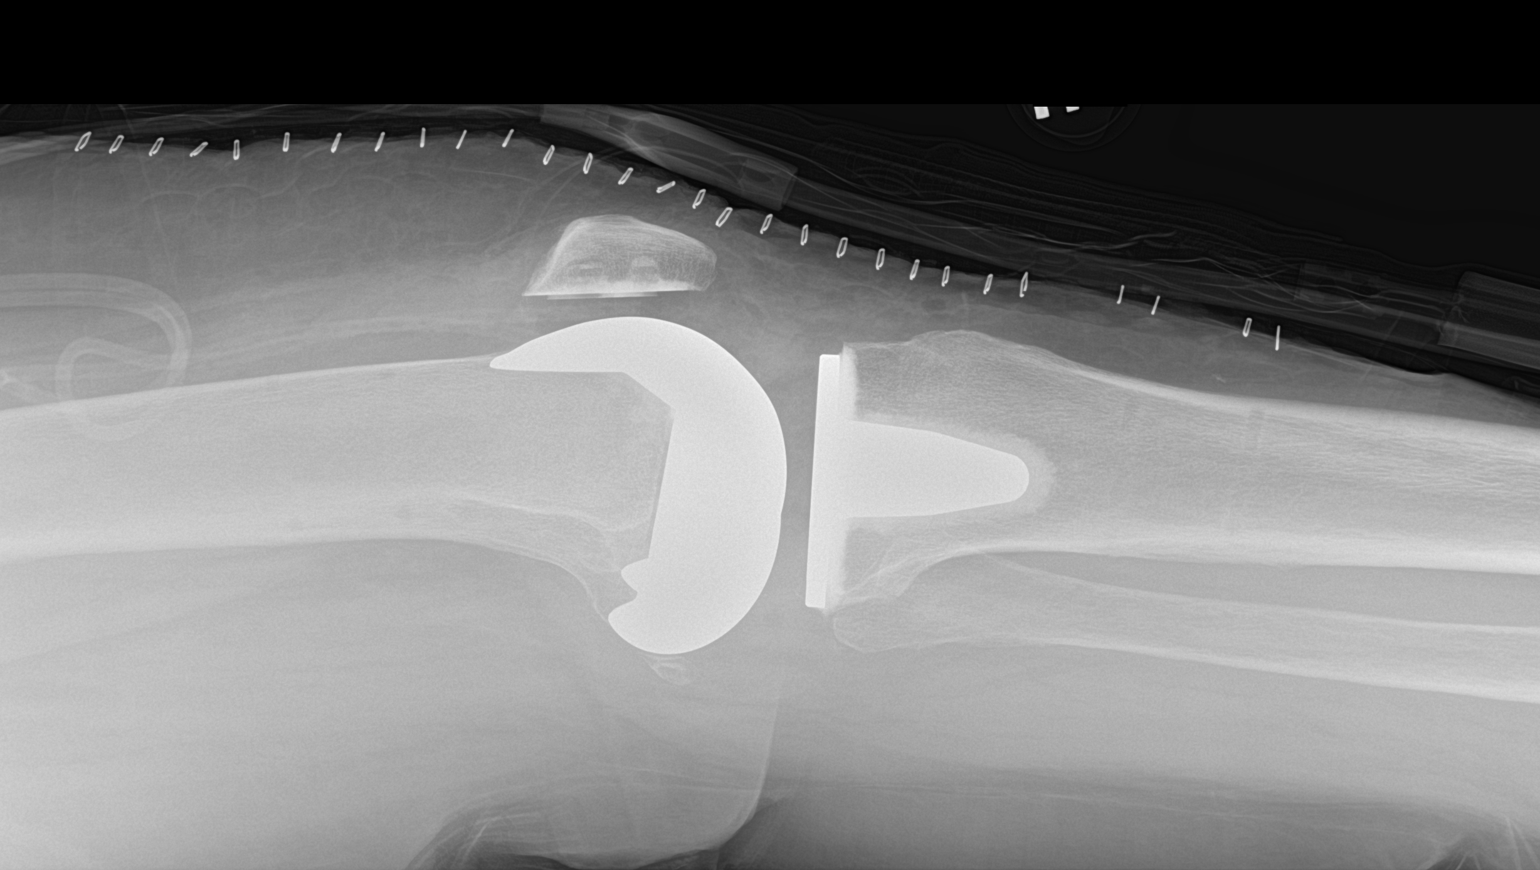

[2 of 2 positions shown; findings below may reference images not displayed]

FINDINGS: Frontal and lateral views were obtained. Patient is status post
total knee replacement with prosthetic components well-seated. No
fracture or dislocation. No erosion. Overlying skin staples noted.
Drain in knee joint noted.
IMPRESSION: Status post total knee replacement with prosthetic components
well-seated. No acute fracture or dislocation. Acute postoperative
changes noted.

## 2022-07-11 ENCOUNTER — Encounter: Payer: Medicare Other | Attending: Family Medicine | Admitting: Skilled Nursing Facility1

## 2022-07-11 ENCOUNTER — Encounter: Payer: Self-pay | Admitting: Skilled Nursing Facility1

## 2022-07-11 DIAGNOSIS — E119 Type 2 diabetes mellitus without complications: Secondary | ICD-10-CM | POA: Diagnosis present

## 2022-07-11 DIAGNOSIS — Z713 Dietary counseling and surveillance: Secondary | ICD-10-CM | POA: Diagnosis not present

## 2022-07-11 NOTE — Progress Notes (Signed)
Diabetes Self-Management Education  Visit Type: First/Initial   07/11/2022  Gail Burns, identified by name and date of birth, is a 77 y.o. female with a diagnosis of Diabetes: Type 2.   ASSESSMENT  There were no vitals taken for this visit. There is no height or weight on file to calculate BMI.  DM: No medications  A1C 6.7 Creatinine 1.1  Other DX: Depression Anxiety Arthritis Cancer Hyperlipidemia HTN Kidney disease   Pt states she was not open to Ozempic before but is stating she is more open now.  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 Pt states she knows she over snacks.  Pt states 2 times a week she does weight lifting.    Goals: Go for 1-3 10 minute walks 3-4 times a week Try a youtube video when the outside temp is not ideal Try knitting at work instead of snacking while bored  Choose complex carbohydrates    Diabetes Self-Management Education - 07/11/22 1038       Visit Information   Visit Type First/Initial      Initial Visit   Diabetes Type Type 2    Are you currently following a meal plan? No    Are you taking your medications as prescribed? Not on Medications      Health Coping   How would you rate your overall health? Good      Psychosocial Assessment   Patient Belief/Attitude about Diabetes Motivated to manage diabetes    What is the hardest part about your diabetes right now, causing you the most concern, or is the most worrisome to you about your diabetes?   Making healty food and beverage choices    Self-management support Family    Patient Concerns Nutrition/Meal planning;Weight Control    Special Needs None      Pre-Education Assessment   Patient understands the diabetes disease and treatment process. Needs Instruction    Patient understands incorporating nutritional management into lifestyle. Needs Instruction    Patient undertands incorporating physical activity into lifestyle. Needs Instruction     Patient understands using medications safely. Needs Instruction    Patient understands monitoring blood glucose, interpreting and using results Needs Instruction    Patient understands prevention, detection, and treatment of acute complications. Needs Instruction    Patient understands prevention, detection, and treatment of chronic complications. Needs Instruction    Patient understands how to develop strategies to address psychosocial issues. Needs Instruction    Patient understands how to develop strategies to promote health/change behavior. Needs Instruction      Complications   Last HgB A1C per patient/outside source 6.7 %    How often do you check your blood sugar? 0 times/day (not testing)    Have you had a dilated eye exam in the past 12 months? Yes    Have you had a dental exam in the past 12 months? No    Are you checking your feet? No      Dietary Intake   Breakfast spark    Snack (morning) 9:30 pack of crackers    Lunch pre made salad: carrots, cheese, ham, [place] and ranch    Snack (afternoon) pretzels and fritos and little chocolates    Dinner eaten out or pasta    Snack (evening) choclate doughnuts    Beverage(s) water + flavoring, diet coke, plain water sometimes      Activity / Exercise   Activity / Exercise Type Light (walking / raking leaves)  How many days per week do you exercise? 2    How many minutes per day do you exercise? 20    Total minutes per week of exercise 40      Patient Education   Previous Diabetes Education No    Disease Pathophysiology Definition of diabetes, type 1 and 2, and the diagnosis of diabetes;Factors that contribute to the development of diabetes    Healthy Eating Role of diet in the treatment of diabetes and the relationship between the three main macronutrients and blood glucose level;Food label reading, portion sizes and measuring food.;Plate Method;Information on hints to eating out and maintain blood glucose control.    Being  Active Role of exercise on diabetes management, blood pressure control and cardiac health.;Identified with patient nutritional and/or medication changes necessary with exercise.    Monitoring Taught/evaluated SMBG meter.;Yearly dilated eye exam;Daily foot exams;Interpreting lab values - A1C, lipid, urine microalbumina.    Acute complications Taught prevention, symptoms, and  treatment of hypoglycemia - the 15 rule.;Discussed and identified patients' prevention, symptoms, and treatment of hyperglycemia.    Diabetes Stress and Support Role of stress on diabetes      Individualized Goals (developed by patient)   Nutrition Follow meal plan discussed;General guidelines for healthy choices and portions discussed    Physical Activity Exercise 3-5 times per week;30 minutes per day      Post-Education Assessment   Patient understands the diabetes disease and treatment process. Demonstrates understanding / competency    Patient understands incorporating nutritional management into lifestyle. Demonstrates understanding / competency    Patient undertands incorporating physical activity into lifestyle. Demonstrates understanding / competency    Patient understands using medications safely. Demonstrates understanding / competency    Patient understands monitoring blood glucose, interpreting and using results Demonstrates understanding / competency    Patient understands prevention, detection, and treatment of acute complications. Demonstrates understanding / competency    Patient understands prevention, detection, and treatment of chronic complications. Demonstrates understanding / competency    Patient understands how to develop strategies to address psychosocial issues. Demonstrates understanding / competency    Patient understands how to develop strategies to promote health/change behavior. Demonstrates understanding / competency      Outcomes   Expected Outcomes Demonstrated interest in learning but  significant barriers to change    Future DMSE PRN    Program Status Completed             Individualized Plan for Diabetes Self-Management Training:   Learning Objective:  Patient will have a greater understanding of diabetes self-management. Patient education plan is to attend individual and/or group sessions per assessed needs and concerns.   Plan:   There are no Patient Instructions on file for this visit.  Expected Outcomes:  Demonstrated interest in learning but significant barriers to change  Education material provided: ADA - How to Thrive: A Guide for Your Journey with Diabetes and Carbohydrate counting sheet  If problems or questions, patient to contact team via:  Phone and Email  Future DSME appointment: PRN

## 2022-08-06 ENCOUNTER — Ambulatory Visit
Admission: EM | Admit: 2022-08-06 | Discharge: 2022-08-06 | Disposition: A | Discharge status: Home / Self Care | Payer: Medicare Other | Attending: Urgent Care | Admitting: Urgent Care

## 2022-08-06 DIAGNOSIS — J069 Acute upper respiratory infection, unspecified: Secondary | ICD-10-CM

## 2022-08-06 LAB — POCT RAPID STREP A (OFFICE): Rapid Strep A Screen: NEGATIVE

## 2022-08-06 NOTE — ED Provider Notes (Addendum)
Renaldo Fiddler    CSN: [id number] Arrival date & time: 08/06/22  1558      History   Chief Complaint Chief Complaint  Patient presents with   Sore Throat    HPI Gail Burns is a 77 y.o. female.    Sore Throat    Presents to UC with complaint of cough, HA, sore throat x 3 days. Denies fever.  Past Medical History:  Diagnosis Date   Anxiety    Aorto-iliac atherosclerosis (HCC)    Basal cell carcinoma    face, chest, legs   Benign neoplasm of colon    Coronary atherosclerosis of native coronary artery    Degeneration of lumbar or lumbosacral intervertebral disc    Depression    Diarrhea    Diverticulosis of colon (without mention of hemorrhage)    Family history of adverse reaction to anesthesia    twin sister has hard time waking up from anesthesia   GERD (gastroesophageal reflux disease)    H/O acute pancreatitis    Hyperlipidemia    Hypertension    Impaired glucose tolerance test    Internal hemorrhoids without mention of complication    Macular degeneration    Obesity, unspecified    Osteoarthritis    right knee   Other dyspnea and respiratory abnormality    Other malaise and fatigue    Pancreatitis due to biliary obstruction 08/05/2012   two week admission in [place].  Step down unit.   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   Dyspnea 09/20/2012   Hyponatremia 08/18/2012    Hypoxemia 08/17/2012   Anemia 08/16/2012   GERD (gastroesophageal reflux disease) 08/09/2012   History of pancreatitis 08/09/2012   HTN (hypertension) 08/09/2012   Hypokalemia 08/09/2012   Leukocytosis 08/09/2012   Macular degeneration 08/09/2012   Renal insufficiency 08/09/2012   S/P laparoscopic cholecystectomy 08/08/2012   Abnormal LFTs 08/08/2012   Pure hypercholesterolemia 03/16/2012   Other abnormal glucose 03/16/2012   Essential hypertension, benign 03/16/2012   Coronary artery disease 03/16/2012   Depression 03/16/2012    Past Surgical History:  Procedure Laterality Date   Admission  08/05/2012   Acute pancreatitis. [place]. Two week admission; step down unit.   CATARACT EXTRACTION W/ INTRAOCULAR LENS  IMPLANT, BILATERAL Bilateral 2019   CHOLECYSTECTOMY  2014   colonoscopy with polypectomy     COLONOSCOPY WITH PROPOFOL N/A 06/10/2017   Procedure: COLONOSCOPY WITH PROPOFOL;  Surgeon: Scot Jun, MD;  Location: [facility] ENDOSCOPY;  Service: Endoscopy;  Laterality: N/A;   CORONARY ANGIOPLASTY  2006   80% blockage; PTCA of unknown artery; Location: [place], [place].    dilatation and curettage  x 2   DILATION AND CURETTAGE OF UTERUS     ESOPHAGOGASTRODUODENOSCOPY (EGD) WITH PROPOFOL N/A 06/10/2017   Procedure: ESOPHAGOGASTRODUODENOSCOPY (EGD) WITH PROPOFOL;  Surgeon: Scot Jun, MD;  Location: [facility] ENDOSCOPY;  Service: Endoscopy;  Laterality: N/A;   eye lid surgery Bilateral 2012   EYE SURGERY  per patient's health survey - LASER   KNEE ARTHROPLASTY Right 09/10/2020   Procedure: COMPUTER ASSISTED TOTAL KNEE ARTHROPLASTY;  Surgeon: Donato Heinz, MD;  Location: [facility] ORS;  Service: Orthopedics;  Laterality: Right;   KNEE SURGERY Right 2010   arthroscopic   Laser therapy and injections for macular degeneration     TUBAL LIGATION  1982    OB History   No obstetric history on file.      Home Medications    Prior to Admission medications   Medication Sig Start  Date End Date Taking? Authorizing Provider  acetaminophen (TYLENOL) 500 MG tablet Take 1,000 mg by mouth 2 (two) times daily as needed.   Yes [provider]  calcium carbonate (OS-CAL) 600 MG TABS Take 600 mg by mouth daily with supper.   Yes [provider]  escitalopram (LEXAPRO) 20 MG tablet Take 1 tablet (20 mg total) by mouth daily. 09/10/17  Yes Ethelda Chick, MD  ezetimibe (ZETIA) 10 MG tablet Take 10 mg by mouth daily.   Yes [provider]  fenofibrate (TRICOR) 145 MG tablet Take 145 mg by mouth daily with supper.   Yes [provider]  Misc Natural Products (LUTEIN VISION BLEND PO) Take 1 capsule by mouth daily with supper.   Yes [provider]  Multiple Vitamin (MULTIVITAMIN) tablet Take 1 tablet by mouth daily.   Yes [provider]  omeprazole (PRILOSEC) 20 MG capsule Take 20 mg by mouth daily.   Yes [provider]  OVER THE COUNTER MEDICATION Take 1 capsule by mouth daily. Cell [employer]   Yes [provider]  rosuvastatin (CRESTOR) 40 MG tablet TAKE 1 TABLET DAILY Patient taking differently: Take 40 mg by mouth at bedtime. TAKE 1 TABLET DAILY 06/08/17  Yes Ethelda Chick, MD  celecoxib (CELEBREX) 200 MG capsule Take 1 capsule (200 mg total) by mouth 2 (two) times daily. 09/11/20   Lasandra Beech B, PA-C  enoxaparin (LOVENOX) 40 MG/0.4ML injection Inject 0.4 mLs (40 mg total) into the skin daily for 14 days. 09/11/20 09/25/20  Lasandra Beech B, PA-C  oxyCODONE (OXY IR/ROXICODONE) 5 MG immediate release tablet Take 1 tablet (5 mg total) by mouth every 4 (four) hours as needed for moderate pain (pain score 4-6). 09/11/20   Madelyn Flavors, PA-C    Family History Family History  Problem Relation Age of Onset   Cancer Mother    Heart disease Mother          CAD     has pacemaker   Dementia Mother    Diabetes Mother    Macular degeneration Mother    Heart disease Father    Rheumatic fever Father    Heart disease  Sister 10       CABG   Hyperlipidemia Sister    Stroke Sister 86       TIA's   Heart disease Brother        CABG age 54   COPD Brother    Heart disease Maternal Grandfather    Hyperlipidemia Sister    Hypertension Sister    Arthritis Sister        DDD lumbar s/p surgery   Parkinson's disease Maternal Grandmother    Heart disease Paternal Grandmother    Breast cancer Daughter 26    Social History Social History   Tobacco Use   Smoking status: Former    Packs/day: 0.50    Years: 40.00    Additional pack years:  0.00    Total pack years: 20.00    Types: Cigarettes    Quit date: 02/18/1990    Years since quitting: 32.4   Smokeless tobacco: Never   Tobacco comments:    quit 1991  Vaping Use   Vaping Use: Never used  Substance Use Topics   Alcohol use: Yes    Comment: occassional   Drug use: No     Allergies   Patient has no active allergies.   Review of Systems Review of Systems   Physical Exam Triage Vital Signs ED Triage Vitals  Enc Vitals Group     BP 08/06/22 1615 134/66     Pulse Rate 08/06/22 1615 86     Resp 08/06/22 1615 19     Temp 08/06/22 1615 98.3 F (36.8 C)     Temp Source 08/06/22 1615 Oral     SpO2 08/06/22 1615 95 %     Weight --      Height --      Head Circumference --      Peak Flow --      Pain Score 08/06/22 1616 10     Pain Loc --      Pain Edu? --      Excl. in GC? --    No data found.  Updated Vital Signs BP 134/66 (BP Location: Left Arm)   Pulse 86   Temp 98.3 F (36.8 C) (Oral)   Resp 19   SpO2 95%   Visual Acuity Right Eye Distance:   Left Eye Distance:   Bilateral Distance:    Right Eye Near:   Left Eye Near:    Bilateral Near:     Physical Exam Vitals reviewed.  Constitutional:      Appearance: She is well-developed.  HENT:     Mouth/Throat:     Mouth: Mucous membranes are moist.     Pharynx: Posterior oropharyngeal erythema present. No oropharyngeal exudate.  Cardiovascular:     Rate and Rhythm:  Normal rate and regular rhythm.     Heart sounds: Normal heart sounds.  Pulmonary:     Effort: Pulmonary effort is normal.     Breath sounds: Normal breath sounds.  Skin:    General: Skin is warm.  Neurological:     General: No focal deficit present.     Mental Status: She is alert and oriented to person, place, and time.  Psychiatric:        Mood and Affect: Mood normal.        Behavior: Behavior normal.      UC Treatments / Results  Labs (all labs ordered are listed, but only abnormal results are displayed) Labs Reviewed - No data to display  EKG   Radiology No results found.  Procedures Procedures (including critical care time)  Medications Ordered in UC Medications - No data to display  Initial Impression / Assessment and Plan / UC Course  I have reviewed the triage vital signs and the nursing notes.  Pertinent labs & imaging results that were available during my care of the patient were reviewed by me and considered in my medical decision making (see chart for details).   Gail Burns is a 77 y.o. female presenting with URI symptoms including sore throat. Patient is afebrile without recent antipyretics, satting well on room air. Overall is well appearing and non-toxic, well hydrated, without respiratory distress. Pulmonary exam is unremarkable.  Lungs CTAB without wheezing, rhonchi, rales.  Mild pharyngeal erythema.  No peritonsillar exudates.  Rapid strep is negative.  Patient's symptoms are consistent with an acute viral process.  Recommending supportive care and use of OTC medication for symptom control.  Counseled patient on potential for adverse effects with medications prescribed/recommended today, ER and return-to-clinic precautions discussed, patient verbalized understanding and agreement with care plan.   Final Clinical Impressions(s) / UC Diagnoses   Final diagnoses:  None   Discharge Instructions   None    ED Prescriptions   None    PDMP not  reviewed this encounter.   Charma Igo, FNP 08/06/22 1635    Charma Igo, FNP 08/06/22 [phone]

## 2022-08-06 NOTE — Discharge Instructions (Addendum)
Follow up here or with your primary care provider if your symptoms are worsening or not improving.     

## 2022-08-06 NOTE — ED Triage Notes (Signed)
Cough, headache, sore throat that started 3 days ago. Taking mucinx with no relief.

## 2022-09-25 ENCOUNTER — Other Ambulatory Visit: Payer: Self-pay | Admitting: Family Medicine

## 2022-09-25 DIAGNOSIS — Z1231 Encounter for screening mammogram for malignant neoplasm of breast: Secondary | ICD-10-CM

## 2022-10-27 ENCOUNTER — Ambulatory Visit
Admission: RE | Admit: 2022-10-27 | Discharge: 2022-10-27 | Disposition: A | Discharge status: Home / Self Care | Payer: Medicare Other | Source: Ambulatory Visit | Attending: Family Medicine | Admitting: Family Medicine

## 2022-10-27 DIAGNOSIS — Z1231 Encounter for screening mammogram for malignant neoplasm of breast: Secondary | ICD-10-CM | POA: Diagnosis not present

## 2023-01-29 ENCOUNTER — Ambulatory Visit
Admission: EM | Admit: 2023-01-29 | Discharge: 2023-01-29 | Disposition: A | Discharge status: Home / Self Care | Payer: Medicare Other | Attending: Emergency Medicine | Admitting: Emergency Medicine

## 2023-01-29 DIAGNOSIS — Z23 Encounter for immunization: Secondary | ICD-10-CM

## 2023-01-29 DIAGNOSIS — T24232A Burn of second degree of left lower leg, initial encounter: Secondary | ICD-10-CM

## 2023-01-29 DIAGNOSIS — L03116 Cellulitis of left lower limb: Secondary | ICD-10-CM | POA: Diagnosis not present

## 2023-01-29 MED ORDER — SILVER SULFADIAZINE 1 % EX CREA: 1.0000 | TOPICAL_CREAM | Freq: Every day | CUTANEOUS | 0 refills | Status: AC

## 2023-01-29 MED ORDER — DOXYCYCLINE HYCLATE 100 MG PO CAPS: 100.0000 mg | ORAL_CAPSULE | Freq: Two times a day (BID) | ORAL | 0 refills | Status: AC

## 2023-01-29 MED ORDER — TETANUS-DIPHTH-ACELL PERTUSSIS 5-2.5-18.5 LF-MCG/0.5 IM SUSY
0.5000 mL | PREFILLED_SYRINGE | Freq: Once | INTRAMUSCULAR | Status: AC
  Administered 2023-01-29: 0.5 mL via INTRAMUSCULAR

## 2023-01-29 MED ORDER — SILVER SULFADIAZINE 1 % EX CREA
TOPICAL_CREAM | Freq: Once | CUTANEOUS | Status: AC
  Administered 2023-01-29: 1 via TOPICAL
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
## 2023-01-29 NOTE — Discharge Instructions (Addendum)
Take the doxycycline and apply the Silvadene as directed.    Your tetanus was updated today.    Follow up with your primary care provider tomorrow.  Go to the emergency department if you have worsening symptoms.
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
## 2023-09-01 ENCOUNTER — Encounter: Payer: Self-pay | Admitting: *Deleted

## 2023-09-07 ENCOUNTER — Ambulatory Visit: Admit: 2023-09-07 | Payer: Medicare Other

## 2023-09-07 SURGERY — COLONOSCOPY WITH PROPOFOL
Anesthesia: General

## 2023-09-25 ENCOUNTER — Encounter
Admission: RE | Disposition: A | Discharge status: Home / Self Care | Payer: Self-pay | Source: Home / Self Care | Attending: Gastroenterology

## 2023-09-25 ENCOUNTER — Ambulatory Visit
Admission: RE | Admit: 2023-09-25 | Discharge: 2023-09-25 | Disposition: A | Discharge status: Home / Self Care | Attending: Gastroenterology | Admitting: Gastroenterology

## 2023-09-25 ENCOUNTER — Ambulatory Visit: Admitting: Anesthesiology

## 2023-09-25 ENCOUNTER — Other Ambulatory Visit: Payer: Self-pay

## 2023-09-25 DIAGNOSIS — I129 Hypertensive chronic kidney disease with stage 1 through stage 4 chronic kidney disease, or unspecified chronic kidney disease: Secondary | ICD-10-CM | POA: Insufficient documentation

## 2023-09-25 DIAGNOSIS — I2510 Atherosclerotic heart disease of native coronary artery without angina pectoris: Secondary | ICD-10-CM | POA: Insufficient documentation

## 2023-09-25 DIAGNOSIS — K5730 Diverticulosis of large intestine without perforation or abscess without bleeding: Secondary | ICD-10-CM | POA: Diagnosis not present

## 2023-09-25 DIAGNOSIS — Z1211 Encounter for screening for malignant neoplasm of colon: Secondary | ICD-10-CM | POA: Diagnosis present

## 2023-09-25 DIAGNOSIS — Z791 Long term (current) use of non-steroidal anti-inflammatories (NSAID): Secondary | ICD-10-CM | POA: Insufficient documentation

## 2023-09-25 DIAGNOSIS — E1122 Type 2 diabetes mellitus with diabetic chronic kidney disease: Secondary | ICD-10-CM | POA: Insufficient documentation

## 2023-09-25 DIAGNOSIS — E66813 Obesity, class 3: Secondary | ICD-10-CM | POA: Diagnosis not present

## 2023-09-25 DIAGNOSIS — Z79899 Other long term (current) drug therapy: Secondary | ICD-10-CM | POA: Diagnosis not present

## 2023-09-25 DIAGNOSIS — D122 Benign neoplasm of ascending colon: Secondary | ICD-10-CM | POA: Insufficient documentation

## 2023-09-25 DIAGNOSIS — N1831 Chronic kidney disease, stage 3a: Secondary | ICD-10-CM | POA: Insufficient documentation

## 2023-09-25 DIAGNOSIS — K219 Gastro-esophageal reflux disease without esophagitis: Secondary | ICD-10-CM | POA: Insufficient documentation

## 2023-09-25 DIAGNOSIS — E1151 Type 2 diabetes mellitus with diabetic peripheral angiopathy without gangrene: Secondary | ICD-10-CM | POA: Diagnosis not present

## 2023-09-25 DIAGNOSIS — F419 Anxiety disorder, unspecified: Secondary | ICD-10-CM | POA: Diagnosis not present

## 2023-09-25 DIAGNOSIS — K640 First degree hemorrhoids: Secondary | ICD-10-CM | POA: Insufficient documentation

## 2023-09-25 DIAGNOSIS — Z6838 Body mass index (BMI) 38.0-38.9, adult: Secondary | ICD-10-CM | POA: Insufficient documentation

## 2023-09-25 DIAGNOSIS — D631 Anemia in chronic kidney disease: Secondary | ICD-10-CM | POA: Insufficient documentation

## 2023-09-25 DIAGNOSIS — Z87891 Personal history of nicotine dependence: Secondary | ICD-10-CM | POA: Insufficient documentation

## 2023-09-25 HISTORY — DX: Acute pancreatitis with uninfected necrosis, unspecified: K85.91

## 2023-09-25 HISTORY — DX: Personal history of colon polyps, unspecified: Z86.0100

## 2023-09-25 HISTORY — DX: Presence of intraocular lens: Z96.1

## 2023-09-25 HISTORY — DX: Type 2 diabetes mellitus without complications: E11.9

## 2023-09-25 HISTORY — PX: COLONOSCOPY: SHX5424

## 2023-09-25 HISTORY — DX: Chronic kidney disease, stage 3a: N18.31

## 2023-09-25 HISTORY — PX: POLYPECTOMY: SHX149

## 2023-09-25 LAB — GLUCOSE, CAPILLARY: Glucose-Capillary: 135 mg/dL — ABNORMAL HIGH (ref 70–99)

## 2023-09-25 SURGERY — COLONOSCOPY
Anesthesia: General

## 2023-09-25 MED ORDER — GLYCOPYRROLATE 0.2 MG/ML IJ SOLN
INTRAMUSCULAR | Status: DC | PRN
  Administered 2023-09-25: .2 mg via INTRAVENOUS

## 2023-09-25 MED ORDER — PROPOFOL 10 MG/ML IV BOLUS
INTRAVENOUS | Status: AC
  Filled 2023-09-25: qty 20

## 2023-09-25 MED ORDER — PROPOFOL 500 MG/50ML IV EMUL
INTRAVENOUS | Status: DC | PRN
  Administered 2023-09-25: 125 ug/kg/min via INTRAVENOUS
  Administered 2023-09-25: 50 mg via INTRAVENOUS

## 2023-09-25 MED ORDER — SODIUM CHLORIDE 0.9 % IV SOLN: INTRAVENOUS | Status: DC

## 2023-09-25 MED ORDER — PHENYLEPHRINE 80 MCG/ML (10ML) SYRINGE FOR IV PUSH (FOR BLOOD PRESSURE SUPPORT)
PREFILLED_SYRINGE | INTRAVENOUS | Status: DC | PRN
  Administered 2023-09-25: 80 ug via INTRAVENOUS

## 2023-09-25 MED ORDER — LIDOCAINE HCL (CARDIAC) PF 100 MG/5ML IV SOSY
PREFILLED_SYRINGE | INTRAVENOUS | Status: DC | PRN
  Administered 2023-09-25: 80 mg via INTRAVENOUS

## 2023-09-25 NOTE — Transfer of Care (Signed)
 Immediate Anesthesia Transfer of Care Note  Patient: Gail Burns  Procedure(s) Performed: COLONOSCOPY POLYPECTOMY, INTESTINE  Patient Location: PACU  Anesthesia Type:MAC  Level of Consciousness: awake and alert   Airway & Oxygen  Therapy: Patient Spontanous Breathing  Post-op Assessment: Report given to RN and Post -op Vital signs reviewed and stable  Post vital signs: stable  Last Vitals:  Vitals Value Taken Time  BP 108/56 09/25/23 11:03  Temp 35.8 C 09/25/23 11:02  Pulse    Resp 8 09/25/23 11:03  SpO2    Vitals shown include unfiled device data.  Last Pain:  Vitals:   09/25/23 1102  TempSrc: Tympanic  PainSc: Asleep         Complications: No notable events documented.

## 2023-09-25 NOTE — Anesthesia Preprocedure Evaluation (Addendum)
 Anesthesia Evaluation  Patient identified by MRN, date of birth, ID band Patient awake    Reviewed: Allergy & Precautions, NPO status , Patient's Chart, lab work & pertinent test results  Airway Mallampati: II  TM Distance: >3 FB Neck ROM: full    Dental  (+) Teeth Intact   Pulmonary former smoker   Pulmonary exam normal  + decreased breath sounds      Cardiovascular Exercise Tolerance: Good hypertension, Pt. on medications + CAD  Normal cardiovascular exam Rhythm:Regular Rate:Normal     Neuro/Psych   Anxiety     negative neurological ROS  negative psych ROS   GI/Hepatic negative GI ROS, Neg liver ROS,GERD  ,,  Endo/Other  diabetes, Type 2  Class 3 obesity  Renal/GU CRFRenal disease  negative genitourinary   Musculoskeletal   Abdominal  (+) + obese  Peds negative pediatric ROS (+)  Hematology negative hematology ROS (+) Blood dyscrasia, anemia   Anesthesia Other Findings Past Medical History: No date: Anxiety No date: Aorto-iliac atherosclerosis (HCC) No date: Aorto-iliac atherosclerosis (HCC) No date: Basal cell carcinoma     Comment:  face, chest, legs No date: Benign neoplasm of colon No date: Chronic renal impairment, stage 3a (HCC) No date: Coronary atherosclerosis of native coronary artery No date: Degeneration of lumbar or lumbosacral intervertebral disc No date: Depression No date: Diabetes mellitus without complication (HCC) No date: Diarrhea No date: Diverticulosis of colon (without mention of hemorrhage) No date: Family history of adverse reaction to anesthesia     Comment:  twin sister has hard time waking up from anesthesia No date: GERD (gastroesophageal reflux disease) No date: H/O acute pancreatitis No date: History of colon polyps No date: Hyperlipidemia No date: Hypertension No date: Impaired glucose tolerance test No date: Internal hemorrhoids without mention of complication No  date: Macular degeneration No date: Macular degeneration No date: Obesity, unspecified No date: Osteoarthritis     Comment:  right knee No date: Other dyspnea and respiratory abnormality No date: Other malaise and fatigue 08/05/2012: Pancreatitis due to biliary obstruction     Comment:  two week admission in [place] .  Step down unit. No date: Pseudophakia of left eye No date: Pseudophakia of right eye No date: Seborrheic dermatitis, unspecified No date: Subacute pancreatic necrosis No date: Tobacco use disorder No date: Unspecified disorder of kidney and ureter  Past Surgical History: 08/05/2012: Admission     Comment:  Acute pancreatitis. [place] . Two week admission; step               down unit. 2019: CATARACT EXTRACTION W/ INTRAOCULAR LENS  IMPLANT, BILATERAL;  Bilateral 2014: CHOLECYSTECTOMY No date: colonoscopy with polypectomy 06/10/2017: COLONOSCOPY WITH PROPOFOL ; N/A     Comment:  Procedure: COLONOSCOPY WITH PROPOFOL ;  Surgeon: Cassie Click, MD;  Location: [facility] ENDOSCOPY;  Service:               Endoscopy;  Laterality: N/A; 2006: CORONARY ANGIOPLASTY     Comment:  80% blockage; PTCA of unknown artery; Location:               [place], [place].  x 2: dilatation and curettage No date: DILATION AND CURETTAGE OF UTERUS 06/10/2017: ESOPHAGOGASTRODUODENOSCOPY (EGD) WITH PROPOFOL ; N/A     Comment:  Procedure: ESOPHAGOGASTRODUODENOSCOPY (EGD) WITH               PROPOFOL ;  Surgeon: Cassie Click, MD;  Location:  [facility] ENDOSCOPY;  Service: Endoscopy;  Laterality: N/A; 2012: eye lid surgery; Bilateral No date: EYE SURGERY     Comment:  per patient's health survey - LASER No date: JOINT REPLACEMENT 09/10/2020: KNEE ARTHROPLASTY; Right     Comment:  Procedure: COMPUTER ASSISTED TOTAL KNEE ARTHROPLASTY;                Surgeon: Arlyne Lame, MD;  Location: [facility] ORS;                Service: Orthopedics;  Laterality: Right; 2010: KNEE SURGERY;  Right     Comment:  arthroscopic No date: Laser therapy and injections for macular degeneration 1982: TUBAL LIGATION  BMI    Body Mass Index: 38.64 kg/m      Reproductive/Obstetrics negative OB ROS                             Anesthesia Physical Anesthesia Plan  ASA: 3  Anesthesia Plan: General   Post-op Pain Management:    Induction:   PONV Risk Score and Plan: Propofol  infusion and TIVA  Airway Management Planned: Natural Airway and Nasal Cannula  Additional Equipment:   Intra-op Plan:   Post-operative Plan:   Informed Consent: I have reviewed the patients History and Physical, chart, labs and discussed the procedure including the risks, benefits and alternatives for the proposed anesthesia with the patient or authorized representative who has indicated his/her understanding and acceptance.     Dental Advisory Given  Plan Discussed with: CRNA  Anesthesia Plan Comments:        Anesthesia Quick Evaluation

## 2023-09-25 NOTE — Anesthesia Postprocedure Evaluation (Signed)
 Anesthesia Post Note  Patient: Gail Burns  Procedure(s) Performed: COLONOSCOPY POLYPECTOMY, INTESTINE  Patient location during evaluation: PACU Anesthesia Type: General Level of consciousness: awake and alert Pain management: satisfactory to patient Vital Signs Assessment: post-procedure vital signs reviewed and stable Respiratory status: spontaneous breathing Cardiovascular status: stable Anesthetic complications: no   No notable events documented.   Last Vitals:  Vitals:   09/25/23 1024 09/25/23 1102  BP: (!) 154/67 (!) 108/56  Pulse: 85 75  Resp: 18 12  Temp: (!) 35.8 C (!) 35.8 C  SpO2: 95% 97%    Last Pain:  Vitals:   09/25/23 1102  TempSrc: Tympanic  PainSc: Asleep                 VAN STAVEREN,Tramaine Sauls

## 2023-09-25 NOTE — Op Note (Addendum)
 [facility] Patient Name: Gail Burns Procedure Date: 09/25/2023 10:05 AM MRN: [id number] Account #: [id number] Date of Birth: 11/11/1945 Admit Type: Outpatient Age: 78 Room: [facility] ENDO ROOM 3 Gender: Female Note Status: Finalized Instrument Name: Charlyn Cooley [id number] Procedure:             Colonoscopy Indications:           High risk colon cancer surveillance: Personal history                         of colonic polyps, Last colonoscopy 5 years ago Providers:             Leida Puna MD, MD Referring MD:          Marquis Sitter. Felipe Horton, MD (Referring MD) Medicines:             Monitored Anesthesia Care Complications:         No immediate complications. Estimated blood loss:                         Minimal. Procedure:             Pre-Anesthesia Assessment:                        - Prior to the procedure, a History and Physical was                         performed, and patient medications and allergies were                         reviewed. The patient is competent. The risks and                         benefits of the procedure and the sedation options and                         risks were discussed with the patient. All questions                         were answered and informed consent was obtained.                         Patient identification and proposed procedure were                         verified by the physician, the nurse, the                         anesthesiologist, the anesthetist and the technician                         in the endoscopy suite. Mental Status Examination:                         alert and oriented. Airway Examination: normal                         oropharyngeal airway and neck mobility. Respiratory  Examination: clear to auscultation. CV Examination:                         normal. Prophylactic Antibiotics: The patient does not                         require prophylactic antibiotics. Prior                          Anticoagulants: The patient has taken no anticoagulant                         or antiplatelet agents. ASA Grade Assessment: III - A                         patient with severe systemic disease. After reviewing                         the risks and benefits, the patient was deemed in                         satisfactory condition to undergo the procedure. The                         anesthesia plan was to use monitored anesthesia care                         (MAC). Immediately prior to administration of                         medications, the patient was re-assessed for adequacy                         to receive sedatives. The heart rate, respiratory                         rate, oxygen  saturations, blood pressure, adequacy of                         pulmonary ventilation, and response to care were                         monitored throughout the procedure. The physical                         status of the patient was re-assessed after the                         procedure.                        After obtaining informed consent, the colonoscope was                         passed under direct vision. Throughout the procedure,                         the patient's blood pressure, pulse, and oxygen   saturations were monitored continuously. The                         Colonoscope was introduced through the anus and                         advanced to the the cecum, identified by appendiceal                         orifice and ileocecal valve. The colonoscopy was                         performed without difficulty. The patient tolerated                         the procedure well. The quality of the bowel                         preparation was good. The ileocecal valve, appendiceal                         orifice, and rectum were photographed. Findings:      The perianal and digital rectal examinations were normal.      A 1 mm polyp was found in  the ascending colon. The polyp was sessile.       The polyp was removed with a jumbo cold forceps. Resection and retrieval       were complete. Estimated blood loss was minimal.      Scattered small-mouthed diverticula were found in the transverse colon       and ascending colon.      Multiple small-mouthed diverticula were found in the sigmoid colon and       descending colon.      Internal hemorrhoids were found during retroflexion. The hemorrhoids       were Grade I (internal hemorrhoids that do not prolapse).      The exam was otherwise without abnormality on direct and retroflexion       views. Impression:            - One 1 mm polyp in the ascending colon, removed with                         a jumbo cold forceps. Resected and retrieved.                        - Diverticulosis in the transverse colon and in the                         ascending colon.                        - Diverticulosis in the sigmoid colon and in the                         descending colon.                        - Internal hemorrhoids.                        -  The examination was otherwise normal on direct and                         retroflexion views. Recommendation:        - Discharge patient to home.                        - Resume previous diet.                        - Continue present medications.                        - Await pathology results.                        - Repeat colonoscopy is not recommended due to current                         age (58 years or older) for surveillance.                        - Return to referring physician as previously                         scheduled. Procedure Code(s):     --- Professional ---                        [phone], Colonoscopy, flexible; with biopsy, single or                         multiple Diagnosis Code(s):     --- Professional ---                        Z86.010, Personal history of colonic polyps                        D12.2, Benign neoplasm of  ascending colon                        K64.0, First degree hemorrhoids                        K57.30, Diverticulosis of large intestine without                         perforation or abscess without bleeding CPT copyright 2022 [employer]. All rights reserved. The codes documented in this report are preliminary and upon coder review may  be revised to meet current compliance requirements. Leida Puna MD, MD 09/25/2023 11:02:17 AM Number of Addenda: 0 Note Initiated On: 09/25/2023 10:05 AM Scope Withdrawal Time: 0 hours 7 minutes 35 seconds  Total Procedure Duration: 0 hours 13 minutes 17 seconds  Estimated Blood Loss:  Estimated blood loss was minimal.      [facility]

## 2023-09-25 NOTE — Interval H&P Note (Signed)
 History and Physical Interval Note:  09/25/2023 10:33 AM  Gail Burns  has presented today for surgery, with the diagnosis of h/o colon polyps.  The various methods of treatment have been discussed with the patient and family. After consideration of risks, benefits and other options for treatment, the patient has consented to  Procedure(s) with comments: COLONOSCOPY (N/A) - DM as a surgical intervention.  The patient's history has been reviewed, patient examined, no change in status, stable for surgery.  I have reviewed the patient's chart and labs.  Questions were answered to the patient's satisfaction.     Shane Darling  Ok to proceed with colonoscopy

## 2023-09-25 NOTE — H&P (Signed)
 Outpatient short stay form Pre-procedure 09/25/2023  Gail Darling, MD  Primary Physician: Valere Gata, MD  Reason for visit:  Surveillance  History of present illness:    78 y/o lady with history of hypertension, CAD< HLD, PVD, CKD 3, and DM II here for surveillance colonoscopy for history of polyps. Last colonoscopy in 2019 was unremarkable. No blood thinners. No family history of GI malignancies. History of cholecystectomy and tubal ligation.   No current facility-administered medications for this encounter.  Medications Prior to Admission  Medication Sig Dispense Refill Last Dose/Taking   acetaminophen  (TYLENOL ) 500 MG tablet Take 1,000 mg by mouth 2 (two) times daily as needed.      calcium  carbonate (OS-CAL) 600 MG TABS Take 600 mg by mouth daily with supper.      celecoxib  (CELEBREX ) 200 MG capsule Take 1 capsule (200 mg total) by mouth 2 (two) times daily. 90 capsule 0    enoxaparin  (LOVENOX ) 40 MG/0.4ML injection Inject 0.4 mLs (40 mg total) into the skin daily for 14 days. 5.6 mL 0    escitalopram  (LEXAPRO ) 20 MG tablet Take 1 tablet (20 mg total) by mouth daily. (Patient taking differently: Take 10 mg by mouth daily.) 90 tablet 0    ezetimibe  (ZETIA ) 10 MG tablet Take 10 mg by mouth daily.      fenofibrate  (TRICOR ) 145 MG tablet Take 145 mg by mouth daily with supper.      Misc Natural Products (LUTEIN VISION BLEND PO) Take 1 capsule by mouth daily with supper.      Multiple Vitamin (MULTIVITAMIN) tablet Take 1 tablet by mouth daily.      omeprazole (PRILOSEC) 20 MG capsule Take 20 mg by mouth daily.      OVER THE COUNTER MEDICATION Take 1 capsule by mouth daily. Cell [employer]      oxyCODONE  (OXY IR/ROXICODONE ) 5 MG immediate release tablet Take 1 tablet (5 mg total) by mouth every 4 (four) hours as needed for moderate pain (pain score 4-6). 30 tablet 0    rosuvastatin  (CRESTOR ) 40 MG tablet TAKE 1 TABLET DAILY (Patient taking differently: Take 40 mg by mouth at bedtime.  TAKE 1 TABLET DAILY) 90 tablet 1    silver  sulfADIAZINE  (SILVADENE ) 1 % cream Apply 1 Application topically daily. 50 g 0      No Active Allergies   Past Medical History:  Diagnosis Date   Anxiety    Aorto-iliac atherosclerosis (HCC)    Aorto-iliac atherosclerosis (HCC)    Basal cell carcinoma    face, chest, legs   Benign neoplasm of colon    Chronic renal impairment, stage 3a (HCC)    Coronary atherosclerosis of native coronary artery    Degeneration of lumbar or lumbosacral intervertebral disc    Depression    Diabetes mellitus without complication (HCC)    Diarrhea    Diverticulosis of colon (without mention of hemorrhage)    Family history of adverse reaction to anesthesia    twin sister has hard time waking up from anesthesia   GERD (gastroesophageal reflux disease)    H/O acute pancreatitis    History of colon polyps    Hyperlipidemia    Hypertension    Impaired glucose tolerance test    Internal hemorrhoids without mention of complication    Macular degeneration    Macular degeneration    Obesity, unspecified    Osteoarthritis    right knee   Other dyspnea and respiratory abnormality    Other malaise and  fatigue    Pancreatitis due to biliary obstruction 08/05/2012   two week admission in [place] .  Step down unit.   Pseudophakia of left eye    Pseudophakia of right eye    Seborrheic dermatitis, unspecified    Subacute pancreatic necrosis    Tobacco use disorder    Unspecified disorder of kidney and ureter     Review of systems:  Otherwise negative.    Physical Exam  Gen: Alert, oriented. Appears stated age.  HEENT: PERRLA. Lungs: No respiratory distress CV: RRR Abd: soft, benign, no masses Ext: No edema    Planned procedures: Proceed with colonoscopy. The patient understands the nature of the planned procedure, indications, risks, alternatives and potential complications including but not limited to bleeding, infection, perforation, damage to  internal organs and possible oversedation/side effects from anesthesia. The patient agrees and gives consent to proceed.  Please refer to procedure notes for findings, recommendations and patient disposition/instructions.     Gail Darling, MD [facility]

## 2023-09-28 LAB — SURGICAL PATHOLOGY

## 2023-09-30 ENCOUNTER — Other Ambulatory Visit: Payer: Self-pay | Admitting: Family Medicine

## 2023-09-30 DIAGNOSIS — Z1231 Encounter for screening mammogram for malignant neoplasm of breast: Secondary | ICD-10-CM

## 2023-10-28 ENCOUNTER — Ambulatory Visit
Admission: RE | Admit: 2023-10-28 | Discharge: 2023-10-28 | Disposition: A | Discharge status: Home / Self Care | Source: Ambulatory Visit | Attending: Family Medicine | Admitting: Family Medicine

## 2023-10-28 DIAGNOSIS — Z1231 Encounter for screening mammogram for malignant neoplasm of breast: Secondary | ICD-10-CM | POA: Diagnosis present
